# Patient Record
Sex: Female | Born: 1945 | Race: White | Hispanic: No | Smoking: Current every day smoker
Health system: Southern US, Community
[De-identification: ages and names within clinical notes are randomized; demographics above are authoritative.]

## PROBLEM LIST (undated history)

## (undated) DIAGNOSIS — I779 Disorder of arteries and arterioles, unspecified: Secondary | ICD-10-CM

## (undated) DIAGNOSIS — I2510 Atherosclerotic heart disease of native coronary artery without angina pectoris: Secondary | ICD-10-CM

## (undated) DIAGNOSIS — G4733 Obstructive sleep apnea (adult) (pediatric): Secondary | ICD-10-CM

## (undated) DIAGNOSIS — I10 Essential (primary) hypertension: Secondary | ICD-10-CM

## (undated) DIAGNOSIS — J449 Chronic obstructive pulmonary disease, unspecified: Secondary | ICD-10-CM

## (undated) DIAGNOSIS — N182 Chronic kidney disease, stage 2 (mild): Secondary | ICD-10-CM

## (undated) DIAGNOSIS — I5189 Other ill-defined heart diseases: Secondary | ICD-10-CM

## (undated) DIAGNOSIS — M1990 Unspecified osteoarthritis, unspecified site: Secondary | ICD-10-CM

## (undated) DIAGNOSIS — K429 Umbilical hernia without obstruction or gangrene: Secondary | ICD-10-CM

## (undated) DIAGNOSIS — G5602 Carpal tunnel syndrome, left upper limb: Secondary | ICD-10-CM

## (undated) DIAGNOSIS — H44001 Unspecified purulent endophthalmitis, right eye: Secondary | ICD-10-CM

## (undated) DIAGNOSIS — E039 Hypothyroidism, unspecified: Secondary | ICD-10-CM

## (undated) DIAGNOSIS — K219 Gastro-esophageal reflux disease without esophagitis: Secondary | ICD-10-CM

## (undated) DIAGNOSIS — F32A Depression, unspecified: Secondary | ICD-10-CM

## (undated) DIAGNOSIS — E785 Hyperlipidemia, unspecified: Secondary | ICD-10-CM

## (undated) DIAGNOSIS — N189 Chronic kidney disease, unspecified: Secondary | ICD-10-CM

## (undated) DIAGNOSIS — I700 Atherosclerosis of aorta: Secondary | ICD-10-CM

## (undated) DIAGNOSIS — R7303 Prediabetes: Secondary | ICD-10-CM

## (undated) DIAGNOSIS — G2581 Restless legs syndrome: Secondary | ICD-10-CM

## (undated) DIAGNOSIS — F329 Major depressive disorder, single episode, unspecified: Secondary | ICD-10-CM

## (undated) DIAGNOSIS — Z789 Other specified health status: Secondary | ICD-10-CM

## (undated) DIAGNOSIS — M797 Fibromyalgia: Secondary | ICD-10-CM

## (undated) HISTORY — DX: Hyperlipidemia, unspecified: E78.5

## (undated) HISTORY — DX: Depression, unspecified: F32.A

## (undated) HISTORY — DX: Major depressive disorder, single episode, unspecified: F32.9

## (undated) HISTORY — PX: HERNIA REPAIR: SHX51

## (undated) HISTORY — DX: Unspecified osteoarthritis, unspecified site: M19.90

## (undated) HISTORY — PX: CHOLECYSTECTOMY: SHX55

## (undated) HISTORY — PX: COLONOSCOPY: SHX174

## (undated) HISTORY — DX: Essential (primary) hypertension: I10

## (undated) HISTORY — PX: TONSILLECTOMY: SUR1361

## (undated) HISTORY — PX: APPENDECTOMY: SHX54

## (undated) HISTORY — DX: Gastro-esophageal reflux disease without esophagitis: K21.9

## (undated) HISTORY — DX: Chronic kidney disease, unspecified: N18.9

## (undated) HISTORY — PX: ABDOMINAL HYSTERECTOMY: SHX81

---

## 2012-01-12 ENCOUNTER — Ambulatory Visit: Payer: Self-pay | Admitting: Internal Medicine

## 2013-05-27 ENCOUNTER — Ambulatory Visit: Payer: Self-pay | Admitting: Family Medicine

## 2015-05-17 DIAGNOSIS — I10 Essential (primary) hypertension: Secondary | ICD-10-CM | POA: Insufficient documentation

## 2015-05-17 DIAGNOSIS — E039 Hypothyroidism, unspecified: Secondary | ICD-10-CM | POA: Insufficient documentation

## 2015-05-17 DIAGNOSIS — F329 Major depressive disorder, single episode, unspecified: Secondary | ICD-10-CM | POA: Insufficient documentation

## 2015-05-17 DIAGNOSIS — E785 Hyperlipidemia, unspecified: Secondary | ICD-10-CM | POA: Insufficient documentation

## 2015-05-17 DIAGNOSIS — F32A Depression, unspecified: Secondary | ICD-10-CM | POA: Insufficient documentation

## 2015-05-18 ENCOUNTER — Encounter: Payer: Self-pay | Admitting: Family Medicine

## 2015-05-18 ENCOUNTER — Ambulatory Visit (INDEPENDENT_AMBULATORY_CARE_PROVIDER_SITE_OTHER): Payer: Medicare Other | Admitting: Family Medicine

## 2015-05-18 VITALS — BP 117/73 | HR 85 | Temp 98.5°F | Ht 64.0 in | Wt 162.0 lb

## 2015-05-18 DIAGNOSIS — G4733 Obstructive sleep apnea (adult) (pediatric): Secondary | ICD-10-CM | POA: Insufficient documentation

## 2015-05-18 DIAGNOSIS — Z9989 Dependence on other enabling machines and devices: Secondary | ICD-10-CM

## 2015-05-18 DIAGNOSIS — I10 Essential (primary) hypertension: Secondary | ICD-10-CM | POA: Diagnosis not present

## 2015-05-18 DIAGNOSIS — F329 Major depressive disorder, single episode, unspecified: Secondary | ICD-10-CM

## 2015-05-18 DIAGNOSIS — F32A Depression, unspecified: Secondary | ICD-10-CM

## 2015-05-18 NOTE — Progress Notes (Signed)
   BP 117/73 mmHg  Pulse 85  Temp(Src) 98.5 F (36.9 C)  Ht 5\' 4"  (1.626 m)  Wt 162 lb (73.483 kg)  BMI 27.79 kg/m2  SpO2 95%   Subjective:    Patient ID: Sherri Peters, female    DOB: October 01, 1946, 69 y.o.   MRN: [id number]  HPI: Sherri Peters is a 69 y.o. female  Chief Complaint  Patient presents with  . Hypertension  chronic headaches resolved with tx of sleep apnea Not taking any headache meds at all Is having some artheritis c/o Using cpap every night  Relevant past medical, surgical, family and social history reviewed and updated as indicated. Interim medical history since our last visit reviewed. Allergies and medications reviewed and updated.  Review of Systems  Per HPI unless specifically indicated above     Objective:    BP 117/73 mmHg  Pulse 85  Temp(Src) 98.5 F (36.9 C)  Ht 5\' 4"  (1.626 m)  Wt 162 lb (73.483 kg)  BMI 27.79 kg/m2  SpO2 95%  Wt Readings from Last 3 Encounters:  05/18/15 162 lb (73.483 kg)  02/11/15 155 lb (70.308 kg)    Physical Exam  No results found for this or any previous visit.    Assessment & Plan:   Problem List Items Addressed This Visit      Respiratory   OSA on CPAP     Other   Depression    The current medical regimen is effective;  continue present plan and medications.        Other Visit Diagnoses    Essential hypertension, benign    -  Primary    Relevant Orders    Basic metabolic panel        Follow up plan: Return in about 3 months (around 08/18/2015) for Physical Exam.

## 2015-05-18 NOTE — Assessment & Plan Note (Signed)
The current medical regimen is effective;  continue present plan and medications.  

## 2015-05-19 LAB — BASIC METABOLIC PANEL
BUN/Creatinine Ratio: 16 (ref 11–26)
BUN: 20 mg/dL (ref 8–27)
CHLORIDE: 102 mmol/L (ref 97–108)
CO2: 22 mmol/L (ref 18–29)
Calcium: 9.4 mg/dL (ref 8.7–10.3)
Creatinine, Ser: 1.24 mg/dL — ABNORMAL HIGH (ref 0.57–1.00)
GFR calc non Af Amer: 45 mL/min/{1.73_m2} — ABNORMAL LOW (ref >59)
GFR, EST AFRICAN AMERICAN: 52 mL/min/{1.73_m2} — AB (ref >59)
Glucose: 111 mg/dL — ABNORMAL HIGH (ref 65–99)
Potassium: 4.1 mmol/L (ref 3.5–5.2)
Sodium: 140 mmol/L (ref 134–144)

## 2015-05-21 ENCOUNTER — Other Ambulatory Visit: Payer: Self-pay | Admitting: Family Medicine

## 2015-05-24 ENCOUNTER — Telehealth: Payer: Self-pay | Admitting: Family Medicine

## 2015-05-24 MED ORDER — TELMISARTAN-HCTZ 80-12.5 MG PO TABS: 1.0000 | ORAL_TABLET | Freq: Every day | ORAL | Status: DC

## 2015-05-24 NOTE — Telephone Encounter (Signed)
E-fax came through for refill: Rx: telmisartan-hydrochlorothiazide (MICARDIS HCT) 80-12.5 MG per tablet Copy in basket

## 2015-05-25 ENCOUNTER — Telehealth: Payer: Self-pay

## 2015-05-25 MED ORDER — ATORVASTATIN CALCIUM 40 MG PO TABS: 40.0000 mg | ORAL_TABLET | Freq: Every day | ORAL | Status: DC

## 2015-05-25 NOTE — Telephone Encounter (Signed)
Requesting Atorvastatin  Tab QHS #90

## 2015-08-05 ENCOUNTER — Other Ambulatory Visit: Payer: Self-pay | Admitting: Family Medicine

## 2015-08-10 ENCOUNTER — Telehealth: Payer: Self-pay

## 2015-08-10 NOTE — Telephone Encounter (Signed)
[facility] requesting 90 day rx Omeprazole Cap 

## 2015-08-11 MED ORDER — OMEPRAZOLE 20 MG PO CPDR: 20.0000 mg | DELAYED_RELEASE_CAPSULE | Freq: Every day | ORAL | Status: DC

## 2015-08-17 ENCOUNTER — Encounter: Payer: Self-pay | Admitting: Family Medicine

## 2015-08-17 ENCOUNTER — Ambulatory Visit (INDEPENDENT_AMBULATORY_CARE_PROVIDER_SITE_OTHER): Payer: Medicare Other | Admitting: Family Medicine

## 2015-08-17 VITALS — BP 98/62 | HR 80 | Temp 99.0°F | Ht 63.0 in | Wt 167.0 lb

## 2015-08-17 DIAGNOSIS — F32A Depression, unspecified: Secondary | ICD-10-CM

## 2015-08-17 DIAGNOSIS — E785 Hyperlipidemia, unspecified: Secondary | ICD-10-CM

## 2015-08-17 DIAGNOSIS — F329 Major depressive disorder, single episode, unspecified: Secondary | ICD-10-CM | POA: Diagnosis not present

## 2015-08-17 DIAGNOSIS — I10 Essential (primary) hypertension: Secondary | ICD-10-CM

## 2015-08-17 DIAGNOSIS — Z0000 Encounter for general adult medical examination without abnormal findings: Secondary | ICD-10-CM

## 2015-08-17 DIAGNOSIS — E039 Hypothyroidism, unspecified: Secondary | ICD-10-CM

## 2015-08-17 DIAGNOSIS — Z23 Encounter for immunization: Secondary | ICD-10-CM

## 2015-08-17 LAB — URINALYSIS, ROUTINE W REFLEX MICROSCOPIC
BILIRUBIN UA: NEGATIVE
Glucose, UA: NEGATIVE
KETONES UA: NEGATIVE
LEUKOCYTES UA: NEGATIVE
NITRITE UA: NEGATIVE
PH UA: 5 (ref 5.0–7.5)
PROTEIN UA: NEGATIVE
RBC UA: NEGATIVE
SPEC GRAV UA: 1.015 (ref 1.005–1.030)
Urobilinogen, Ur: 0.2 mg/dL (ref 0.2–1.0)

## 2015-08-17 MED ORDER — CITALOPRAM HYDROBROMIDE 40 MG PO TABS: 40.0000 mg | ORAL_TABLET | Freq: Every day | ORAL | Status: DC

## 2015-08-17 MED ORDER — TELMISARTAN-HCTZ 80-12.5 MG PO TABS: ORAL_TABLET | ORAL | Status: DC

## 2015-08-17 MED ORDER — ATORVASTATIN CALCIUM 40 MG PO TABS: 40.0000 mg | ORAL_TABLET | Freq: Every day | ORAL | Status: DC

## 2015-08-17 MED ORDER — LEVOTHYROXINE SODIUM 75 MCG PO TABS: 75.0000 ug | ORAL_TABLET | Freq: Every day | ORAL | Status: DC

## 2015-08-17 NOTE — Assessment & Plan Note (Signed)
The current medical regimen is effective;  continue present plan and medications.  

## 2015-08-17 NOTE — Progress Notes (Signed)
BP 98/62 mmHg  Pulse 80  Temp(Src) 99 F (37.2 C)  Ht  (1.6 m)  Wt 167 lb (75.751 kg)  BMI 29.59 kg/m2  SpO2 96%   Subjective:    Patient ID: Sherri Peters, female    DOB: [birth year], 69 y.o.   MRN: [id number]  HPI: Sherri Peters is a 69 y.o. female  Chief Complaint  Patient presents with  . Annual Exam  AWV metrics meet Asian primarily concerned about some right facial and right ear symptoms of stopped up and some pain has been ongoing for really this morning with some soreness developing yesterday. No trauma or other irritation. Other medical issues and medications doing well taking medications as listed in med sheet without problems and taken faithfully. Nerves depression doing well allergies well controlled thyroid no complaints or symptoms blood pressure cholesterol all doing well.   Relevant past medical, surgical, family and social history reviewed and updated as indicated. Interim medical history since our last visit reviewed. Allergies and medications reviewed and updated.  Review of Systems  Constitutional: Negative.   HENT: Negative.   Eyes: Negative.   Respiratory: Negative.   Cardiovascular: Negative.   Gastrointestinal: Negative.   Endocrine: Negative.   Genitourinary: Negative.   Musculoskeletal: Negative.   Skin: Negative.   Allergic/Immunologic: Negative.   Neurological: Negative.   Hematological: Negative.   Psychiatric/Behavioral: Negative.     Per HPI unless specifically indicated above     Objective:    BP 98/62 mmHg  Pulse 80  Temp(Src) 99 F (37.2 C)  Ht  (1.6 m)  Wt 167 lb (75.751 kg)  BMI 29.59 kg/m2  SpO2 96%  Wt Readings from Last 3 Encounters:  08/17/15 167 lb (75.751 kg)  05/18/15 162 lb (73.483 kg)  02/11/15 155 lb (70.308 kg)    Physical Exam  Constitutional: She is oriented to person, place, and time. She appears well-developed and well-nourished.  HENT:  Head: Normocephalic and atraumatic.  Right Ear: External  ear normal.  Left Ear: External ear normal.  Nose: Nose normal.  Mouth/Throat: Oropharynx is clear and moist.  Eyes: Conjunctivae and EOM are normal. Pupils are equal, round, and reactive to light.  Neck: Normal range of motion. Neck supple. Carotid bruit is not present.  Cardiovascular: Normal rate, regular rhythm and normal heart sounds.   No murmur heard. Pulmonary/Chest: Effort normal and breath sounds normal.  Breast exam refused by pt as she does her own  Abdominal: Soft. Bowel sounds are normal. There is no hepatosplenomegaly.  Musculoskeletal: Normal range of motion.  Neurological: She is alert and oriented to person, place, and time.  Skin: No rash noted.  Psychiatric: She has a normal mood and affect. Her behavior is normal. Judgment and thought content normal.    Results for orders placed or performed in visit on 05/18/15  Basic metabolic panel  Result Value Ref Range   Glucose 111 (H) 65 - 99 mg/dL   BUN 20 8 - 27 mg/dL   Creatinine, Ser 4.09 (H) 0.57 - 1.00 mg/dL   GFR calc non Af Amer 45 (L) >59 mL/min/1.73   GFR calc Af Amer 52 (L) >59 mL/min/1.73   BUN/Creatinine Ratio 16 11 - 26   Sodium 140 134 - 144 mmol/L   Potassium 4.1 3.5 - 5.2 mmol/L   Chloride 102 97 - 108 mmol/L   CO2 22 18 - 29 mmol/L   Calcium 9.4 8.7 - 10.3 mg/dL      Assessment &  Plan:   Problem List Items Addressed This Visit      Cardiovascular and Mediastinum   Hypertension    The current medical regimen is effective;  continue present plan and medications.       Relevant Medications   atorvastatin (LIPITOR) 40 MG tablet   telmisartan-hydrochlorothiazide (MICARDIS HCT) 80-12.5 MG tablet   Other Relevant Orders   Comprehensive metabolic panel   CBC with Differential/Platelet   Urinalysis, Routine w reflex microscopic (not at [facility])     Endocrine   Hypothyroid    The current medical regimen is effective;  continue present plan and medications.       Relevant Medications    levothyroxine (SYNTHROID, LEVOTHROID) 75 MCG tablet   Other Relevant Orders   Comprehensive metabolic panel   CBC with Differential/Platelet   Urinalysis, Routine w reflex microscopic (not at [facility])   TSH     Other   Hyperlipidemia    The current medical regimen is effective;  continue present plan and medications.       Relevant Medications   atorvastatin (LIPITOR) 40 MG tablet   telmisartan-hydrochlorothiazide (MICARDIS HCT) 80-12.5 MG tablet   Other Relevant Orders   Comprehensive metabolic panel   Lipid panel   Depression    The current medical regimen is effective;  continue present plan and medications.       Relevant Medications   citalopram (CELEXA) 40 MG tablet   Other Relevant Orders   Comprehensive metabolic panel   CBC with Differential/Platelet   Urinalysis, Routine w reflex microscopic (not at [facility])    Other Visit Diagnoses    Immunization due    -  Primary    Relevant Orders    Flu Vaccine QUAD 36+ mos PF IM (Fluarix & Fluzone Quad PF) (Completed)    PE (physical exam), annual            Follow up plan: Return in about 6 months (around 02/15/2016), or if symptoms worsen or fail to improve, for Follow-up medication with BMP, lipid panel, ALT, AST.Marland Kitchen

## 2015-08-18 LAB — CBC WITH DIFFERENTIAL/PLATELET
Basophils Absolute: 0 10*3/uL (ref 0.0–0.2)
Basos: 0 %
EOS (ABSOLUTE): 0.1 10*3/uL (ref 0.0–0.4)
Eos: 1 %
Hematocrit: 39.1 % (ref 34.0–46.6)
Hemoglobin: 12.9 g/dL (ref 11.1–15.9)
IMMATURE GRANULOCYTES: 0 %
Immature Grans (Abs): 0 10*3/uL (ref 0.0–0.1)
LYMPHS ABS: 1.9 10*3/uL (ref 0.7–3.1)
Lymphs: 33 %
MCH: 30.6 pg (ref 26.6–33.0)
MCHC: 33 g/dL (ref 31.5–35.7)
MCV: 93 fL (ref 79–97)
MONOS ABS: 0.6 10*3/uL (ref 0.1–0.9)
Monocytes: 11 %
NEUTROS PCT: 55 %
Neutrophils Absolute: 3.2 10*3/uL (ref 1.4–7.0)
PLATELETS: 247 10*3/uL (ref 150–379)
RBC: 4.21 x10E6/uL (ref 3.77–5.28)
RDW: 14.6 % (ref 12.3–15.4)
WBC: 5.8 10*3/uL (ref 3.4–10.8)

## 2015-08-18 LAB — TSH: TSH: 2.2 u[IU]/mL (ref 0.450–4.500)

## 2015-08-18 LAB — COMPREHENSIVE METABOLIC PANEL
A/G RATIO: 2.1 (ref 1.1–2.5)
ALT: 23 IU/L (ref 0–32)
AST: 24 IU/L (ref 0–40)
Albumin: 4.4 g/dL (ref 3.6–4.8)
Alkaline Phosphatase: 61 IU/L (ref 39–117)
BUN / CREAT RATIO: 14 (ref 11–26)
BUN: 21 mg/dL (ref 8–27)
Bilirubin Total: <0.2 mg/dL (ref 0.0–1.2)
CALCIUM: 9.9 mg/dL (ref 8.7–10.3)
CO2: 23 mmol/L (ref 18–29)
Chloride: 101 mmol/L (ref 97–108)
Creatinine, Ser: 1.45 mg/dL — ABNORMAL HIGH (ref 0.57–1.00)
GFR, EST AFRICAN AMERICAN: 43 mL/min/{1.73_m2} — AB (ref >59)
GFR, EST NON AFRICAN AMERICAN: 37 mL/min/{1.73_m2} — AB (ref >59)
GLUCOSE: 102 mg/dL — AB (ref 65–99)
Globulin, Total: 2.1 g/dL (ref 1.5–4.5)
Potassium: 4.5 mmol/L (ref 3.5–5.2)
Sodium: 139 mmol/L (ref 134–144)
TOTAL PROTEIN: 6.5 g/dL (ref 6.0–8.5)

## 2015-08-18 LAB — LIPID PANEL
Chol/HDL Ratio: 2.9 ratio units (ref 0.0–4.4)
Cholesterol, Total: 152 mg/dL (ref 100–199)
HDL: 52 mg/dL (ref >39)
LDL CALC: 61 mg/dL (ref 0–99)
Triglycerides: 195 mg/dL — ABNORMAL HIGH (ref 0–149)
VLDL CHOLESTEROL CAL: 39 mg/dL (ref 5–40)

## 2015-08-19 ENCOUNTER — Telehealth: Payer: Self-pay | Admitting: Family Medicine

## 2015-08-19 ENCOUNTER — Encounter: Payer: Self-pay | Admitting: Family Medicine

## 2015-08-19 DIAGNOSIS — N183 Chronic kidney disease, stage 3 (moderate): Secondary | ICD-10-CM

## 2015-08-19 NOTE — Telephone Encounter (Signed)
-----   Message from Lurlean HornsNancy H Wilson, CMA sent at 08/19/2015  5:06 PM EDT ----- labs

## 2015-08-19 NOTE — Telephone Encounter (Signed)
Phone call Discussed with patient declining renal function. Patient has been using Advil and Aleve aspirin patient will stop using these medications recheck BMP in 1 month or so

## 2015-08-23 ENCOUNTER — Encounter: Payer: Self-pay | Admitting: Family Medicine

## 2015-08-23 ENCOUNTER — Telehealth: Payer: Self-pay | Admitting: Family Medicine

## 2015-08-23 ENCOUNTER — Ambulatory Visit (INDEPENDENT_AMBULATORY_CARE_PROVIDER_SITE_OTHER): Payer: Medicare Other | Admitting: Family Medicine

## 2015-08-23 VITALS — BP 114/71 | HR 84 | Temp 98.8°F | Ht 63.3 in | Wt 167.0 lb

## 2015-08-23 DIAGNOSIS — B029 Zoster without complications: Secondary | ICD-10-CM | POA: Diagnosis not present

## 2015-08-23 MED ORDER — VALACYCLOVIR HCL 1 G PO TABS: 1000.0000 mg | ORAL_TABLET | Freq: Three times a day (TID) | ORAL | Status: DC

## 2015-08-23 NOTE — Telephone Encounter (Signed)
Pt added to MAC's schedule @ 10:30am today for an acute visit. Thanks.

## 2015-08-23 NOTE — Progress Notes (Signed)
BP 114/71 mmHg  Pulse 84  Temp(Src) 98.8 F (37.1 C)  Ht 5' 3.3" (1.608 m)  Wt 167 lb (75.751 kg)  BMI 29.30 kg/m2  SpO2 95%   Subjective:    Patient ID: Sherri Peters, female    DOB: Feb 28, 1946, 69 y.o.   MRN: [id number]  HPI: Sherri Peters is a 69 y.o. female  Chief Complaint  Patient presents with  . Rash    possible shingles   patient developed rash in area of her ear about 2 days after last visit. Started his big welts and inflamed patches around the ear and into scalp. The discomfort has continued. As has the rash is gotten more prominent and red. There are no water blisters or crusting at this point.  Patient has a history of taking prednisone and Levaquin for upper respiratory infection and having hallucinations. Severe reluctant because of potential problems with prednisone to take any further prednisone.   Relevant past medical, surgical, family and social history reviewed and updated as indicated. Interim medical history since our last visit reviewed. Allergies and medications reviewed and updated.  Review of Systems  Constitutional:       All in all just feels bad  HENT:       Shooting pain into her right neck and around ear  Respiratory: Negative.   Cardiovascular: Negative.     Per HPI unless specifically indicated above     Objective:    BP 114/71 mmHg  Pulse 84  Temp(Src) 98.8 F (37.1 C)  Ht 5' 3.3" (1.608 m)  Wt 167 lb (75.751 kg)  BMI 29.30 kg/m2  SpO2 95%  Wt Readings from Last 3 Encounters:  08/23/15 167 lb (75.751 kg)  08/17/15 167 lb (75.751 kg)  05/18/15 162 lb (73.483 kg)    Physical Exam  Constitutional: She is oriented to person, place, and time. She appears well-developed and well-nourished. No distress.  HENT:  Head: Normocephalic and atraumatic.  Right Ear: Hearing normal.  Left Ear: Hearing normal.  Nose: Nose normal.  Eyes: Conjunctivae and lids are normal. Right eye exhibits no discharge. Left eye exhibits no discharge. No  scleral icterus.  Pulmonary/Chest: Effort normal. No respiratory distress.  Musculoskeletal: Normal range of motion.  Neurological: She is alert and oriented to person, place, and time.  Skin: Skin is intact. No rash noted.  Around right ear in front is a red patch posterior ear and scalp area red patch with no blistering  Psychiatric: She has a normal mood and affect. Her speech is normal and behavior is normal. Judgment and thought content normal. Cognition and memory are normal.    Results for orders placed or performed in visit on 08/17/15  Comprehensive metabolic panel  Result Value Ref Range   Glucose 102 (H) 65 - 99 mg/dL   BUN 21 8 - 27 mg/dL   Creatinine, Ser 0.861.45 (H) 0.57 - 1.00 mg/dL   GFR calc non Af Amer 37 (L) >59 mL/min/1.73   GFR calc Af Amer 43 (L) >59 mL/min/1.73   BUN/Creatinine Ratio 14 11 - 26   Sodium 139 134 - 144 mmol/L   Potassium 4.5 3.5 - 5.2 mmol/L   Chloride 101 97 - 108 mmol/L   CO2 23 18 - 29 mmol/L   Calcium 9.9 8.7 - 10.3 mg/dL   Total Protein 6.5 6.0 - 8.5 g/dL   Albumin 4.4 3.6 - 4.8 g/dL   Globulin, Total 2.1 1.5 - 4.5 g/dL   Albumin/Globulin Ratio 2.1 1.1 -  2.5   Bilirubin Total <0.2 0.0 - 1.2 mg/dL   Alkaline Phosphatase 61 39 - 117 IU/L   AST 24 0 - 40 IU/L   ALT 23 0 - 32 IU/L  Lipid panel  Result Value Ref Range   Cholesterol, Total 152 100 - 199 mg/dL   Triglycerides 161 (H) 0 - 149 mg/dL   HDL 52 >09 mg/dL   VLDL Cholesterol Cal 39 5 - 40 mg/dL   LDL Calculated 61 0 - 99 mg/dL   Chol/HDL Ratio 2.9 0.0 - 4.4 ratio units  CBC with Differential/Platelet  Result Value Ref Range   WBC 5.8 3.4 - 10.8 x10E3/uL   RBC 4.21 3.77 - 5.28 x10E6/uL   Hemoglobin 12.9 11.1 - 15.9 g/dL   Hematocrit 60.4 54.0 - 46.6 %   MCV 93 79 - 97 fL   MCH 30.6 26.6 - 33.0 pg   MCHC 33.0 31.5 - 35.7 g/dL   RDW 98.1 19.1 - 47.8 %   Platelets 247 150 - 379 x10E3/uL   Neutrophils 55 %   Lymphs 33 %   Monocytes 11 %   Eos 1 %   Basos 0 %   Neutrophils  Absolute 3.2 1.4 - 7.0 x10E3/uL   Lymphocytes Absolute 1.9 0.7 - 3.1 x10E3/uL   Monocytes Absolute 0.6 0.1 - 0.9 x10E3/uL   EOS (ABSOLUTE) 0.1 0.0 - 0.4 x10E3/uL   Basophils Absolute 0.0 0.0 - 0.2 x10E3/uL   Immature Granulocytes 0 %   Immature Grans (Abs) 0.0 0.0 - 0.1 x10E3/uL  Urinalysis, Routine w reflex microscopic (not at [facility])  Result Value Ref Range   Specific Gravity, UA 1.015 1.005 - 1.030   pH, UA 5.0 5.0 - 7.5   Color, UA Yellow Yellow   Appearance Ur Cloudy (A) Clear   Leukocytes, UA Negative Negative   Protein, UA Negative Negative/Trace   Glucose, UA Negative Negative   Ketones, UA Negative Negative   RBC, UA Negative Negative   Bilirubin, UA Negative Negative   Urobilinogen, Ur 0.2 0.2 - 1.0 mg/dL   Nitrite, UA Negative Negative  TSH  Result Value Ref Range   TSH 2.200 0.450 - 4.500 uIU/mL      Assessment & Plan:   Problem List Items Addressed This Visit      Other   Shingles - Primary    Discussed shingles care and treatment. That she is contagious for chickenpox and avoid pregnant women and children. To be sure and wash her hands after touching her scalp and rash area. Discussed treatment will not use prednisone because of history of possible allergic reaction to prednisone. We will use acyclovir but understands its late in the process and Radilla not be very effective. Patient has continued problems with discomfort [place] need referral to pain clinic.       Relevant Medications   valACYclovir (VALTREX) 1000 MG tablet       Follow up plan: Return if symptoms worsen or fail to improve, for As scheduled and if problems.Marland Kitchen

## 2015-08-23 NOTE — Assessment & Plan Note (Signed)
Discussed shingles care and treatment. That she is contagious for chickenpox and avoid pregnant women and children. To be sure and wash her hands after touching her scalp and rash area. Discussed treatment will not use prednisone because of history of possible allergic reaction to prednisone. We will use acyclovir but understands its late in the process and [place] not be very effective. Patient has continued problems with discomfort [place] need referral to pain clinic.

## 2015-10-11 ENCOUNTER — Telehealth: Payer: Self-pay | Admitting: Family Medicine

## 2015-10-11 NOTE — Telephone Encounter (Signed)
Pt says MAC wanted to recheck kidney function but she wasn't sure if she needed a actual appt with him or just blood work.

## 2015-10-18 ENCOUNTER — Other Ambulatory Visit: Payer: Medicare Other

## 2015-10-18 DIAGNOSIS — N183 Chronic kidney disease, stage 3 (moderate): Secondary | ICD-10-CM

## 2015-10-19 ENCOUNTER — Encounter: Payer: Self-pay | Admitting: Family Medicine

## 2015-10-19 LAB — BASIC METABOLIC PANEL
BUN / CREAT RATIO: 16 (ref 11–26)
BUN: 18 mg/dL (ref 8–27)
CALCIUM: 9 mg/dL (ref 8.7–10.3)
CHLORIDE: 103 mmol/L (ref 96–106)
CO2: 21 mmol/L (ref 18–29)
CREATININE: 1.12 mg/dL — AB (ref 0.57–1.00)
GFR, EST AFRICAN AMERICAN: 59 mL/min/{1.73_m2} — AB (ref >59)
GFR, EST NON AFRICAN AMERICAN: 51 mL/min/{1.73_m2} — AB (ref >59)
Glucose: 101 mg/dL — ABNORMAL HIGH (ref 65–99)
POTASSIUM: 4.1 mmol/L (ref 3.5–5.2)
SODIUM: 141 mmol/L (ref 134–144)

## 2015-12-14 ENCOUNTER — Ambulatory Visit (INDEPENDENT_AMBULATORY_CARE_PROVIDER_SITE_OTHER): Payer: Medicare Other | Admitting: Family Medicine

## 2015-12-14 ENCOUNTER — Encounter: Payer: Self-pay | Admitting: Family Medicine

## 2015-12-14 VITALS — BP 129/72 | HR 66 | Temp 98.3°F | Ht 63.0 in | Wt 174.0 lb

## 2015-12-14 DIAGNOSIS — M1710 Unilateral primary osteoarthritis, unspecified knee: Secondary | ICD-10-CM

## 2015-12-14 DIAGNOSIS — G2581 Restless legs syndrome: Secondary | ICD-10-CM | POA: Insufficient documentation

## 2015-12-14 DIAGNOSIS — M129 Arthropathy, unspecified: Secondary | ICD-10-CM | POA: Diagnosis not present

## 2015-12-14 MED ORDER — MELOXICAM 15 MG PO TABS
15.0000 mg | ORAL_TABLET | Freq: Every day | ORAL | Status: DC
Start: 2015-12-14 — End: 2016-03-22

## 2015-12-14 MED ORDER — GABAPENTIN 300 MG PO CAPS: 300.0000 mg | ORAL_CAPSULE | Freq: Four times a day (QID) | ORAL | Status: DC

## 2015-12-14 NOTE — Assessment & Plan Note (Signed)
Discuss arthritis care and treatment will start meloxicam

## 2015-12-14 NOTE — Progress Notes (Signed)
BP 129/72 mmHg  Pulse 66  Temp(Src) 98.3 F (36.8 C)  Ht  (1.6 m)  Wt 174 lb (78.926 kg)  BMI 30.83 kg/m2  SpO2 99%   Subjective:    Patient ID: Sherri Peters, female    DOB: 04/02/1946, 70 y.o.   MRN: [id number]  HPI: Sherri Peters is a 70 y.o. female  Chief Complaint  Patient presents with  . referral for rheumatology  . lesion on right side of face   pt with 5 years of restless legs maybe getting worse is ready to do something about it  Has not tried any medications at this point legs especially during the day are restless and jump   patient's knees are also sore and stiff as not tried any arthritis medicine.   Area on patint's right cheek with chickenpox now with some slight scarring  Relevant past medical, surgical, family and social history reviewed and updated as indicated. Interim medical history since our last visit reviewed. Allergies and medications reviewed and updated.  Review of Systems  Constitutional: Negative.   Respiratory: Negative.   Cardiovascular: Negative.     Per HPI unless specifically indicated above     Objective:    BP 129/72 mmHg  Pulse 66  Temp(Src) 98.3 F (36.8 C)  Ht  (1.6 m)  Wt 174 lb (78.926 kg)  BMI 30.83 kg/m2  SpO2 99%  Wt Readings from Last 3 Encounters:  12/14/15 174 lb (78.926 kg)  08/23/15 167 lb (75.751 kg)  08/17/15 167 lb (75.751 kg)    Physical Exam  Constitutional: She is oriented to person, place, and time. She appears well-developed and well-nourished. No distress.  HENT:  Head: Normocephalic and atraumatic.  Right Ear: Hearing normal.  Left Ear: Hearing normal.  Nose: Nose normal.  Eyes: Conjunctivae and lids are normal. Right eye exhibits no discharge. Left eye exhibits no discharge. No scleral icterus.  Cardiovascular: Normal rate, regular rhythm and normal heart sounds.   Pulmonary/Chest: Effort normal and breath sounds normal. No respiratory distress.  Musculoskeletal: Normal range of motion.   Patient's legs during exam continue to jump and wiggle  Neurological: She is alert and oriented to person, place, and time.  Skin: Skin is intact. No rash noted.  Psychiatric: She has a normal mood and affect. Her speech is normal and behavior is normal. Judgment and thought content normal. Cognition and memory are normal.    Results for orders placed or performed in visit on 10/18/15  Basic metabolic panel  Result Value Ref Range   Glucose 101 (H) 65 - 99 mg/dL   BUN 18 8 - 27 mg/dL   Creatinine, Ser 4.09 (H) 0.57 - 1.00 mg/dL   GFR calc non Af Amer 51 (L) >59 mL/min/1.73   GFR calc Af Amer 59 (L) >59 mL/min/1.73   BUN/Creatinine Ratio 16 11 - 26   Sodium 141 134 - 144 mmol/L   Potassium 4.1 3.5 - 5.2 mmol/L   Chloride 103 96 - 106 mmol/L   CO2 21 18 - 29 mmol/L   Calcium 9.0 8.7 - 10.3 mg/dL      Assessment & Plan:   Problem List Items Addressed This Visit      Musculoskeletal and Integument   Arthritis of knee    Discuss arthritis care and treatment will start meloxicam      Relevant Medications   meloxicam (MOBIC) 15 MG tablet     Other   Restless leg syndrome - Primary  Discuss restless legs care and treatment will start gabapentin          Follow up plan: Return in about 3 months (around 03/12/2016) for med check.

## 2015-12-14 NOTE — Assessment & Plan Note (Signed)
Discuss restless legs care and treatment will start gabapentin

## 2015-12-27 ENCOUNTER — Telehealth: Payer: Self-pay | Admitting: Family Medicine

## 2015-12-27 MED ORDER — PREGABALIN 75 MG PO CAPS: 75.0000 mg | ORAL_CAPSULE | Freq: Two times a day (BID) | ORAL | Status: DC

## 2015-12-27 NOTE — Telephone Encounter (Signed)
Pt called and stated that her legs and feet are swelling and she feels like it Vara be due to a medication. She would like a call back to discuss other options.

## 2015-12-27 NOTE — Telephone Encounter (Signed)
Phone call Discussed with patient stopped gabapentin even though it was helping her restless legs due to leg swelling. With stopping medication legs are about back to normal. Will start Lyrica

## 2015-12-28 ENCOUNTER — Other Ambulatory Visit: Payer: Self-pay | Admitting: Family Medicine

## 2015-12-28 MED ORDER — PREGABALIN 75 MG PO CAPS: 75.0000 mg | ORAL_CAPSULE | Freq: Two times a day (BID) | ORAL | Status: DC

## 2016-02-15 ENCOUNTER — Ambulatory Visit: Payer: Medicare Other | Admitting: Family Medicine

## 2016-03-13 ENCOUNTER — Other Ambulatory Visit: Payer: Self-pay | Admitting: Family Medicine

## 2016-03-22 ENCOUNTER — Encounter: Payer: Self-pay | Admitting: Family Medicine

## 2016-03-22 ENCOUNTER — Ambulatory Visit (INDEPENDENT_AMBULATORY_CARE_PROVIDER_SITE_OTHER): Payer: Medicare Other | Admitting: Family Medicine

## 2016-03-22 VITALS — BP 119/76 | HR 84 | Temp 98.0°F | Ht 63.0 in | Wt 176.0 lb

## 2016-03-22 DIAGNOSIS — F329 Major depressive disorder, single episode, unspecified: Secondary | ICD-10-CM

## 2016-03-22 DIAGNOSIS — E785 Hyperlipidemia, unspecified: Secondary | ICD-10-CM

## 2016-03-22 DIAGNOSIS — M1710 Unilateral primary osteoarthritis, unspecified knee: Secondary | ICD-10-CM

## 2016-03-22 DIAGNOSIS — E039 Hypothyroidism, unspecified: Secondary | ICD-10-CM

## 2016-03-22 DIAGNOSIS — F32A Depression, unspecified: Secondary | ICD-10-CM

## 2016-03-22 DIAGNOSIS — M129 Arthropathy, unspecified: Secondary | ICD-10-CM | POA: Diagnosis not present

## 2016-03-22 DIAGNOSIS — G2581 Restless legs syndrome: Secondary | ICD-10-CM | POA: Diagnosis not present

## 2016-03-22 DIAGNOSIS — I10 Essential (primary) hypertension: Secondary | ICD-10-CM

## 2016-03-22 DIAGNOSIS — J019 Acute sinusitis, unspecified: Secondary | ICD-10-CM | POA: Diagnosis not present

## 2016-03-22 MED ORDER — MELOXICAM 15 MG PO TABS: 15.0000 mg | ORAL_TABLET | Freq: Every day | ORAL | Status: DC

## 2016-03-22 MED ORDER — PREGABALIN 100 MG PO CAPS: 100.0000 mg | ORAL_CAPSULE | Freq: Three times a day (TID) | ORAL | Status: DC

## 2016-03-22 MED ORDER — TELMISARTAN-HCTZ 80-12.5 MG PO TABS: 1.0000 | ORAL_TABLET | Freq: Every day | ORAL | Status: DC

## 2016-03-22 MED ORDER — AZITHROMYCIN 250 MG PO TABS: ORAL_TABLET | ORAL | Status: DC

## 2016-03-22 NOTE — Assessment & Plan Note (Signed)
Chest restless legs will increase Lyrica 200 mg twice a day patient Edinger increase to 300 mg a day if needed.

## 2016-03-22 NOTE — Addendum Note (Signed)
Addended byVonita Moss: Floye Fesler on: 03/22/2016 04:00 PM   Modules accepted: Kipp BroodSmartSet

## 2016-03-22 NOTE — Assessment & Plan Note (Signed)
The current medical regimen is effective;  continue present plan and medications.  

## 2016-03-22 NOTE — Progress Notes (Addendum)
BP 119/76 mmHg  Pulse 84  Temp(Src) 98 F (36.7 C)  Ht  (1.6 m)  Wt 176 lb (79.833 kg)  BMI 31.18 kg/m2  SpO2 98%   Subjective:    Patient ID: Sherri Peters, female    DOB: [birth year], 70 y.o.   MRN: [id number]  HPI: Sherri Peters is a 70 y.o. female  Chief Complaint  Patient presents with  . URI  . restless legs    med check   Patient with multiple concerns today.  Head cold congestion drainage facial pressure cough is been ongoing for 3 weeks now marked systemic symptoms especially some fatigue no real fever and just achiness no known tick exposure  Restless legs helps somewhat with Lyrica wanted to know if something else can be done maybe increase medications.  Medicine review Lipitor, Celexa, thyroid, all doing well no complaints from medications taken faithfully without side effects  Meloxicam for knee arthritis doing okay at best still having knee pain wants to have handicap sticker for shopping which is okay.  Blood pressure doing well with medications good blood pressure control no complaints from medications.  Relevant past medical, surgical, family and social history reviewed and updated as indicated. Interim medical history since our last visit reviewed. Allergies and medications reviewed and updated.  Review of Systems  Constitutional: Positive for fever, diaphoresis and fatigue.  HENT: Positive for congestion, postnasal drip, rhinorrhea, sinus pressure, sneezing and sore throat.   Respiratory: Positive for cough.   Cardiovascular: Negative.     Per HPI unless specifically indicated above     Objective:    BP 119/76 mmHg  Pulse 84  Temp(Src) 98 F (36.7 C)  Ht  (1.6 m)  Wt 176 lb (79.833 kg)  BMI 31.18 kg/m2  SpO2 98%  Wt Readings from Last 3 Encounters:  03/22/16 176 lb (79.833 kg)  12/14/15 174 lb (78.926 kg)  08/23/15 167 lb (75.751 kg)    Physical Exam  Constitutional: She is oriented to person, place, and time. She appears  well-developed and well-nourished. No distress.  HENT:  Head: Normocephalic and atraumatic.  Right Ear: Hearing and external ear normal.  Left Ear: Hearing and external ear normal.  Nose: Nose normal.  Mouth/Throat: Oropharyngeal exudate present.  Eyes: Conjunctivae and lids are normal. Right eye exhibits no discharge. Left eye exhibits no discharge. No scleral icterus.  Cardiovascular: Normal rate, regular rhythm and normal heart sounds.   Pulmonary/Chest: Effort normal. No respiratory distress.  Slight wheezing with deep breathing  Musculoskeletal: Normal range of motion.  Lymphadenopathy:    She has no cervical adenopathy.  Neurological: She is alert and oriented to person, place, and time.  Skin: Skin is intact. No rash noted.  Psychiatric: She has a normal mood and affect. Her speech is normal and behavior is normal. Judgment and thought content normal. Cognition and memory are normal.    Results for orders placed or performed in visit on 10/18/15  Basic metabolic panel  Result Value Ref Range   Glucose 101 (H) 65 - 99 mg/dL   BUN 18 8 - 27 mg/dL   Creatinine, Ser 0.86 (H) 0.57 - 1.00 mg/dL   GFR calc non Af Amer 51 (L) >59 mL/min/1.73   GFR calc Af Amer 59 (L) >59 mL/min/1.73   BUN/Creatinine Ratio 16 11 - 26   Sodium 141 134 - 144 mmol/L   Potassium 4.1 3.5 - 5.2 mmol/L   Chloride 103 96 - 106 mmol/L   CO2  21 18 - 29 mmol/L   Calcium 9.0 8.7 - 10.3 mg/dL      Assessment & Plan:   Problem List Items Addressed This Visit      Cardiovascular and Mediastinum   Hypertension - Primary    The current medical regimen is effective;  continue present plan and medications.       Relevant Medications   telmisartan-hydrochlorothiazide (MICARDIS HCT) 80-12.5 MG tablet   Other Relevant Orders   Basic metabolic panel     Endocrine   Hypothyroid     Musculoskeletal and Integument   Arthritis of knee    The current medical regimen is effective;  continue present plan and  medications.       Relevant Medications   meloxicam (MOBIC) 15 MG tablet     Other   Hyperlipidemia    The current medical regimen is effective;  continue present plan and medications.       Relevant Medications   telmisartan-hydrochlorothiazide (MICARDIS HCT) 80-12.5 MG tablet   Other Relevant Orders   LP+ALT+AST Piccolo, Waived   Depression    The current medical regimen is effective;  continue present plan and medications.       Restless leg syndrome    Chest restless legs will increase Lyrica 200 mg twice a day patient Karam increase to 300 mg a day if needed.      Relevant Medications   pregabalin (LYRICA) 100 MG capsule    Other Visit Diagnoses    Acute sinusitis, recurrence not specified, unspecified location        Discussed care and treatment of sinusitis use of medications over-the-counter medications nasal rinse fluids Tylenol etc.    Relevant Medications    azithromycin (ZITHROMAX) 250 MG tablet        Follow up plan: Return in about 6 months (around 09/22/2016) for Physical Exam.

## 2016-05-15 ENCOUNTER — Telehealth: Payer: Self-pay | Admitting: Family Medicine

## 2016-05-15 NOTE — Telephone Encounter (Signed)
Called patient for clarity  She says that Meloxicam is causing swelling in her feet, not as bad as when she was taking gabapentin but swelling.  She stopped Meloxicam for about a week and the swelling went down, but pain was worsening so she has started taking it again.  She wants to know if there is an alternative to the Meloxicam to help with her arthritis

## 2016-05-15 NOTE — Telephone Encounter (Signed)
LVM for patient to try Aleve, per MJ  She has appointment in October but if Aleve causes swelling, come in earlier for visit

## 2016-05-15 NOTE — Telephone Encounter (Signed)
Pt called and said she would like to know if there is anything else she can try besides meloxicam. She stated that her feet and legs don't swell as bad taking this as they did when she took the prednisone but when she stopped taking it for a couple days the swelling went down.

## 2016-05-15 NOTE — Telephone Encounter (Signed)
She can try OTC aleve BID. Stop if making her feet swell. Please make sure she has a follow up with MAC when he gets back.

## 2016-05-31 ENCOUNTER — Other Ambulatory Visit: Payer: Self-pay | Admitting: Family Medicine

## 2016-05-31 ENCOUNTER — Telehealth: Payer: Self-pay | Admitting: Family Medicine

## 2016-05-31 MED ORDER — DICLOFENAC SODIUM 1 % TD GEL: 2.0000 g | Freq: Four times a day (QID) | TRANSDERMAL | 12 refills | Status: DC

## 2016-05-31 NOTE — Telephone Encounter (Signed)
Call pt 

## 2016-05-31 NOTE — Telephone Encounter (Signed)
Discussed with patient's arthritis care treatment over-the-counter medications including Tylenol will try diclofenac gel also

## 2016-05-31 NOTE — Telephone Encounter (Signed)
Pt would like to know if there is anything else she can take for her arthritis.Meloxicam, gabapentin and aleve all make her feet and legs swell. Pt uses [facility] as pharmacy.

## 2016-06-07 ENCOUNTER — Encounter: Payer: Self-pay | Admitting: Family Medicine

## 2016-06-07 ENCOUNTER — Ambulatory Visit (INDEPENDENT_AMBULATORY_CARE_PROVIDER_SITE_OTHER): Payer: Medicare Other | Admitting: Family Medicine

## 2016-06-07 VITALS — BP 95/62 | HR 93 | Temp 98.6°F | Ht 63.3 in | Wt 183.0 lb

## 2016-06-07 DIAGNOSIS — G5602 Carpal tunnel syndrome, left upper limb: Secondary | ICD-10-CM | POA: Insufficient documentation

## 2016-06-07 DIAGNOSIS — I10 Essential (primary) hypertension: Secondary | ICD-10-CM

## 2016-06-07 NOTE — Assessment & Plan Note (Signed)
The current medical regimen is effective;  continue present plan and medications.  

## 2016-06-07 NOTE — Progress Notes (Signed)
BP 95/62 (BP Location: Right Arm, Cuff Size: Normal)   Pulse 93   Temp 98.6 F (37 C)   Ht 5' 3.3" (1.608 m)   Wt 183 lb (83 kg)   SpO2 96%   BMI 32.11 kg/m    Subjective:    Patient ID: Sherri Peters, female    DOB: Roberge 08, 1947, 70 y.o.   MRN: [id number]  HPI: Sherri Peters is a 70 y.o. female  Chief Complaint  Patient presents with  . Leg Swelling    better this week  . Possible umbilical hernia  . left arm has been tingling   Patient with bilateral leg swelling left greater than right no known trauma irritation is gotten better this week patient again some weight and is eating better with decreased salt. PND no orthopnea no nocturia.  Patient in addition concerned about bags under her eyes and Yellen be related to CPAP reviewed will continue CPAP discussed trial of allergy medicine first Claritin and Allegra and Nasacort Flonase using one of the pills and one of the nose sprays faithfully for a month or so if problems persist will consider referral to sleeps specialist.  Left arm tingle numbness on further review its first 3 fingers anterior surface comes on during the day  Patient with occasional abdominal discomfort will notice umbilical hernia present as able to massage her back in that is gone with relief of symptoms this is been ongoing for a month or so.  Relevant past medical, surgical, family and social history reviewed and updated as indicated. Interim medical history since our last visit reviewed. Allergies and medications reviewed and updated.  Review of Systems  Constitutional: Negative.   Respiratory: Negative.   Cardiovascular: Negative.     Per HPI unless specifically indicated above     Objective:    BP 95/62 (BP Location: Right Arm, Cuff Size: Normal)   Pulse 93   Temp 98.6 F (37 C)   Ht 5' 3.3" (1.608 m)   Wt 183 lb (83 kg)   SpO2 96%   BMI 32.11 kg/m   Wt Readings from Last 3 Encounters:  06/07/16 183 lb (83 kg)  03/22/16 176 lb (79.8 kg)    12/14/15 174 lb (78.9 kg)    Physical Exam  Constitutional: She is oriented to person, place, and time. She appears well-developed and well-nourished. No distress.  HENT:  Head: Normocephalic and atraumatic.  Right Ear: Hearing normal.  Left Ear: Hearing normal.  Nose: Nose normal.  Eyes: Conjunctivae and lids are normal. Right eye exhibits no discharge. Left eye exhibits no discharge. No scleral icterus.  Cardiovascular: Normal rate, regular rhythm and normal heart sounds.   Pulmonary/Chest: Effort normal. No respiratory distress.  Abdominal:  History of umbilical hernia  Musculoskeletal: Normal range of motion.  Phalen sign positive Tinel's negative  Neurological: She is alert and oriented to person, place, and time.  Skin: Skin is intact. No rash noted.  Psychiatric: She has a normal mood and affect. Her speech is normal and behavior is normal. Judgment and thought content normal. Cognition and memory are normal.    Results for orders placed or performed in visit on 10/18/15  Basic metabolic panel  Result Value Ref Range   Glucose 101 (H) 65 - 99 mg/dL   BUN 18 8 - 27 mg/dL   Creatinine, Ser 9.53 (H) 0.57 - 1.00 mg/dL   GFR calc non Af Amer 51 (L) >59 mL/min/1.73   GFR calc Af Amer 59 (L) >59  mL/min/1.73   BUN/Creatinine Ratio 16 11 - 26   Sodium 141 134 - 144 mmol/L   Potassium 4.1 3.5 - 5.2 mmol/L   Chloride 103 96 - 106 mmol/L   CO2 21 18 - 29 mmol/L   Calcium 9.0 8.7 - 10.3 mg/dL      Assessment & Plan:   Problem List Items Addressed This Visit      Cardiovascular and Mediastinum   Hypertension    The current medical regimen is effective;  continue present plan and medications.         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      Follow up plan: Return in about 3 months (around 09/07/2016) for Physical Exam.

## 2016-06-07 NOTE — Assessment & Plan Note (Signed)
Discussed splints and care

## 2016-07-24 ENCOUNTER — Telehealth: Payer: Self-pay | Admitting: Family Medicine

## 2016-07-24 DIAGNOSIS — K429 Umbilical hernia without obstruction or gangrene: Secondary | ICD-10-CM

## 2016-07-24 NOTE — Telephone Encounter (Signed)
Patient's umbilical hernia is gotten bad enough wants to have repair will refer to surgery

## 2016-07-24 NOTE — Telephone Encounter (Signed)
Pt called would like a referral for a surgeon at [facility] for her umbilical hernia. Pt stated Dr. Dossie Arbourrissman would know what this is about. Please call pt if any further information is needed. Thanks.

## 2016-07-25 ENCOUNTER — Encounter (INDEPENDENT_AMBULATORY_CARE_PROVIDER_SITE_OTHER): Payer: Self-pay

## 2016-08-24 ENCOUNTER — Ambulatory Visit (INDEPENDENT_AMBULATORY_CARE_PROVIDER_SITE_OTHER): Payer: Medicare Other | Admitting: Family Medicine

## 2016-08-24 ENCOUNTER — Encounter: Payer: Self-pay | Admitting: Family Medicine

## 2016-08-24 VITALS — BP 122/79 | HR 71 | Temp 98.2°F | Wt 190.0 lb

## 2016-08-24 DIAGNOSIS — E039 Hypothyroidism, unspecified: Secondary | ICD-10-CM

## 2016-08-24 DIAGNOSIS — G2581 Restless legs syndrome: Secondary | ICD-10-CM

## 2016-08-24 DIAGNOSIS — Z0000 Encounter for general adult medical examination without abnormal findings: Secondary | ICD-10-CM

## 2016-08-24 DIAGNOSIS — I10 Essential (primary) hypertension: Secondary | ICD-10-CM

## 2016-08-24 DIAGNOSIS — E7800 Pure hypercholesterolemia, unspecified: Secondary | ICD-10-CM

## 2016-08-24 DIAGNOSIS — F3342 Major depressive disorder, recurrent, in full remission: Secondary | ICD-10-CM

## 2016-08-24 LAB — URINALYSIS, ROUTINE W REFLEX MICROSCOPIC
Bilirubin, UA: NEGATIVE
Glucose, UA: NEGATIVE
Ketones, UA: NEGATIVE
LEUKOCYTES UA: NEGATIVE
NITRITE UA: NEGATIVE
PH UA: 5.5 (ref 5.0–7.5)
PROTEIN UA: NEGATIVE
RBC, UA: NEGATIVE
SPEC GRAV UA: 1.015 (ref 1.005–1.030)
Urobilinogen, Ur: 0.2 mg/dL (ref 0.2–1.0)

## 2016-08-24 MED ORDER — TELMISARTAN-HCTZ 80-12.5 MG PO TABS: 1.0000 | ORAL_TABLET | Freq: Every day | ORAL | 4 refills | Status: DC

## 2016-08-24 MED ORDER — CITALOPRAM HYDROBROMIDE 40 MG PO TABS: 40.0000 mg | ORAL_TABLET | Freq: Every day | ORAL | 4 refills | Status: DC

## 2016-08-24 MED ORDER — LEVOTHYROXINE SODIUM 75 MCG PO TABS: 75.0000 ug | ORAL_TABLET | Freq: Every day | ORAL | 4 refills | Status: DC

## 2016-08-24 MED ORDER — ATORVASTATIN CALCIUM 40 MG PO TABS: 40.0000 mg | ORAL_TABLET | Freq: Every day | ORAL | 4 refills | Status: DC

## 2016-08-24 MED ORDER — OMEPRAZOLE 20 MG PO CPDR: 20.0000 mg | DELAYED_RELEASE_CAPSULE | Freq: Every day | ORAL | 4 refills | Status: DC

## 2016-08-24 MED ORDER — PREGABALIN 100 MG PO CAPS: 100.0000 mg | ORAL_CAPSULE | Freq: Three times a day (TID) | ORAL | 1 refills | Status: DC

## 2016-08-24 NOTE — Progress Notes (Signed)
BP 122/79   Pulse 71   Temp 98.2 F (36.8 C)   Wt 190 lb (86.2 kg)   SpO2 96%   BMI 33.34 kg/m    Subjective:    Patient ID: Sherri Peters, female    DOB: 1946-01-16, 70 y.o.   MRN: [id number]  HPI: Sherri Peters is a 70 y.o. female  Chief Complaint  Patient presents with  . 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 Has a big cruise coming up next week hoping to have something done. Has taken meloxicam which doesn't help that much and causes leg swelling. Has tried diclofenac gel which hasn't done much. has wondered about a shot. Reviewed other medications cholesterol depression thyroid reflux blood pressure all stable taking faithfully without side effects.  Relevant past medical, surgical, family and social history reviewed and updated as indicated. Interim medical history since our last visit reviewed. Allergies and medications reviewed and updated.  Review of Systems  Constitutional: Negative.   HENT: Negative.   Eyes: Negative.   Respiratory: Negative.   Cardiovascular: Negative.   Gastrointestinal: Negative.   Endocrine: Negative.   Genitourinary: Negative.   Musculoskeletal: Negative.   Skin: Negative.   Allergic/Immunologic: Negative.   Neurological: Negative.   Hematological: Negative.   Psychiatric/Behavioral: Negative.     Per HPI unless specifically indicated above     Objective:    BP 122/79   Pulse 71   Temp 98.2 F (36.8 C)   Wt 190 lb (86.2 kg)   SpO2 96%   BMI 33.34 kg/m   Wt Readings from Last 3 Encounters:  08/24/16 190 lb (86.2 kg)  06/07/16 183 lb (83 kg)  03/22/16 176 lb (79.8 kg)    Physical Exam  Constitutional: She is oriented to person, place, and time. She appears well-developed and well-nourished.  HENT:  Head: Normocephalic and atraumatic.  Right Ear: External ear normal.  Left Ear: External ear normal.  Nose: Nose normal.  Mouth/Throat: Oropharynx  is clear and moist.  Eyes: Conjunctivae and EOM are normal. Pupils are equal, round, and reactive to light.  Neck: Normal range of motion. Neck supple. Carotid bruit is not present.  Cardiovascular: Normal rate, regular rhythm and normal heart sounds.   No murmur heard. Pulmonary/Chest: Effort normal and breath sounds normal. She exhibits no mass. Right breast exhibits no mass, no skin change and no tenderness. Left breast exhibits no mass, no skin change and no tenderness. Breasts are symmetrical.  Abdominal: Soft. Bowel sounds are normal. There is no hepatosplenomegaly.  Musculoskeletal: Normal range of motion.  Patient knees with DJD changes prepped with Betadine and alcohol both knees infiltrated with Marcaine and Kenalog. Patient tolerated procedure well experienced some relief.  Also with bilateral carpal tunnel syndrome left greater than right Phalen sign positive patient education given on wrist splints.  Neurological: She is alert and oriented to person, place, and time.  Skin: No rash noted.  Psychiatric: She has a normal mood and affect. Her behavior is normal. Judgment and thought content normal.    Results for orders placed or performed in visit on 04/29/75  Basic metabolic panel  Result Value Ref Range   Glucose 101 (H) 65 - 99 mg/dL   BUN 18 8 - 27 mg/dL   Creatinine, Ser 1.12 (H) 0.57 - 1.00 mg/dL   GFR calc non Af Amer 51 (L) >59 mL/min/1.73   GFR calc Af Amer 59 (  L) >59 mL/min/1.73   BUN/Creatinine Ratio 16 11 - 26   Sodium 141 134 - 144 mmol/L   Potassium 4.1 3.5 - 5.2 mmol/L   Chloride 103 96 - 106 mmol/L   CO2 21 18 - 29 mmol/L   Calcium 9.0 8.7 - 10.3 mg/dL      Assessment & Plan:   Problem List Items Addressed This Visit      Cardiovascular and Mediastinum   Hypertension   Relevant Medications   telmisartan-hydrochlorothiazide (MICARDIS HCT) 80-12.5 MG tablet   atorvastatin (LIPITOR) 40 MG tablet     Endocrine   Hypothyroid    The current medical  regimen is effective;  continue present plan and medications.       Relevant Medications   levothyroxine (SYNTHROID, LEVOTHROID) 75 MCG tablet     Other   Restless leg syndrome (Chronic)   Relevant Medications   pregabalin (LYRICA) 100 MG capsule   Hyperlipidemia   Relevant Medications   telmisartan-hydrochlorothiazide (MICARDIS HCT) 80-12.5 MG tablet   atorvastatin (LIPITOR) 40 MG tablet   Depression   Relevant Medications   citalopram (CELEXA) 40 MG tablet    Other Visit Diagnoses    Annual physical exam    -  Primary   Relevant Orders   CBC w/Diff   Comp Met (CMET)   Urinalysis, Routine w reflex microscopic   TSH   Lipid panel       Follow up plan: Return in about 6 months (around 02/22/2017).

## 2016-08-24 NOTE — Assessment & Plan Note (Signed)
The current medical regimen is effective;  continue present plan and medications.  

## 2016-08-25 LAB — LIPID PANEL
CHOL/HDL RATIO: 2.4 ratio (ref 0.0–4.4)
Cholesterol, Total: 145 mg/dL (ref 100–199)
HDL: 61 mg/dL (ref >39)
LDL CALC: 65 mg/dL (ref 0–99)
TRIGLYCERIDES: 94 mg/dL (ref 0–149)
VLDL Cholesterol Cal: 19 mg/dL (ref 5–40)

## 2016-08-25 LAB — CBC WITH DIFFERENTIAL/PLATELET
BASOS ABS: 0 10*3/uL (ref 0.0–0.2)
Basos: 0 %
EOS (ABSOLUTE): 0.1 10*3/uL (ref 0.0–0.4)
Eos: 2 %
HEMOGLOBIN: 12 g/dL (ref 11.1–15.9)
Hematocrit: 37.2 % (ref 34.0–46.6)
Immature Grans (Abs): 0 10*3/uL (ref 0.0–0.1)
Immature Granulocytes: 0 %
LYMPHS ABS: 2.1 10*3/uL (ref 0.7–3.1)
Lymphs: 36 %
MCH: 31.3 pg (ref 26.6–33.0)
MCHC: 32.3 g/dL (ref 31.5–35.7)
MCV: 97 fL (ref 79–97)
MONOCYTES: 12 %
Monocytes Absolute: 0.7 10*3/uL (ref 0.1–0.9)
Neutrophils Absolute: 2.8 10*3/uL (ref 1.4–7.0)
Neutrophils: 50 %
Platelets: 293 10*3/uL (ref 150–379)
RBC: 3.84 x10E6/uL (ref 3.77–5.28)
RDW: 15.2 % (ref 12.3–15.4)
WBC: 5.7 10*3/uL (ref 3.4–10.8)

## 2016-08-25 LAB — COMPREHENSIVE METABOLIC PANEL
ALBUMIN: 4.1 g/dL (ref 3.6–4.8)
ALK PHOS: 67 IU/L (ref 39–117)
ALT: 22 IU/L (ref 0–32)
AST: 18 IU/L (ref 0–40)
Albumin/Globulin Ratio: 1.7 (ref 1.2–2.2)
BUN/Creatinine Ratio: 21 (ref 12–28)
BUN: 23 mg/dL (ref 8–27)
Bilirubin Total: 0.2 mg/dL (ref 0.0–1.2)
CO2: 25 mmol/L (ref 18–29)
CREATININE: 1.08 mg/dL — AB (ref 0.57–1.00)
Calcium: 9.3 mg/dL (ref 8.7–10.3)
Chloride: 103 mmol/L (ref 96–106)
GFR calc Af Amer: 61 mL/min/{1.73_m2} (ref >59)
GFR calc non Af Amer: 53 mL/min/{1.73_m2} — ABNORMAL LOW (ref >59)
GLUCOSE: 92 mg/dL (ref 65–99)
Globulin, Total: 2.4 g/dL (ref 1.5–4.5)
Potassium: 4.8 mmol/L (ref 3.5–5.2)
Sodium: 144 mmol/L (ref 134–144)
Total Protein: 6.5 g/dL (ref 6.0–8.5)

## 2016-08-25 LAB — TSH: TSH: 2.93 u[IU]/mL (ref 0.450–4.500)

## 2016-08-28 ENCOUNTER — Encounter: Payer: Self-pay | Admitting: Family Medicine

## 2016-09-25 ENCOUNTER — Telehealth: Payer: Self-pay | Admitting: Family Medicine

## 2016-09-25 DIAGNOSIS — M25561 Pain in right knee: Principal | ICD-10-CM

## 2016-09-25 DIAGNOSIS — M25562 Pain in left knee: Principal | ICD-10-CM

## 2016-09-25 DIAGNOSIS — G8929 Other chronic pain: Secondary | ICD-10-CM

## 2016-09-25 NOTE — Telephone Encounter (Signed)
Pt called would like to know if she can come in and have injections in both knees or if it is still too soon. Please call pt to inform and schedule. Thanks.

## 2016-09-25 NOTE — Telephone Encounter (Signed)
To soon, we can refer to orthopedics if she would like.

## 2016-09-25 NOTE — Telephone Encounter (Signed)
Routing to provider  

## 2016-09-26 NOTE — Telephone Encounter (Signed)
Patient notified. She'd like to go to [facility]. Referral placed.

## 2016-10-09 ENCOUNTER — Telehealth: Payer: Self-pay | Admitting: Family Medicine

## 2016-10-10 NOTE — Telephone Encounter (Signed)
Left patient a message to return my phone call.

## 2016-10-16 ENCOUNTER — Ambulatory Visit: Payer: Medicare Other | Admitting: Podiatry

## 2016-10-16 NOTE — Telephone Encounter (Signed)
Patient wanted to know her [facility] appointment again.  Appointment given.

## 2016-10-17 ENCOUNTER — Other Ambulatory Visit: Payer: Self-pay | Admitting: Family Medicine

## 2016-10-17 DIAGNOSIS — M1710 Unilateral primary osteoarthritis, unspecified knee: Secondary | ICD-10-CM

## 2016-10-18 DIAGNOSIS — M79644 Pain in right finger(s): Secondary | ICD-10-CM | POA: Insufficient documentation

## 2016-10-18 DIAGNOSIS — G8929 Other chronic pain: Secondary | ICD-10-CM | POA: Insufficient documentation

## 2016-10-18 DIAGNOSIS — M810 Age-related osteoporosis without current pathological fracture: Secondary | ICD-10-CM | POA: Insufficient documentation

## 2016-11-10 HISTORY — PX: UMBILICAL HERNIA REPAIR: SHX2598

## 2017-02-22 ENCOUNTER — Ambulatory Visit: Payer: Medicare Other | Admitting: Family Medicine

## 2017-03-07 ENCOUNTER — Encounter: Payer: Self-pay | Admitting: Family Medicine

## 2017-03-07 ENCOUNTER — Ambulatory Visit (INDEPENDENT_AMBULATORY_CARE_PROVIDER_SITE_OTHER): Payer: Medicare Other | Admitting: Family Medicine

## 2017-03-07 DIAGNOSIS — G2581 Restless legs syndrome: Secondary | ICD-10-CM

## 2017-03-07 DIAGNOSIS — M1710 Unilateral primary osteoarthritis, unspecified knee: Secondary | ICD-10-CM | POA: Diagnosis not present

## 2017-03-07 DIAGNOSIS — I10 Essential (primary) hypertension: Secondary | ICD-10-CM | POA: Diagnosis not present

## 2017-03-07 DIAGNOSIS — E7800 Pure hypercholesterolemia, unspecified: Secondary | ICD-10-CM

## 2017-03-07 MED ORDER — NABUMETONE 500 MG PO TABS: 500.0000 mg | ORAL_TABLET | Freq: Two times a day (BID) | ORAL | 1 refills | Status: DC

## 2017-03-07 MED ORDER — PREGABALIN 100 MG PO CAPS: 100.0000 mg | ORAL_CAPSULE | Freq: Three times a day (TID) | ORAL | 1 refills | Status: DC

## 2017-03-07 NOTE — Assessment & Plan Note (Signed)
The current medical regimen is effective;  continue present plan and medications.  

## 2017-03-07 NOTE — Progress Notes (Signed)
BP 125/78   Pulse 77   Wt 208 lb (94.3 kg)   SpO2 96%   BMI 36.50 kg/m    Subjective:    Patient ID: Sherri Peters, female    DOB: Diantonio 21, 1947, 71 y.o.   MRN: [id number]  HPI: Sherri Peters is a 71 y.o. female  Chief Complaint  Patient presents with  . Follow-up  . Weight Check    First visit w/ Dr. Jamal Collin in [facility] this afternoon  . Nail Problem    Both great toes - check.   Patient follow-up concerned about weight gain has been having problems started smoking again because she is so unhappy with craving cigarettes and nicotine all the time. Patient's gained significant weight during this time is getting ready to get a weight loss clinic in [place]. From patient's description that frequently use phentermine discuss weight gain nature of phentermine and cautions. Patient taking Relafen and for arthritis of her knee wants refill Wants Lyrica since 2 optimal Rx will prevent a fresh prescription.  Also concerned has some ingrown toenails she's been working on a currently pain-free but concerned about future  Relevant past medical, surgical, family and social history reviewed and updated as indicated. Interim medical history since our last visit reviewed. Allergies and medications reviewed and updated.  Review of Systems  Constitutional: Negative.   Respiratory: Negative.   Cardiovascular: Negative.     Per HPI unless specifically indicated above     Objective:    BP 125/78   Pulse 77   Wt 208 lb (94.3 kg)   SpO2 96%   BMI 36.50 kg/m   Wt Readings from Last 3 Encounters:  03/07/17 208 lb (94.3 kg)  08/24/16 190 lb (86.2 kg)  06/07/16 183 lb (83 kg)    Physical Exam  Constitutional: She is oriented to person, place, and time. She appears well-developed and well-nourished.  HENT:  Head: Normocephalic and atraumatic.  Eyes: Conjunctivae and EOM are normal.  Neck: Normal range of motion.  Cardiovascular: Normal rate, regular rhythm and normal heart sounds.     Pulmonary/Chest: Effort normal and breath sounds normal.  Musculoskeletal: Normal range of motion.  Neurological: She is alert and oriented to person, place, and time.  Skin: No erythema.  Toenails in good shape for now discussed right medial toenail Odonnel need further intervention in the future but will observe for now.  Psychiatric: She has a normal mood and affect. Her behavior is normal. Judgment and thought content normal.    Results for orders placed or performed in visit on 08/24/16  CBC w/Diff  Result Value Ref Range   WBC 5.7 3.4 - 10.8 x10E3/uL   RBC 3.84 3.77 - 5.28 x10E6/uL   Hemoglobin 12.0 11.1 - 15.9 g/dL   Hematocrit 37.2 34.0 - 46.6 %   MCV 97 79 - 97 fL   MCH 31.3 26.6 - 33.0 pg   MCHC 32.3 31.5 - 35.7 g/dL   RDW 15.2 12.3 - 15.4 %   Platelets 293 150 - 379 x10E3/uL   Neutrophils 50 Not Estab. %   Lymphs 36 Not Estab. %   Monocytes 12 Not Estab. %   Eos 2 Not Estab. %   Basos 0 Not Estab. %   Neutrophils Absolute 2.8 1.4 - 7.0 x10E3/uL   Lymphocytes Absolute 2.1 0.7 - 3.1 x10E3/uL   Monocytes Absolute 0.7 0.1 - 0.9 x10E3/uL   EOS (ABSOLUTE) 0.1 0.0 - 0.4 x10E3/uL   Basophils Absolute 0.0 0.0 - 0.2 x10E3/uL  Immature Granulocytes 0 Not Estab. %   Immature Grans (Abs) 0.0 0.0 - 0.1 x10E3/uL  Comp Met (CMET)  Result Value Ref Range   Glucose 92 65 - 99 mg/dL   BUN 23 8 - 27 mg/dL   Creatinine, Ser 1.08 (H) 0.57 - 1.00 mg/dL   GFR calc non Af Amer 53 (L) >59 mL/min/1.73   GFR calc Af Amer 61 >59 mL/min/1.73   BUN/Creatinine Ratio 21 12 - 28   Sodium 144 134 - 144 mmol/L   Potassium 4.8 3.5 - 5.2 mmol/L   Chloride 103 96 - 106 mmol/L   CO2 25 18 - 29 mmol/L   Calcium 9.3 8.7 - 10.3 mg/dL   Total Protein 6.5 6.0 - 8.5 g/dL   Albumin 4.1 3.6 - 4.8 g/dL   Globulin, Total 2.4 1.5 - 4.5 g/dL   Albumin/Globulin Ratio 1.7 1.2 - 2.2   Bilirubin Total 0.2 0.0 - 1.2 mg/dL   Alkaline Phosphatase 67 39 - 117 IU/L   AST 18 0 - 40 IU/L   ALT 22 0 - 32 IU/L   Urinalysis, Routine w reflex microscopic  Result Value Ref Range   Specific Gravity, UA 1.015 1.005 - 1.030   pH, UA 5.5 5.0 - 7.5   Color, UA Yellow Yellow   Appearance Ur Clear Clear   Leukocytes, UA Negative Negative   Protein, UA Negative Negative/Trace   Glucose, UA Negative Negative   Ketones, UA Negative Negative   RBC, UA Negative Negative   Bilirubin, UA Negative Negative   Urobilinogen, Ur 0.2 0.2 - 1.0 mg/dL   Nitrite, UA Negative Negative  TSH  Result Value Ref Range   TSH 2.930 0.450 - 4.500 uIU/mL  Lipid panel  Result Value Ref Range   Cholesterol, Total 145 100 - 199 mg/dL   Triglycerides 94 0 - 149 mg/dL   HDL 61 >39 mg/dL   VLDL Cholesterol Cal 19 5 - 40 mg/dL   LDL Calculated 65 0 - 99 mg/dL   Chol/HDL Ratio 2.4 0.0 - 4.4 ratio units      Assessment & Plan:   Problem List Items Addressed This Visit      Cardiovascular and Mediastinum   Hypertension    The current medical regimen is effective;  continue present plan and medications.       Relevant Orders   Basic metabolic panel     Musculoskeletal and Integument   Arthritis of knee    The current medical regimen is effective;  continue present plan and medications.       Relevant Medications   nabumetone (RELAFEN) 500 MG tablet     Other   Restless leg syndrome (Chronic)   Relevant Medications   pregabalin (LYRICA) 100 MG capsule   Hyperlipidemia    The current medical regimen is effective;  continue present plan and medications.       Relevant Orders   Lipid Panel w/o Chol/HDL Ratio   ALT   AST       Follow up plan: Return in about 6 months (around 09/07/2017) for Physical Exam.

## 2017-03-08 ENCOUNTER — Telehealth: Payer: Self-pay | Admitting: Family Medicine

## 2017-03-08 DIAGNOSIS — I10 Essential (primary) hypertension: Secondary | ICD-10-CM

## 2017-03-08 LAB — BASIC METABOLIC PANEL
BUN / CREAT RATIO: 18 (ref 12–28)
BUN: 22 mg/dL (ref 8–27)
CHLORIDE: 102 mmol/L (ref 96–106)
CO2: 22 mmol/L (ref 18–29)
Calcium: 9.5 mg/dL (ref 8.7–10.3)
Creatinine, Ser: 1.23 mg/dL — ABNORMAL HIGH (ref 0.57–1.00)
GFR calc non Af Amer: 45 mL/min/{1.73_m2} — ABNORMAL LOW (ref >59)
GFR, EST AFRICAN AMERICAN: 52 mL/min/{1.73_m2} — AB (ref >59)
Glucose: 93 mg/dL (ref 65–99)
Potassium: 4.5 mmol/L (ref 3.5–5.2)
SODIUM: 140 mmol/L (ref 134–144)

## 2017-03-08 LAB — LIPID PANEL W/O CHOL/HDL RATIO
Cholesterol, Total: 157 mg/dL (ref 100–199)
HDL: 45 mg/dL (ref >39)
LDL CALC: 66 mg/dL (ref 0–99)
Triglycerides: 232 mg/dL — ABNORMAL HIGH (ref 0–149)
VLDL Cholesterol Cal: 46 mg/dL — ABNORMAL HIGH (ref 5–40)

## 2017-03-08 LAB — AST: AST: 17 IU/L (ref 0–40)

## 2017-03-08 LAB — ALT: ALT: 17 IU/L (ref 0–32)

## 2017-03-08 NOTE — Telephone Encounter (Signed)
Phone call Discussed with patient slight decline in renal function. Patient taking aspirin every day discuss Will stop aspirin and recheck BMP 1-2 months.

## 2017-05-01 ENCOUNTER — Other Ambulatory Visit: Payer: Medicare Other

## 2017-05-01 DIAGNOSIS — I10 Essential (primary) hypertension: Secondary | ICD-10-CM

## 2017-05-02 ENCOUNTER — Telehealth: Payer: Self-pay | Admitting: Family Medicine

## 2017-05-02 LAB — BASIC METABOLIC PANEL
BUN / CREAT RATIO: 18 (ref 12–28)
BUN: 23 mg/dL (ref 8–27)
CO2: 21 mmol/L (ref 20–29)
CREATININE: 1.26 mg/dL — AB (ref 0.57–1.00)
Calcium: 9.7 mg/dL (ref 8.7–10.3)
Chloride: 96 mmol/L (ref 96–106)
GFR calc Af Amer: 50 mL/min/{1.73_m2} — ABNORMAL LOW (ref >59)
GFR, EST NON AFRICAN AMERICAN: 44 mL/min/{1.73_m2} — AB (ref >59)
Glucose: 84 mg/dL (ref 65–99)
Potassium: 4.9 mmol/L (ref 3.5–5.2)
SODIUM: 135 mmol/L (ref 134–144)

## 2017-05-02 NOTE — Telephone Encounter (Signed)
Phone call Discussed with patient renal function not improving has stopped aspirin patient thinks she is also stopped meloxicam reviewed nonsteroidal anti-inflammatory medications with patient to make sure she is not taking. If renal function still low after next visit this fall will do nephrology appointment.

## 2017-07-03 ENCOUNTER — Other Ambulatory Visit: Payer: Self-pay

## 2017-07-03 DIAGNOSIS — G2581 Restless legs syndrome: Secondary | ICD-10-CM

## 2017-07-03 MED ORDER — PREGABALIN 100 MG PO CAPS: 100.0000 mg | ORAL_CAPSULE | Freq: Three times a day (TID) | ORAL | 1 refills | Status: DC

## 2017-07-03 NOTE — Telephone Encounter (Signed)
  Last routine OV: 03/07/17 Next OV: None on file.

## 2017-08-20 ENCOUNTER — Other Ambulatory Visit: Payer: Self-pay | Admitting: Family Medicine

## 2017-08-20 DIAGNOSIS — E039 Hypothyroidism, unspecified: Secondary | ICD-10-CM

## 2017-08-20 DIAGNOSIS — I10 Essential (primary) hypertension: Secondary | ICD-10-CM

## 2017-08-20 DIAGNOSIS — E7800 Pure hypercholesterolemia, unspecified: Secondary | ICD-10-CM

## 2017-08-20 DIAGNOSIS — F3342 Major depressive disorder, recurrent, in full remission: Secondary | ICD-10-CM

## 2017-08-22 ENCOUNTER — Ambulatory Visit: Payer: Medicare Other

## 2017-08-29 ENCOUNTER — Telehealth: Payer: Self-pay

## 2017-08-29 NOTE — Telephone Encounter (Signed)
Called and left message for patient to reschedule AWV with [facility] and physical with Dr.Crissman. Direct number given for patient [phone]

## 2017-09-04 ENCOUNTER — Encounter: Payer: Medicare Other | Admitting: Family Medicine

## 2017-10-17 ENCOUNTER — Telehealth: Payer: Self-pay

## 2017-10-17 NOTE — Telephone Encounter (Signed)
3rd attempt - called patient to reschedule missed awv/physical. Will send letter

## 2017-11-22 DIAGNOSIS — N1831 Chronic kidney disease, stage 3a: Secondary | ICD-10-CM | POA: Insufficient documentation

## 2017-11-22 DIAGNOSIS — J438 Other emphysema: Secondary | ICD-10-CM | POA: Insufficient documentation

## 2017-11-22 DIAGNOSIS — N183 Chronic kidney disease, stage 3 unspecified: Secondary | ICD-10-CM | POA: Insufficient documentation

## 2017-11-22 DIAGNOSIS — K219 Gastro-esophageal reflux disease without esophagitis: Secondary | ICD-10-CM | POA: Insufficient documentation

## 2017-11-22 DIAGNOSIS — E669 Obesity, unspecified: Secondary | ICD-10-CM | POA: Insufficient documentation

## 2017-12-07 DIAGNOSIS — M1711 Unilateral primary osteoarthritis, right knee: Secondary | ICD-10-CM | POA: Insufficient documentation

## 2017-12-15 ENCOUNTER — Other Ambulatory Visit: Payer: Self-pay | Admitting: Family Medicine

## 2017-12-15 DIAGNOSIS — F3342 Major depressive disorder, recurrent, in full remission: Secondary | ICD-10-CM

## 2017-12-15 DIAGNOSIS — E039 Hypothyroidism, unspecified: Secondary | ICD-10-CM

## 2017-12-15 DIAGNOSIS — I10 Essential (primary) hypertension: Secondary | ICD-10-CM

## 2017-12-15 DIAGNOSIS — E7800 Pure hypercholesterolemia, unspecified: Secondary | ICD-10-CM

## 2017-12-17 NOTE — Telephone Encounter (Signed)
Call pt\\apt 

## 2017-12-28 ENCOUNTER — Encounter: Payer: Self-pay | Admitting: Family Medicine

## 2017-12-28 NOTE — Telephone Encounter (Signed)
LMOM to call back to schedule an appt.

## 2018-01-02 DIAGNOSIS — F321 Major depressive disorder, single episode, moderate: Secondary | ICD-10-CM | POA: Insufficient documentation

## 2018-06-06 DIAGNOSIS — Z9841 Cataract extraction status, right eye: Secondary | ICD-10-CM

## 2018-06-06 HISTORY — DX: Cataract extraction status, right eye: Z98.41

## 2018-06-06 HISTORY — PX: CATARACT EXTRACTION: SUR2

## 2018-07-09 DIAGNOSIS — Z961 Presence of intraocular lens: Secondary | ICD-10-CM | POA: Insufficient documentation

## 2018-07-09 DIAGNOSIS — H44001 Unspecified purulent endophthalmitis, right eye: Secondary | ICD-10-CM | POA: Insufficient documentation

## 2018-09-25 ENCOUNTER — Other Ambulatory Visit: Payer: Self-pay | Admitting: Physical Medicine and Rehabilitation

## 2018-09-25 DIAGNOSIS — M5416 Radiculopathy, lumbar region: Secondary | ICD-10-CM

## 2018-10-10 ENCOUNTER — Other Ambulatory Visit
Admission: RE | Admit: 2018-10-10 | Discharge: 2018-10-10 | Disposition: A | Discharge status: Home / Self Care | Payer: Medicare Other | Source: Home / Self Care | Attending: Pediatrics | Admitting: Pediatrics

## 2018-10-10 ENCOUNTER — Ambulatory Visit
Admission: RE | Admit: 2018-10-10 | Discharge: 2018-10-10 | Disposition: A | Discharge status: Home / Self Care | Payer: Medicare Other | Source: Ambulatory Visit | Attending: Pediatrics | Admitting: Pediatrics

## 2018-10-10 ENCOUNTER — Other Ambulatory Visit: Payer: Self-pay | Admitting: Pediatrics

## 2018-10-10 DIAGNOSIS — R079 Chest pain, unspecified: Secondary | ICD-10-CM

## 2018-10-10 DIAGNOSIS — D71 Functional disorders of polymorphonuclear neutrophils: Secondary | ICD-10-CM | POA: Insufficient documentation

## 2018-10-10 DIAGNOSIS — I700 Atherosclerosis of aorta: Secondary | ICD-10-CM | POA: Insufficient documentation

## 2018-10-10 DIAGNOSIS — J479 Bronchiectasis, uncomplicated: Secondary | ICD-10-CM | POA: Diagnosis not present

## 2018-10-10 DIAGNOSIS — I2510 Atherosclerotic heart disease of native coronary artery without angina pectoris: Secondary | ICD-10-CM | POA: Insufficient documentation

## 2018-10-10 LAB — CREATININE, SERUM
Creatinine, Ser: 1.43 mg/dL — ABNORMAL HIGH (ref 0.44–1.00)
GFR calc Af Amer: 43 mL/min — ABNORMAL LOW (ref >60)
GFR calc non Af Amer: 37 mL/min — ABNORMAL LOW (ref >60)

## 2018-10-10 MED ORDER — IOPAMIDOL (ISOVUE-370) INJECTION 76%
100.0000 mL | Freq: Once | INTRAVENOUS | Status: AC | PRN
  Administered 2018-10-10: 60 mL via INTRAVENOUS

## 2018-10-18 ENCOUNTER — Ambulatory Visit
Admission: RE | Admit: 2018-10-18 | Discharge: 2018-10-18 | Disposition: A | Discharge status: Home / Self Care | Payer: Medicare Other | Source: Ambulatory Visit | Attending: Physical Medicine and Rehabilitation | Admitting: Physical Medicine and Rehabilitation

## 2018-10-18 DIAGNOSIS — M5416 Radiculopathy, lumbar region: Secondary | ICD-10-CM | POA: Diagnosis present

## 2018-10-18 DIAGNOSIS — M419 Scoliosis, unspecified: Secondary | ICD-10-CM | POA: Diagnosis not present

## 2018-10-18 DIAGNOSIS — M48061 Spinal stenosis, lumbar region without neurogenic claudication: Secondary | ICD-10-CM | POA: Diagnosis not present

## 2018-10-18 DIAGNOSIS — M5116 Intervertebral disc disorders with radiculopathy, lumbar region: Secondary | ICD-10-CM | POA: Insufficient documentation

## 2018-11-05 ENCOUNTER — Encounter

## 2018-11-06 HISTORY — PX: EYE SURGERY: SHX253

## 2018-11-22 DIAGNOSIS — R0602 Shortness of breath: Secondary | ICD-10-CM | POA: Insufficient documentation

## 2018-11-22 DIAGNOSIS — I6523 Occlusion and stenosis of bilateral carotid arteries: Secondary | ICD-10-CM | POA: Insufficient documentation

## 2019-05-31 ENCOUNTER — Emergency Department: Payer: Medicare Other

## 2019-05-31 ENCOUNTER — Other Ambulatory Visit: Payer: Self-pay

## 2019-05-31 DIAGNOSIS — E039 Hypothyroidism, unspecified: Secondary | ICD-10-CM | POA: Diagnosis not present

## 2019-05-31 DIAGNOSIS — N189 Chronic kidney disease, unspecified: Secondary | ICD-10-CM | POA: Diagnosis not present

## 2019-05-31 DIAGNOSIS — Z20828 Contact with and (suspected) exposure to other viral communicable diseases: Secondary | ICD-10-CM | POA: Insufficient documentation

## 2019-05-31 DIAGNOSIS — K5790 Diverticulosis of intestine, part unspecified, without perforation or abscess without bleeding: Secondary | ICD-10-CM | POA: Diagnosis not present

## 2019-05-31 DIAGNOSIS — I129 Hypertensive chronic kidney disease with stage 1 through stage 4 chronic kidney disease, or unspecified chronic kidney disease: Secondary | ICD-10-CM | POA: Insufficient documentation

## 2019-05-31 DIAGNOSIS — Z87891 Personal history of nicotine dependence: Secondary | ICD-10-CM | POA: Insufficient documentation

## 2019-05-31 DIAGNOSIS — Z79899 Other long term (current) drug therapy: Secondary | ICD-10-CM | POA: Insufficient documentation

## 2019-05-31 DIAGNOSIS — R0602 Shortness of breath: Secondary | ICD-10-CM | POA: Diagnosis present

## 2019-05-31 LAB — CBC
HCT: 33.4 % — ABNORMAL LOW (ref 36.0–46.0)
Hemoglobin: 10.7 g/dL — ABNORMAL LOW (ref 12.0–15.0)
MCH: 30 pg (ref 26.0–34.0)
MCHC: 32 g/dL (ref 30.0–36.0)
MCV: 93.6 fL (ref 80.0–100.0)
Platelets: 235 10*3/uL (ref 150–400)
RBC: 3.57 MIL/uL — ABNORMAL LOW (ref 3.87–5.11)
RDW: 14.2 % (ref 11.5–15.5)
WBC: 8.8 10*3/uL (ref 4.0–10.5)
nRBC: 0 % (ref 0.0–0.2)

## 2019-05-31 LAB — COMPREHENSIVE METABOLIC PANEL
ALT: 20 U/L (ref 0–44)
AST: 17 U/L (ref 15–41)
Albumin: 3.8 g/dL (ref 3.5–5.0)
Alkaline Phosphatase: 59 U/L (ref 38–126)
Anion gap: 9 (ref 5–15)
BUN: 25 mg/dL — ABNORMAL HIGH (ref 8–23)
CO2: 22 mmol/L (ref 22–32)
Calcium: 9.3 mg/dL (ref 8.9–10.3)
Chloride: 109 mmol/L (ref 98–111)
Creatinine, Ser: 1.29 mg/dL — ABNORMAL HIGH (ref 0.44–1.00)
GFR calc Af Amer: 48 mL/min — ABNORMAL LOW (ref >60)
GFR calc non Af Amer: 42 mL/min — ABNORMAL LOW (ref >60)
Glucose, Bld: 121 mg/dL — ABNORMAL HIGH (ref 70–99)
Potassium: 4.4 mmol/L (ref 3.5–5.1)
Sodium: 140 mmol/L (ref 135–145)
Total Bilirubin: 0.6 mg/dL (ref 0.3–1.2)
Total Protein: 6.7 g/dL (ref 6.5–8.1)

## 2019-05-31 LAB — TROPONIN I (HIGH SENSITIVITY): Troponin I (High Sensitivity): 5 ng/L (ref <18)

## 2019-05-31 NOTE — ED Triage Notes (Signed)
Patient c/o SOB, orthopnea, ULQ abdominal pain, nausea, and diarrhea.

## 2019-06-01 ENCOUNTER — Encounter: Payer: Self-pay | Admitting: Radiology

## 2019-06-01 ENCOUNTER — Emergency Department: Payer: Medicare Other

## 2019-06-01 ENCOUNTER — Emergency Department
Admission: EM | Admit: 2019-06-01 | Discharge: 2019-06-01 | Disposition: A | Discharge status: Home / Self Care | Payer: Medicare Other | Attending: Emergency Medicine | Admitting: Emergency Medicine

## 2019-06-01 DIAGNOSIS — K5730 Diverticulosis of large intestine without perforation or abscess without bleeding: Secondary | ICD-10-CM

## 2019-06-01 LAB — URINALYSIS, COMPLETE (UACMP) WITH MICROSCOPIC
Bilirubin Urine: NEGATIVE
Glucose, UA: NEGATIVE mg/dL
Hgb urine dipstick: NEGATIVE
Ketones, ur: NEGATIVE mg/dL
Leukocytes,Ua: NEGATIVE
Nitrite: NEGATIVE
Protein, ur: NEGATIVE mg/dL
Specific Gravity, Urine: 1.024 (ref 1.005–1.030)
pH: 5 (ref 5.0–8.0)

## 2019-06-01 LAB — LIPASE, BLOOD: Lipase: 36 U/L (ref 11–51)

## 2019-06-01 LAB — SARS CORONAVIRUS 2 BY RT PCR (HOSPITAL ORDER, PERFORMED IN ~~LOC~~ HOSPITAL LAB): SARS Coronavirus 2: NEGATIVE

## 2019-06-01 MED ORDER — AMOXICILLIN-POT CLAVULANATE 875-125 MG PO TABS
1.0000 | ORAL_TABLET | Freq: Once | ORAL | Status: AC
  Administered 2019-06-01: 1 via ORAL
  Filled 2019-06-01: qty 1

## 2019-06-01 MED ORDER — ONDANSETRON 4 MG PO TBDP: 4.0000 mg | ORAL_TABLET | Freq: Three times a day (TID) | ORAL | 0 refills | Status: DC | PRN

## 2019-06-01 MED ORDER — ONDANSETRON HCL 4 MG/2ML IJ SOLN
4.0000 mg | Freq: Once | INTRAMUSCULAR | Status: AC
  Administered 2019-06-01: 05:00:00 4 mg via INTRAVENOUS
  Filled 2019-06-01: qty 2

## 2019-06-01 MED ORDER — ONDANSETRON HCL 4 MG/2ML IJ SOLN
4.0000 mg | Freq: Once | INTRAMUSCULAR | Status: AC
  Administered 2019-06-01: 07:00:00 4 mg via INTRAVENOUS
  Filled 2019-06-01: qty 2

## 2019-06-01 MED ORDER — AMOXICILLIN-POT CLAVULANATE 875-125 MG PO TABS: 1.0000 | ORAL_TABLET | Freq: Two times a day (BID) | ORAL | 0 refills | Status: DC

## 2019-06-01 MED ORDER — SODIUM CHLORIDE 0.9 % IV BOLUS
1000.0000 mL | Freq: Once | INTRAVENOUS | Status: AC
  Administered 2019-06-01: 05:00:00 1000 mL via INTRAVENOUS

## 2019-06-01 MED ORDER — IOHEXOL 300 MG/ML  SOLN
75.0000 mL | Freq: Once | INTRAMUSCULAR | Status: AC | PRN
  Administered 2019-06-01: 05:00:00 100 mL via INTRAVENOUS

## 2019-06-01 NOTE — ED Notes (Signed)
Patient given a warm blanket at this time.  

## 2019-06-01 NOTE — ED Provider Notes (Signed)
[facility] Emergency Department Provider Note    First MD Initiated Contact with Patient 06/01/19 [phone]     (approximate)  I have reviewed the triage vital signs and the nursing notes.   HISTORY  Chief Complaint Shortness of Breath, Diarrhea, and Abdominal Pain    HPI Sherri Peters is a 73 y.o. female with below list of previous medical conditions presents to the emergency department secondary to 2-day history of left upper/lower quadrant abdominal pain nausea and diarrhea.  Patient also admits to dyspnea and occasional cough.  Patient denies any known sick contact.  Patient admits to subjective fevers however afebrile on presentation temperature 98.2.     Past Medical History:  Diagnosis Date  . Arthritis   . Chronic kidney disease   . Depression   . GERD (gastroesophageal reflux disease)   . Hyperlipidemia   . Hypertension     Patient Active Problem List   Diagnosis Date Noted  . Carpal tunnel syndrome of left wrist 06/07/2016  . Restless leg syndrome 12/14/2015  . Arthritis of knee 12/14/2015  . Shingles 08/23/2015  . OSA on CPAP 05/18/2015  . Hyperlipidemia   . Hypothyroid   . Hypertension   . Depression     Past Surgical History:  Procedure Laterality Date  . ABDOMINAL HYSTERECTOMY      Prior to Admission medications   Medication Sig Start Date End Date Taking? Authorizing Provider  amoxicillin-clavulanate (AUGMENTIN) 875-125 MG tablet Take 1 tablet by mouth 2 (two) times daily for 10 days. 06/01/19 06/11/19  Gregor Hams, MD  atorvastatin (LIPITOR) 40 MG tablet TAKE 1 TABLET BY MOUTH  DAILY 12/17/17   Guadalupe Maple, MD  citalopram (CELEXA) 40 MG tablet TAKE 1 TABLET BY MOUTH  DAILY 12/17/17   Guadalupe Maple, MD  fluticasone Asencion Islam) 50 MCG/ACT nasal spray  04/12/15   [provider]  levothyroxine (SYNTHROID, LEVOTHROID) 75 MCG tablet TAKE 1 TABLET BY MOUTH  DAILY 12/17/17   Guadalupe Maple, MD  meloxicam (MOBIC) 15 MG  tablet TAKE 1 TABLET BY MOUTH  DAILY 10/18/16   Guadalupe Maple, MD  nabumetone (RELAFEN) 500 MG tablet Take 1 tablet (500 mg total) by mouth 2 (two) times daily. 03/07/17   Guadalupe Maple, MD  omeprazole (PRILOSEC) 20 MG capsule TAKE 1 CAPSULE BY MOUTH  DAILY 12/17/17   Guadalupe Maple, MD  ondansetron (ZOFRAN ODT) 4 MG disintegrating tablet Take 1 tablet (4 mg total) by mouth every 8 (eight) hours as needed. 06/01/19   Gregor Hams, MD  pregabalin (LYRICA) 100 MG capsule Take 1 capsule (100 mg total) by mouth 3 (three) times daily. 07/03/17   Kathrine Haddock, NP  telmisartan-hydrochlorothiazide (MICARDIS HCT) 80-12.5 MG tablet TAKE 1 TABLET BY MOUTH  DAILY 12/17/17   Guadalupe Maple, MD    Allergies Gabapentin, Levaquin [levofloxacin], Meloxicam, and Prednisone  Family History  Problem Relation Age of Onset  . Heart disease Mother   . Hypertension Mother   . AAA (abdominal aortic aneurysm) Mother   . Stroke Father   . Heart disease Father   . Hypertension Father   . Aneurysm Sister     Social History Social History   Tobacco Use  . Smoking status: Former Smoker    Packs/day: 1.00    Types: Cigarettes    Quit date: 07/20/2016    Years since quitting: 2.8  . Smokeless tobacco: Never Used  Substance Use Topics  . Alcohol use: No  .  Drug use: No    Review of Systems Constitutional: No fever/chills Eyes: No visual changes. ENT: No sore throat. Cardiovascular: Denies chest pain. Respiratory: Denies shortness of breath. Gastrointestinal: Positive for abdominal pain nausea and diarrhea Genitourinary: Negative for dysuria. Musculoskeletal: Negative for neck pain.  Negative for back pain. Integumentary: Negative for rash. Neurological: Negative for headaches, focal weakness or numbness.  ____________________________________________   PHYSICAL EXAM:  VITAL SIGNS: ED Triage Vitals  Enc Vitals Group     BP 05/31/19 2254 (!) 161/67     Pulse Rate 05/31/19 2254 75      Resp 05/31/19 2254 (!) 22     Temp 05/31/19 2254 98.2 F (36.8 C)     Temp src --      SpO2 05/31/19 2254 96 %     Weight 05/31/19 2252 84.8 kg (187 lb)     Height 05/31/19 2252 1.6 m (5\' 3" )     Head Circumference --      Peak Flow --      Pain Score 05/31/19 2252 2     Pain Loc --      Pain Edu? --      Excl. in GC? --     Constitutional: Alert and oriented. Well appearing and in no acute distress. Eyes: Conjunctivae are normal.  Mouth/Throat: Mucous membranes are moist.  Oropharynx non-erythematous. Neck: No stridor.   Cardiovascular: Normal rate, regular rhythm. Good peripheral circulation. Grossly normal heart sounds. Respiratory: Normal respiratory effort.  No retractions. No audible wheezing. Gastrointestinal: Left lower quadrant tenderness to palpation.  No distention.  Musculoskeletal: No lower extremity tenderness nor edema. No gross deformities of extremities. Neurologic:  Normal speech and language. No gross focal neurologic deficits are appreciated.  Skin:  Skin is warm, dry and intact. No rash noted. Psychiatric: Mood and affect are normal. Speech and behavior are normal.  ____________________________________________   LABS (all labs ordered are listed, but only abnormal results are displayed)  Labs Reviewed  CBC - Abnormal; Notable for the following components:      Result Value   RBC 3.57 (*)    Hemoglobin 10.7 (*)    HCT 33.4 (*)    All other components within normal limits  COMPREHENSIVE METABOLIC PANEL - Abnormal; Notable for the following components:   Glucose, Bld 121 (*)    BUN 25 (*)    Creatinine, Ser 1.29 (*)    GFR calc non Af Amer 42 (*)    GFR calc Af Amer 48 (*)    All other components within normal limits  SARS CORONAVIRUS 2 (HOSPITAL ORDER, PERFORMED IN [facility])  LIPASE, BLOOD  TROPONIN I (HIGH SENSITIVITY)   ____________________________________________  EKG  ED ECG REPORT I, Lula N , the attending  physician, personally viewed and interpreted this ECG.   Date: 05/31/2019  EKG Time: 9:00 PM  Rate: 71  Rhythm: Normal sinus rhythm  Axis: Normal   Intervals: Normal  ST&T Change: None  RADIOLOGY I, [place] N , personally viewed and evaluated these images (plain radiographs) as part of my medical decision making, as well as reviewing the written report by the radiologist.  ED MD interpretation: Chest x-ray revealed no acute findings.  CT abdomen pelvis revealed severe colonic diverticulosis without findings to suggest acute diverticulitis at this time CT also showed calcification of the mitral annulus  Official radiology report(s): Dg Chest 2 View  Result Date: 05/31/2019 CLINICAL DATA:  Shortness of breath. EXAM: CHEST - 2 VIEW COMPARISON:  CT dated October 10, 2018. FINDINGS: There are chronic lung markings bilaterally, grossly similar to prior CT given differences in technique.There is no pneumothorax. No large pleural effusion. There is no acute osseous abnormality. The heart size is mildly enlarged. Aortic calcifications are noted. IMPRESSION: No active cardiopulmonary disease. Electronically Signed   By: Katherine Mantlehristopher  Green M.D.   On: 05/31/2019 23:32   Ct Abdomen Pelvis W Contrast  Result Date: 06/01/2019 CLINICAL DATA:  73 year old female with history of shortness of breath. Orthopnea. Left upper quadrant abdominal pain. Nausea and diarrhea. EXAM: CT ABDOMEN AND PELVIS WITH CONTRAST TECHNIQUE: Multidetector CT imaging of the abdomen and pelvis was performed using the standard protocol following bolus administration of intravenous contrast. CONTRAST:  100mL OMNIPAQUE IOHEXOL 300 MG/ML  SOLN COMPARISON:  No priors. FINDINGS: Lower chest: Severe calcifications of the mitral annulus. Atherosclerotic calcifications in the descending thoracic aorta. Hepatobiliary: No suspicious cystic or solid hepatic lesions are confidently identified on today's noncontrast CT examination. Gallbladder  is normal in appearance. Pancreas: No pancreatic mass. No pancreatic ductal dilatation. No pancreatic or peripancreatic fluid collections or inflammatory changes are noted. Spleen: Unremarkable. Adrenals/Urinary Tract: 12 mm exophytic low-attenuation lesion in the interpolar region of the left kidney, compatible with a simple cyst. Right kidney and bilateral adrenal glands are normal in appearance. No hydroureteronephrosis. Urinary bladder is normal in appearance. Stomach/Bowel: Normal appearance of the stomach. No pathologic dilatation of small bowel or colon. Numerous colonic diverticulae are noted, without surrounding inflammatory changes to suggest an acute diverticulitis at this time. The appendix is not confidently identified and Petta be surgically absent. Regardless, there are no inflammatory changes noted adjacent to the cecum to suggest the presence of an acute appendicitis at this time. Vascular/Lymphatic: Aortic atherosclerosis, without evidence of aneurysm or dissection in the abdominal or pelvic vasculature. No lymphadenopathy noted in the abdomen or pelvis. Reproductive: Status post hysterectomy. Ovaries are not confidently identified Kunesh be surgically absent or atrophic. Other: Postoperative changes of mesh repair for ventral hernia. No significant volume of ascites. No pneumoperitoneum. Musculoskeletal: There are no aggressive appearing lytic or blastic lesions noted in the visualized portions of the skeleton. IMPRESSION: 1. No acute findings are noted in the abdomen or pelvis to account for the patient's symptoms. 2. Severe colonic diverticulosis without findings to suggest an acute diverticulitis at this time. 3. Aortic atherosclerosis. 4. There are calcifications of the mitral annulus. Echocardiographic correlation for evaluation of potential valvular dysfunction Mathieson be warranted if clinically indicated. 5. Additional incidental findings, as above. Electronically Signed   By: Trudie Reedaniel  Entrikin M.D.    On: 06/01/2019 05:34    ____________________________________________   PROCEDURES   Procedure(s) performed (including Critical Care):  Procedures   ____________________________________________   INITIAL IMPRESSION / MDM / ASSESSMENT AND PLAN / ED COURSE  As part of my medical decision making, I reviewed the following data within the electronic MEDICAL RECORD NUMBER   73 year old female presenting with above-stated history and physical exam secondary to abdominal discomfort nausea diarrhea.  Patient with no diarrhea in the emergency department and a such stool sample was not able to be obtained.  CT revealed severe diverticulosis however no evidence of diverticulitis.  Patient given IV Zofran in the emergency department will be prescribed Zofran for home.  Will also prescribe Augmentin given possibility of diverticulitis not visualized on CT.  Regarding the patient's calcifications on the mitral annulus patient had an echocardiogram performed January 2020 and is in the care of Dr. Gwen PoundsKowalski cardiology  ____________________________________________  FINAL CLINICAL IMPRESSION(S) / ED DIAGNOSES  Final diagnoses:  Diverticulosis of colon     MEDICATIONS GIVEN DURING THIS VISIT:  Medications  amoxicillin-clavulanate (AUGMENTIN) 875-125 MG per tablet 1 tablet (has no administration in time range)  ondansetron (ZOFRAN) injection 4 mg (has no administration in time range)  ondansetron (ZOFRAN) injection 4 mg (4 mg Intravenous Given 06/01/19 0439)  sodium chloride 0.9 % bolus 1,000 mL (1,000 mLs Intravenous New Bag/Given 06/01/19 0439)  iohexol (OMNIPAQUE) 300 MG/ML solution 75 mL (100 mLs Intravenous Contrast Given 06/01/19 0506)     ED Discharge Orders         Ordered    amoxicillin-clavulanate (AUGMENTIN) 875-125 MG tablet  2 times daily     06/01/19 0613    ondansetron (ZOFRAN ODT) 4 MG disintegrating tablet  Every 8 hours PRN     06/01/19 0615          *Please note:  Sherri Peters was evaluated in Emergency Department on 06/01/2019 for the symptoms described in the history of present illness. She was evaluated in the context of the global COVID-19 pandemic, which necessitated consideration that the patient might be at risk for infection with the SARS-CoV-2 virus that causes COVID-19. Institutional protocols and algorithms that pertain to the evaluation of patients at risk for COVID-19 are in a state of rapid change based on information released by regulatory bodies including the CDC and federal and state organizations. These policies and algorithms were followed during the patient's care in the ED.  Some ED evaluations and interventions Study be delayed as a result of limited staffing during the pandemic.*  Note:  This document was prepared using Dragon voice recognition software and [place] include unintentional dictation errors.   Darci CurrentBrown, [place] N, MD 06/01/19 [phone]

## 2019-06-01 NOTE — ED Notes (Signed)
Patient up to toilet.  

## 2019-06-01 NOTE — ED Notes (Signed)
ED Provider at bedside. 

## 2019-06-02 ENCOUNTER — Other Ambulatory Visit: Payer: Self-pay

## 2019-06-02 ENCOUNTER — Observation Stay
Admission: EM | Admit: 2019-06-02 | Discharge: 2019-06-04 | Disposition: A | Discharge status: Home / Self Care | Payer: Medicare Other | Attending: Internal Medicine | Admitting: Internal Medicine

## 2019-06-02 DIAGNOSIS — K529 Noninfective gastroenteritis and colitis, unspecified: Secondary | ICD-10-CM | POA: Diagnosis not present

## 2019-06-02 DIAGNOSIS — K219 Gastro-esophageal reflux disease without esophagitis: Secondary | ICD-10-CM | POA: Insufficient documentation

## 2019-06-02 DIAGNOSIS — G4733 Obstructive sleep apnea (adult) (pediatric): Secondary | ICD-10-CM | POA: Diagnosis not present

## 2019-06-02 DIAGNOSIS — Z888 Allergy status to other drugs, medicaments and biological substances status: Secondary | ICD-10-CM | POA: Diagnosis not present

## 2019-06-02 DIAGNOSIS — E785 Hyperlipidemia, unspecified: Secondary | ICD-10-CM | POA: Diagnosis not present

## 2019-06-02 DIAGNOSIS — I129 Hypertensive chronic kidney disease with stage 1 through stage 4 chronic kidney disease, or unspecified chronic kidney disease: Secondary | ICD-10-CM | POA: Diagnosis not present

## 2019-06-02 DIAGNOSIS — Z1159 Encounter for screening for other viral diseases: Secondary | ICD-10-CM | POA: Diagnosis not present

## 2019-06-02 DIAGNOSIS — Z8249 Family history of ischemic heart disease and other diseases of the circulatory system: Secondary | ICD-10-CM | POA: Insufficient documentation

## 2019-06-02 DIAGNOSIS — R112 Nausea with vomiting, unspecified: Secondary | ICD-10-CM | POA: Diagnosis present

## 2019-06-02 DIAGNOSIS — E860 Dehydration: Secondary | ICD-10-CM | POA: Diagnosis not present

## 2019-06-02 DIAGNOSIS — Z90710 Acquired absence of both cervix and uterus: Secondary | ICD-10-CM | POA: Diagnosis not present

## 2019-06-02 DIAGNOSIS — K5730 Diverticulosis of large intestine without perforation or abscess without bleeding: Secondary | ICD-10-CM | POA: Insufficient documentation

## 2019-06-02 DIAGNOSIS — Z79890 Hormone replacement therapy (postmenopausal): Secondary | ICD-10-CM | POA: Insufficient documentation

## 2019-06-02 DIAGNOSIS — R1013 Epigastric pain: Secondary | ICD-10-CM | POA: Diagnosis present

## 2019-06-02 DIAGNOSIS — Z881 Allergy status to other antibiotic agents status: Secondary | ICD-10-CM | POA: Insufficient documentation

## 2019-06-02 DIAGNOSIS — Z791 Long term (current) use of non-steroidal anti-inflammatories (NSAID): Secondary | ICD-10-CM | POA: Insufficient documentation

## 2019-06-02 DIAGNOSIS — Z79899 Other long term (current) drug therapy: Secondary | ICD-10-CM | POA: Diagnosis not present

## 2019-06-02 DIAGNOSIS — G2581 Restless legs syndrome: Secondary | ICD-10-CM

## 2019-06-02 DIAGNOSIS — Z7901 Long term (current) use of anticoagulants: Secondary | ICD-10-CM | POA: Insufficient documentation

## 2019-06-02 DIAGNOSIS — F329 Major depressive disorder, single episode, unspecified: Secondary | ICD-10-CM | POA: Diagnosis not present

## 2019-06-02 DIAGNOSIS — E039 Hypothyroidism, unspecified: Secondary | ICD-10-CM | POA: Diagnosis not present

## 2019-06-02 DIAGNOSIS — N182 Chronic kidney disease, stage 2 (mild): Secondary | ICD-10-CM | POA: Diagnosis not present

## 2019-06-02 DIAGNOSIS — Z87891 Personal history of nicotine dependence: Secondary | ICD-10-CM | POA: Diagnosis not present

## 2019-06-02 DIAGNOSIS — M1710 Unilateral primary osteoarthritis, unspecified knee: Secondary | ICD-10-CM | POA: Diagnosis not present

## 2019-06-02 DIAGNOSIS — E876 Hypokalemia: Secondary | ICD-10-CM | POA: Insufficient documentation

## 2019-06-02 LAB — COMPREHENSIVE METABOLIC PANEL
ALT: 18 U/L (ref 0–44)
AST: 17 U/L (ref 15–41)
Albumin: 3.9 g/dL (ref 3.5–5.0)
Alkaline Phosphatase: 50 U/L (ref 38–126)
Anion gap: 10 (ref 5–15)
BUN: 12 mg/dL (ref 8–23)
CO2: 22 mmol/L (ref 22–32)
Calcium: 9.5 mg/dL (ref 8.9–10.3)
Chloride: 103 mmol/L (ref 98–111)
Creatinine, Ser: 1.04 mg/dL — ABNORMAL HIGH (ref 0.44–1.00)
GFR calc Af Amer: >60 mL/min (ref >60)
GFR calc non Af Amer: 54 mL/min — ABNORMAL LOW (ref >60)
Glucose, Bld: 122 mg/dL — ABNORMAL HIGH (ref 70–99)
Potassium: 3.7 mmol/L (ref 3.5–5.1)
Sodium: 135 mmol/L (ref 135–145)
Total Bilirubin: 0.5 mg/dL (ref 0.3–1.2)
Total Protein: 6.7 g/dL (ref 6.5–8.1)

## 2019-06-02 LAB — CBC
HCT: 34.4 % — ABNORMAL LOW (ref 36.0–46.0)
Hemoglobin: 11.1 g/dL — ABNORMAL LOW (ref 12.0–15.0)
MCH: 29.5 pg (ref 26.0–34.0)
MCHC: 32.3 g/dL (ref 30.0–36.0)
MCV: 91.5 fL (ref 80.0–100.0)
Platelets: 260 10*3/uL (ref 150–400)
RBC: 3.76 MIL/uL — ABNORMAL LOW (ref 3.87–5.11)
RDW: 14.1 % (ref 11.5–15.5)
WBC: 8 10*3/uL (ref 4.0–10.5)
nRBC: 0 % (ref 0.0–0.2)

## 2019-06-02 LAB — LIPASE, BLOOD: Lipase: 21 U/L (ref 11–51)

## 2019-06-02 MED ORDER — ACETAMINOPHEN 325 MG PO TABS
650.0000 mg | ORAL_TABLET | Freq: Once | ORAL | Status: AC
  Administered 2019-06-02: 21:00:00 650 mg via ORAL
  Filled 2019-06-02: qty 2

## 2019-06-02 MED ORDER — SODIUM CHLORIDE 0.9 % IV SOLN
1000.0000 mL | Freq: Once | INTRAVENOUS | Status: AC
  Administered 2019-06-02: 15:00:00 1000 mL via INTRAVENOUS

## 2019-06-02 MED ORDER — NABUMETONE 500 MG PO TABS
500.0000 mg | ORAL_TABLET | Freq: Two times a day (BID) | ORAL | Status: DC
  Filled 2019-06-02: qty 1

## 2019-06-02 MED ORDER — MELOXICAM 7.5 MG PO TABS
15.0000 mg | ORAL_TABLET | Freq: Every day | ORAL | Status: DC
  Filled 2019-06-02: qty 2

## 2019-06-02 MED ORDER — ONDANSETRON HCL 4 MG/2ML IJ SOLN
4.0000 mg | Freq: Once | INTRAMUSCULAR | Status: AC
  Administered 2019-06-02: 15:00:00 4 mg via INTRAVENOUS
  Filled 2019-06-02: qty 2

## 2019-06-02 MED ORDER — CITALOPRAM HYDROBROMIDE 20 MG PO TABS
40.0000 mg | ORAL_TABLET | Freq: Every day | ORAL | Status: DC
  Administered 2019-06-02 – 2019-06-03 (×2): 40 mg via ORAL
  Filled 2019-06-02 (×2): qty 2

## 2019-06-02 MED ORDER — SODIUM CHLORIDE 0.9% FLUSH
3.0000 mL | Freq: Once | INTRAVENOUS | Status: AC
  Administered 2019-06-02: 15:00:00 3 mL via INTRAVENOUS

## 2019-06-02 MED ORDER — SENNOSIDES-DOCUSATE SODIUM 8.6-50 MG PO TABS: 1.0000 | ORAL_TABLET | Freq: Every evening | ORAL | Status: DC | PRN

## 2019-06-02 MED ORDER — FLUTICASONE PROPIONATE 50 MCG/ACT NA SUSP
1.0000 | Freq: Every day | NASAL | Status: DC
  Filled 2019-06-02: qty 16

## 2019-06-02 MED ORDER — LEVOTHYROXINE SODIUM 50 MCG PO TABS
75.0000 ug | ORAL_TABLET | Freq: Every day | ORAL | Status: DC
  Administered 2019-06-03 – 2019-06-04 (×2): 75 ug via ORAL
  Filled 2019-06-02 (×2): qty 2

## 2019-06-02 MED ORDER — ATORVASTATIN CALCIUM 20 MG PO TABS
40.0000 mg | ORAL_TABLET | Freq: Every day | ORAL | Status: DC
  Administered 2019-06-02 – 2019-06-03 (×2): 40 mg via ORAL
  Filled 2019-06-02 (×2): qty 2

## 2019-06-02 MED ORDER — PROMETHAZINE HCL 25 MG/ML IJ SOLN
12.5000 mg | Freq: Four times a day (QID) | INTRAMUSCULAR | Status: DC | PRN
  Administered 2019-06-02: 17:00:00 12.5 mg via INTRAVENOUS
  Filled 2019-06-02: qty 1

## 2019-06-02 MED ORDER — ATORVASTATIN CALCIUM 20 MG PO TABS: 40.0000 mg | ORAL_TABLET | Freq: Every day | ORAL | Status: DC

## 2019-06-02 MED ORDER — MORPHINE SULFATE (PF) 2 MG/ML IV SOLN
2.0000 mg | INTRAVENOUS | Status: DC | PRN
  Administered 2019-06-02: 2 mg via INTRAVENOUS
  Filled 2019-06-02: qty 1

## 2019-06-02 MED ORDER — ONDANSETRON HCL 4 MG/2ML IJ SOLN
4.0000 mg | Freq: Once | INTRAMUSCULAR | Status: AC | PRN
  Administered 2019-06-02: 14:00:00 4 mg via INTRAVENOUS
  Filled 2019-06-02: qty 2

## 2019-06-02 MED ORDER — CITALOPRAM HYDROBROMIDE 20 MG PO TABS: 40.0000 mg | ORAL_TABLET | Freq: Every day | ORAL | Status: DC

## 2019-06-02 MED ORDER — ACETAMINOPHEN 650 MG RE SUPP: 650.0000 mg | Freq: Four times a day (QID) | RECTAL | Status: DC | PRN

## 2019-06-02 MED ORDER — PANTOPRAZOLE SODIUM 40 MG IV SOLR: 40.0000 mg | INTRAVENOUS | Status: DC

## 2019-06-02 MED ORDER — PROCHLORPERAZINE EDISYLATE 10 MG/2ML IJ SOLN
10.0000 mg | INTRAMUSCULAR | Status: DC | PRN
  Administered 2019-06-02: 19:00:00 10 mg via INTRAVENOUS
  Filled 2019-06-02 (×2): qty 2

## 2019-06-02 MED ORDER — PANTOPRAZOLE SODIUM 40 MG PO TBEC: 40.0000 mg | DELAYED_RELEASE_TABLET | Freq: Every day | ORAL | Status: DC

## 2019-06-02 MED ORDER — PREGABALIN 50 MG PO CAPS
100.0000 mg | ORAL_CAPSULE | Freq: Three times a day (TID) | ORAL | Status: DC
  Administered 2019-06-02 – 2019-06-04 (×5): 100 mg via ORAL
  Filled 2019-06-02 (×5): qty 2

## 2019-06-02 MED ORDER — ACETAMINOPHEN 325 MG PO TABS
650.0000 mg | ORAL_TABLET | Freq: Four times a day (QID) | ORAL | Status: DC | PRN
  Administered 2019-06-02 – 2019-06-04 (×4): 650 mg via ORAL
  Filled 2019-06-02 (×4): qty 2

## 2019-06-02 MED ORDER — FLUTICASONE PROPIONATE 50 MCG/ACT NA SUSP
1.0000 | Freq: Every day | NASAL | Status: DC | PRN
  Administered 2019-06-02 – 2019-06-03 (×2): 1 via NASAL
  Filled 2019-06-02: qty 16

## 2019-06-02 MED ORDER — ENOXAPARIN SODIUM 40 MG/0.4ML ~~LOC~~ SOLN
40.0000 mg | SUBCUTANEOUS | Status: DC
  Administered 2019-06-02 – 2019-06-03 (×2): 40 mg via SUBCUTANEOUS
  Filled 2019-06-02 (×2): qty 0.4

## 2019-06-02 MED ORDER — SODIUM CHLORIDE 0.9 % IV SOLN
INTRAVENOUS | Status: DC
  Administered 2019-06-02 – 2019-06-04 (×4): via INTRAVENOUS

## 2019-06-02 MED ORDER — PANTOPRAZOLE SODIUM 40 MG IV SOLR
40.0000 mg | Freq: Once | INTRAVENOUS | Status: AC
  Administered 2019-06-02: 40 mg via INTRAVENOUS
  Filled 2019-06-02: qty 40

## 2019-06-02 MED ORDER — NABUMETONE 500 MG PO TABS
500.0000 mg | ORAL_TABLET | Freq: Every day | ORAL | Status: DC
  Administered 2019-06-02 – 2019-06-03 (×2): 500 mg via ORAL
  Filled 2019-06-02 (×3): qty 1

## 2019-06-02 NOTE — Progress Notes (Signed)
Advanced care plan. Purpose of the Encounter: CODE STATUS Parties in Attendance:Patient Patient's Decision Capacity:Good Subjective/Patient's story: Sherri Peters  is a 73 y.o. female with a known history of chronic kidney disease stage II, GERD, hyperlipidemia, hypertension, arthritis presented to the emergency room for abdominal discomfort nausea and vomiting for the last couple of days.  COVID-19 test is negative.  Patient unable to eat any food or drink any fluids because of the nausea and vomiting.  Patient was worked up with CT abdomen which showed no acute pathology. Objective/Medical story Patient was worked up with CT abdomen which showed no acute pathology.  Needs IV fluid hydration for dehydration and antiemetics intravenously for nausea and vomiting. Goals of care determination:  Advance care directives goals of care and treatment plan discussed Patient for now wants everything done which includes CPR, intubation ventilator if the  need arise CODE STATUS: Full code Time spent discussing advanced care planning: 16 minutes

## 2019-06-02 NOTE — ED Notes (Addendum)
First nurse note: Pt here via EMS with c/o abd pain, treated for diverticulosis 2 days ago and is not feeling any better. NAD. Vomiting per EMS. BP has been elevated 154/98. Covid negative.

## 2019-06-02 NOTE — Care Management Obs Status (Signed)
[facility] NOTIFICATION   Patient Details  Name: Sherri Peters MRN: [id number] Date of Birth: September 03, 1946   Medicare Observation Status Notification Given:  Yes  Patient request [name] to sign for her as she is not feeling well.   [name], RN 06/02/2019, 2:54 PM

## 2019-06-02 NOTE — ED Triage Notes (Signed)
Pt comes into the ED via EMS from home with c/o abd pain with N/V/D since Saturday and was seen here Saturday night, states she was diagnosed with diverticulitis and Rx meds. States it is getting worse and she is not able to keep anything down. States she took zofran this morning.

## 2019-06-02 NOTE — ED Notes (Signed)
ED TO INPATIENT HANDOFF REPORT  ED Nurse Name and Phone #: Tobi Bastosnna [phone]  S Name/Age/Gender Sherri Peters 73 y.o. female Room/Bed: ED34A/ED34A  Code Status   Code Status: Not on file  Home/SNF/Other Home Patient oriented to: self, place, time and situation Is this baseline? Yes   Triage Complete: Triage complete  Chief Complaint nausea;vomiting ems  Triage Note Pt comes into the ED via EMS from home with c/o abd pain with N/V/D since Saturday and was seen here Saturday night, states she was diagnosed with diverticulitis and Rx meds. States it is getting worse and she is not able to keep anything down. States she took zofran this morning.    Allergies Allergies  Allergen Reactions  . Gabapentin Swelling  . Levaquin [Levofloxacin] Other (See Comments)    hallucinations  . Meloxicam Swelling    Level of Care/Admitting Diagnosis ED Disposition    ED Disposition Condition Comment   Admit  [facility] [[id number]]  Level of Care: Med-Surg [16]  Covid Evaluation: Confirmed COVID Negative  Diagnosis: Nausea and vomiting in adult [454098][[id number]]  Admitting Physician: Ihor AustinPYREDDY, PAVAN [[id number]]  Attending Physician: Ihor AustinPYREDDY, PAVAN [[id number]]  PT Class (Do Not Modify): Observation [104]  PT Acc Code (Do Not Modify): Observation [10022]       B Medical/Surgery History Past Medical History:  Diagnosis Date  . Arthritis   . Chronic kidney disease   . Depression   . GERD (gastroesophageal reflux disease)   . Hyperlipidemia   . Hypertension    Past Surgical History:  Procedure Laterality Date  . ABDOMINAL HYSTERECTOMY       A IV Location/Drains/Wounds Patient Lines/Drains/Airways Status   Active Line/Drains/Airways    Name:   Placement date:   Placement time:   Site:   Days:   Peripheral IV 06/02/19 Right Forearm   06/02/19    1335    Forearm   less than 1          Intake/Output Last 24 hours No intake or output data in the 24 hours  ending 06/02/19 1500  Labs/Imaging Results for orders placed or performed during the hospital encounter of 06/02/19 (from the past 48 hour(s))  Lipase, blood     Status: None   Collection Time: 06/02/19  1:33 PM  Result Value Ref Range   Lipase 21 11 - 51 U/L    Comment: Performed at [facility], [address] [place], [place] [address]  Comprehensive metabolic panel     Status: Abnormal   Collection Time: 06/02/19  1:33 PM  Result Value Ref Range   Sodium 135 135 - 145 mmol/L   Potassium 3.7 3.5 - 5.1 mmol/L   Chloride 103 98 - 111 mmol/L   CO2 22 22 - 32 mmol/L   Glucose, Bld 122 (H) 70 - 99 mg/dL   BUN 12 8 - 23 mg/dL   Creatinine, Ser 5.781.04 (H) 0.44 - 1.00 mg/dL   Calcium 9.5 8.9 - 46.910.3 mg/dL   Total Protein 6.7 6.5 - 8.1 g/dL   Albumin 3.9 3.5 - 5.0 g/dL   AST 17 15 - 41 U/L   ALT 18 0 - 44 U/L   Alkaline Phosphatase 50 38 - 126 U/L   Total Bilirubin 0.5 0.3 - 1.2 mg/dL   GFR calc non Af Amer 54 (L) >60 mL/min   GFR calc Af Amer >60 >60 mL/min   Anion gap 10 5 - 15    Comment: Performed at [facility], [address] [place], [place] [address]  CBC     Status: Abnormal   Collection Time: 06/02/19  1:33 PM  Result Value Ref Range   WBC 8.0 4.0 - 10.5 K/uL   RBC 3.76 (L) 3.87 - 5.11 MIL/uL   Hemoglobin 11.1 (L) 12.0 - 15.0 g/dL   HCT 34.4 (L) 36.0 - 46.0 %   MCV 91.5 80.0 - 100.0 fL   MCH 29.5 26.0 - 34.0 pg   MCHC 32.3 30.0 - 36.0 g/dL   RDW 14.1 11.5 - 15.5 %   Platelets 260 150 - 400 K/uL   nRBC 0.0 0.0 - 0.2 %    Comment: Performed at [facility], [address], [place], [place] [address]   Dg Chest 2 View  Result Date: 05/31/2019 CLINICAL DATA:  Shortness of breath. EXAM: CHEST - 2 VIEW COMPARISON:  CT dated October 10, 2018. FINDINGS: There are chronic lung markings bilaterally, grossly similar to prior CT given differences in technique.There is no pneumothorax. No large pleural effusion. There is no acute osseous  abnormality. The heart size is mildly enlarged. Aortic calcifications are noted. IMPRESSION: No active cardiopulmonary disease. Electronically Signed   By: Constance Holster M.D.   On: 05/31/2019 23:32   Ct Abdomen Pelvis W Contrast  Result Date: 06/01/2019 CLINICAL DATA:  73 year old female with history of shortness of breath. Orthopnea. Left upper quadrant abdominal pain. Nausea and diarrhea. EXAM: CT ABDOMEN AND PELVIS WITH CONTRAST TECHNIQUE: Multidetector CT imaging of the abdomen and pelvis was performed using the standard protocol following bolus administration of intravenous contrast. CONTRAST:  117mL OMNIPAQUE IOHEXOL 300 MG/ML  SOLN COMPARISON:  No priors. FINDINGS: Lower chest: Severe calcifications of the mitral annulus. Atherosclerotic calcifications in the descending thoracic aorta. Hepatobiliary: No suspicious cystic or solid hepatic lesions are confidently identified on today's noncontrast CT examination. Gallbladder is normal in appearance. Pancreas: No pancreatic mass. No pancreatic ductal dilatation. No pancreatic or peripancreatic fluid collections or inflammatory changes are noted. Spleen: Unremarkable. Adrenals/Urinary Tract: 12 mm exophytic low-attenuation lesion in the interpolar region of the left kidney, compatible with a simple cyst. Right kidney and bilateral adrenal glands are normal in appearance. No hydroureteronephrosis. Urinary bladder is normal in appearance. Stomach/Bowel: Normal appearance of the stomach. No pathologic dilatation of small bowel or colon. Numerous colonic diverticulae are noted, without surrounding inflammatory changes to suggest an acute diverticulitis at this time. The appendix is not confidently identified and Moro be surgically absent. Regardless, there are no inflammatory changes noted adjacent to the cecum to suggest the presence of an acute appendicitis at this time. Vascular/Lymphatic: Aortic atherosclerosis, without evidence of aneurysm or dissection  in the abdominal or pelvic vasculature. No lymphadenopathy noted in the abdomen or pelvis. Reproductive: Status post hysterectomy. Ovaries are not confidently identified Wiler be surgically absent or atrophic. Other: Postoperative changes of mesh repair for ventral hernia. No significant volume of ascites. No pneumoperitoneum. Musculoskeletal: There are no aggressive appearing lytic or blastic lesions noted in the visualized portions of the skeleton. IMPRESSION: 1. No acute findings are noted in the abdomen or pelvis to account for the patient's symptoms. 2. Severe colonic diverticulosis without findings to suggest an acute diverticulitis at this time. 3. Aortic atherosclerosis. 4. There are calcifications of the mitral annulus. Echocardiographic correlation for evaluation of potential valvular dysfunction Tiggs be warranted if clinically indicated. 5. Additional incidental findings, as above. Electronically Signed   By: Vinnie Langton M.D.   On: 06/01/2019 05:34  Pending Labs Unresulted Labs (From admission, onward)    Start     Ordered   06/02/19 1327  Urinalysis, Complete w Microscopic  ONCE - STAT,   STAT     06/02/19 1327   Signed and Held  CBC  (enoxaparin (LOVENOX)    CrCl >/= 30 ml/min)  Once,   R    Comments: Baseline for enoxaparin therapy IF NOT ALREADY DRAWN.  Notify MD if PLT < 100 K.    Signed and Held   Signed and Held  Creatinine, serum  (enoxaparin (LOVENOX)    CrCl >/= 30 ml/min)  Once,   R    Comments: Baseline for enoxaparin therapy IF NOT ALREADY DRAWN.    Signed and Held   Signed and Held  Creatinine, serum  (enoxaparin (LOVENOX)    CrCl >/= 30 ml/min)  Weekly,   R    Comments: while on enoxaparin therapy    Signed and Held   Signed and Held  Basic metabolic panel  Tomorrow morning,   R     Signed and Held   Signed and Held  CBC  Tomorrow morning,   R     Signed and Held          Vitals/Pain Today's Vitals   06/02/19 1323 06/02/19 1324 06/02/19 1325  BP:   (!)  156/73  Pulse:   70  Resp: 20    Temp:   97.7 F (36.5 C)  TempSrc: Oral    SpO2:   97%  Weight:  84.8 kg   Height:  5\' 3"  (1.6 m)   PainSc:  0-No pain     Isolation Precautions No active isolations  Medications Medications  sodium chloride flush (NS) 0.9 % injection 3 mL (has no administration in time range)  pantoprazole (PROTONIX) injection 40 mg (has no administration in time range)  ondansetron (ZOFRAN) injection 4 mg (4 mg Intravenous Given 06/02/19 1333)  ondansetron (ZOFRAN) injection 4 mg (4 mg Intravenous Given 06/02/19 1435)  0.9 %  sodium chloride infusion (1,000 mLs Intravenous New Bag/Given 06/02/19 1434)    Mobility walks Low fall risk   Focused Assessments GI   R Recommendations: See Admitting Provider Note  Report given to:   Additional Notes:

## 2019-06-02 NOTE — H&P (Signed)
[facility] NAME: Sherri Peters    MR#:  [id number]  DATE OF BIRTH:  01-Aug-1946  DATE OF ADMISSION:  06/02/2019  PRIMARY CARE PHYSICIAN: Barbaraann Boys, MD   REQUESTING/REFERRING PHYSICIAN:   CHIEF COMPLAINT:   Chief Complaint  Patient presents with  . Abdominal Pain    HISTORY OF PRESENT ILLNESS: Sherri Peters  is a 73 y.o. female with a known history of chronic kidney disease stage II, GERD, hyperlipidemia, hypertension, arthritis presented to the emergency room for abdominal discomfort nausea and vomiting for the last couple of days.  COVID-19 test is negative.  Patient unable to eat any food or drink any fluids because of the nausea and vomiting.  Patient was worked up with CT abdomen which showed no acute pathology.  Hospitalist service was consulted for further care.  No history of any recent travel.  No sick contacts at home.  PAST MEDICAL HISTORY:   Past Medical History:  Diagnosis Date  . Arthritis   . Chronic kidney disease   . Depression   . GERD (gastroesophageal reflux disease)   . Hyperlipidemia   . Hypertension     PAST SURGICAL HISTORY:  Past Surgical History:  Procedure Laterality Date  . ABDOMINAL HYSTERECTOMY      SOCIAL HISTORY:  Social History   Tobacco Use  . Smoking status: Former Smoker    Packs/day: 1.00    Types: Cigarettes    Quit date: 07/20/2016    Years since quitting: 2.8  . Smokeless tobacco: Never Used  Substance Use Topics  . Alcohol use: No    FAMILY HISTORY:  Family History  Problem Relation Age of Onset  . Heart disease Mother   . Hypertension Mother   . AAA (abdominal aortic aneurysm) Mother   . Stroke Father   . Heart disease Father   . Hypertension Father   . Aneurysm Sister     DRUG ALLERGIES:  Allergies  Allergen Reactions  . Gabapentin Swelling  . Levaquin [Levofloxacin] Other (See Comments)    hallucinations  . Meloxicam Swelling    REVIEW OF SYSTEMS:    CONSTITUTIONAL: No fever,has fatigue and weakness.  EYES: No blurred or double vision.  EARS, NOSE, AND THROAT: No tinnitus or ear pain.  RESPIRATORY: No cough, shortness of breath, wheezing or hemoptysis.  CARDIOVASCULAR: No chest pain, orthopnea, edema.  GASTROINTESTINAL: Has nausea, vomiting, mild abdominal pain.  No diarrhea. GENITOURINARY: No dysuria, hematuria.  ENDOCRINE: No polyuria, nocturia,  HEMATOLOGY: No anemia, easy bruising or bleeding SKIN: No rash or lesion. MUSCULOSKELETAL: No joint pain or arthritis.   NEUROLOGIC: No tingling, numbness, weakness.  PSYCHIATRY: No anxiety or depression.   MEDICATIONS AT HOME:  Prior to Admission medications   Medication Sig Start Date End Date Taking? Authorizing Provider  amoxicillin-clavulanate (AUGMENTIN) 875-125 MG tablet Take 1 tablet by mouth 2 (two) times daily for 10 days. 06/01/19 06/11/19  Gregor Hams, MD  atorvastatin (LIPITOR) 40 MG tablet TAKE 1 TABLET BY MOUTH  DAILY 12/17/17   Guadalupe Maple, MD  citalopram (CELEXA) 40 MG tablet TAKE 1 TABLET BY MOUTH  DAILY 12/17/17   Guadalupe Maple, MD  fluticasone Asencion Islam) 50 MCG/ACT nasal spray  04/12/15   [provider]  levothyroxine (SYNTHROID, LEVOTHROID) 75 MCG tablet TAKE 1 TABLET BY MOUTH  DAILY 12/17/17   Guadalupe Maple, MD  meloxicam (MOBIC) 15 MG tablet TAKE 1 TABLET BY MOUTH  DAILY 10/18/16  Steele Sizerrissman, Mark A, MD  nabumetone (RELAFEN) 500 MG tablet Take 1 tablet (500 mg total) by mouth 2 (two) times daily. 03/07/17   Steele Sizerrissman, Mark A, MD  omeprazole (PRILOSEC) 20 MG capsule TAKE 1 CAPSULE BY MOUTH  DAILY 12/17/17   Steele Sizerrissman, Mark A, MD  ondansetron (ZOFRAN ODT) 4 MG disintegrating tablet Take 1 tablet (4 mg total) by mouth every 8 (eight) hours as needed. 06/01/19   Darci CurrentBrown, [place] N, MD  pregabalin (LYRICA) 100 MG capsule Take 1 capsule (100 mg total) by mouth 3 (three) times daily. 07/03/17   Gabriel CirriWicker, Cheryl, NP  telmisartan-hydrochlorothiazide (MICARDIS HCT)  80-12.5 MG tablet TAKE 1 TABLET BY MOUTH  DAILY 12/17/17   Steele Sizerrissman, Mark A, MD      PHYSICAL EXAMINATION:   VITAL SIGNS: Blood pressure (!) 156/73, pulse 70, temperature 97.7 F (36.5 C), resp. rate 20, height 5\' 3"  (1.6 m), weight 84.8 kg, SpO2 97 %.  GENERAL:  73 y.o.-year-old patient lying in the bed with no acute distress.  EYES: Pupils equal, round, reactive to light and accommodation. No scleral icterus. Extraocular muscles intact.  HEENT: Head atraumatic, normocephalic. Oropharynx dry and nasopharynx clear.  NECK:  Supple, no jugular venous distention. No thyroid enlargement, no tenderness.  LUNGS: Normal breath sounds bilaterally, no wheezing, rales,rhonchi or crepitation. No use of accessory muscles of respiration.  CARDIOVASCULAR: S1, S2 normal. No murmurs, rubs, or gallops.  ABDOMEN: Soft, mild tenderness around the umbilicus, nondistended. Bowel sounds present. No organomegaly or mass.  EXTREMITIES: No pedal edema, cyanosis, or clubbing.  NEUROLOGIC: Cranial nerves II through XII are intact. Muscle strength 5/5 in all extremities. Sensation intact. Gait not checked.  PSYCHIATRIC: The patient is alert and oriented x 3.  SKIN: No obvious rash, lesion, or ulcer.   LABORATORY PANEL:   CBC Recent Labs  Lab 05/31/19 2257 06/02/19 1333  WBC 8.8 8.0  HGB 10.7* 11.1*  HCT 33.4* 34.4*  PLT 235 260  MCV 93.6 91.5  MCH 30.0 29.5  MCHC 32.0 32.3  RDW 14.2 14.1   ------------------------------------------------------------------------------------------------------------------  Chemistries  Recent Labs  Lab 05/31/19 2257 06/02/19 1333  NA 140 135  K 4.4 3.7  CL 109 103  CO2 22 22  GLUCOSE 121* 122*  BUN 25* 12  CREATININE 1.29* 1.04*  CALCIUM 9.3 9.5  AST 17 17  ALT 20 18  ALKPHOS 59 50  BILITOT 0.6 0.5   ------------------------------------------------------------------------------------------------------------------ estimated creatinine clearance is 51.2  mL/min (A) (by C-G formula based on SCr of 1.04 mg/dL (H)). ------------------------------------------------------------------------------------------------------------------ No results for input(s): TSH, T4TOTAL, T3FREE, THYROIDAB in the last 72 hours.  Invalid input(s): FREET3   Coagulation profile No results for input(s): INR, PROTIME in the last 168 hours. ------------------------------------------------------------------------------------------------------------------- No results for input(s): DDIMER in the last 72 hours. -------------------------------------------------------------------------------------------------------------------  Cardiac Enzymes No results for input(s): CKMB, TROPONINI, MYOGLOBIN in the last 168 hours.  Invalid input(s): CK ------------------------------------------------------------------------------------------------------------------ Invalid input(s): POCBNP  ---------------------------------------------------------------------------------------------------------------  Urinalysis    Component Value Date/Time   COLORURINE STRAW (A) 06/01/2019 0704   APPEARANCEUR HAZY (A) 06/01/2019 0704   APPEARANCEUR Clear 08/24/2016 1409   LABSPEC 1.024 06/01/2019 0704   PHURINE 5.0 06/01/2019 0704   GLUCOSEU NEGATIVE 06/01/2019 0704   HGBUR NEGATIVE 06/01/2019 0704   BILIRUBINUR NEGATIVE 06/01/2019 0704   BILIRUBINUR Negative 08/24/2016 1409   KETONESUR NEGATIVE 06/01/2019 0704   PROTEINUR NEGATIVE 06/01/2019 0704   NITRITE NEGATIVE 06/01/2019 0704   LEUKOCYTESUR NEGATIVE 06/01/2019 0704     RADIOLOGY: Dg Chest 2 View  Result Date: 05/31/2019 CLINICAL DATA:  Shortness of breath. EXAM: CHEST - 2 VIEW COMPARISON:  CT dated October 10, 2018. FINDINGS: There are chronic lung markings bilaterally, grossly similar to prior CT given differences in technique.There is no pneumothorax. No large pleural effusion. There is no acute osseous abnormality. The heart size  is mildly enlarged. Aortic calcifications are noted. IMPRESSION: No active cardiopulmonary disease. Electronically Signed   By: Katherine Mantlehristopher  Green M.D.   On: 05/31/2019 23:32   Ct Abdomen Pelvis W Contrast  Result Date: 06/01/2019 CLINICAL DATA:  73 year old female with history of shortness of breath. Orthopnea. Left upper quadrant abdominal pain. Nausea and diarrhea. EXAM: CT ABDOMEN AND PELVIS WITH CONTRAST TECHNIQUE: Multidetector CT imaging of the abdomen and pelvis was performed using the standard protocol following bolus administration of intravenous contrast. CONTRAST:  100mL OMNIPAQUE IOHEXOL 300 MG/ML  SOLN COMPARISON:  No priors. FINDINGS: Lower chest: Severe calcifications of the mitral annulus. Atherosclerotic calcifications in the descending thoracic aorta. Hepatobiliary: No suspicious cystic or solid hepatic lesions are confidently identified on today's noncontrast CT examination. Gallbladder is normal in appearance. Pancreas: No pancreatic mass. No pancreatic ductal dilatation. No pancreatic or peripancreatic fluid collections or inflammatory changes are noted. Spleen: Unremarkable. Adrenals/Urinary Tract: 12 mm exophytic low-attenuation lesion in the interpolar region of the left kidney, compatible with a simple cyst. Right kidney and bilateral adrenal glands are normal in appearance. No hydroureteronephrosis. Urinary bladder is normal in appearance. Stomach/Bowel: Normal appearance of the stomach. No pathologic dilatation of small bowel or colon. Numerous colonic diverticulae are noted, without surrounding inflammatory changes to suggest an acute diverticulitis at this time. The appendix is not confidently identified and Fullen be surgically absent. Regardless, there are no inflammatory changes noted adjacent to the cecum to suggest the presence of an acute appendicitis at this time. Vascular/Lymphatic: Aortic atherosclerosis, without evidence of aneurysm or dissection in the abdominal or pelvic  vasculature. No lymphadenopathy noted in the abdomen or pelvis. Reproductive: Status post hysterectomy. Ovaries are not confidently identified Edelen be surgically absent or atrophic. Other: Postoperative changes of mesh repair for ventral hernia. No significant volume of ascites. No pneumoperitoneum. Musculoskeletal: There are no aggressive appearing lytic or blastic lesions noted in the visualized portions of the skeleton. IMPRESSION: 1. No acute findings are noted in the abdomen or pelvis to account for the patient's symptoms. 2. Severe colonic diverticulosis without findings to suggest an acute diverticulitis at this time. 3. Aortic atherosclerosis. 4. There are calcifications of the mitral annulus. Echocardiographic correlation for evaluation of potential valvular dysfunction Menzel be warranted if clinically indicated. 5. Additional incidental findings, as above. Electronically Signed   By: Trudie Reedaniel  Entrikin M.D.   On: 06/01/2019 05:34    EKG: Orders placed or performed during the hospital encounter of 06/01/19  . ED EKG  . ED EKG  . EKG    IMPRESSION AND PLAN: 73 yr old patient with a known history of chronic kidney disease stage II, GERD, hyperlipidemia, hypertension, arthritis presented to the emergency room for abdominal discomfort nausea and vomiting for the last couple of days.  COVID-19 test is negative.  -Intractable nausea vomiting Admit patient to medical floor observation bed IV fluid hydration Antiemetics Monitor electrolytes  -Dehydration IV fluids  -Colonic diverticulosis High-fiber diet once tolerating  - DVT prophylaxis with subcu Lovenox daily  -GERD Proton pump inhibitor  All the records are reviewed and case discussed with ED provider. Management plans discussed with the patient, family and they are in agreement.  CODE  STATUS : Full code   TOTAL TIME TAKING CARE OF THIS PATIENT: 52 minutes.    Ihor AustinPavan Evette Diclemente M.D on 06/02/2019 at 2:48 PM  Between 7am to 6pm -  Pager - [phone]  After 6pm go to [email or web address] - password [email or web address] [email or web address]  [facility]  Office  [phone]  CC: Primary care physician; Orene DesanctisBehling, Karen, MD

## 2019-06-02 NOTE — ED Provider Notes (Signed)
[facility] Emergency Department Provider Note   ____________________________________________    I have reviewed the triage vital signs and the nursing notes.   HISTORY  Chief Complaint Abdominal Pain     HPI Sherri Peters is a 73 y.o. female who presents with complaints of abdominal pain, nausea and vomiting.  Patient was seen in the emergency department proximately 24 hours ago, had CT scan done at that point for nausea vomiting upper abdominal pain.  She reports he has been feeling somewhat better but now her symptoms have returned.  Complains of epigastric discomfort nausea vomiting.  No fevers or chills.  Had negative COVID swab yesterday.  Has been taking medications as prescribed without relief  Past Medical History:  Diagnosis Date  . Arthritis   . Chronic kidney disease   . Depression   . GERD (gastroesophageal reflux disease)   . Hyperlipidemia   . Hypertension     Patient Active Problem List   Diagnosis Date Noted  . Nausea and vomiting in adult 06/02/2019  . Carpal tunnel syndrome of left wrist 06/07/2016  . Restless leg syndrome 12/14/2015  . Arthritis of knee 12/14/2015  . Shingles 08/23/2015  . OSA on CPAP 05/18/2015  . Hyperlipidemia   . Hypothyroid   . Hypertension   . Depression     Past Surgical History:  Procedure Laterality Date  . ABDOMINAL HYSTERECTOMY      Prior to Admission medications   Medication Sig Start Date End Date Taking? Authorizing Provider  amoxicillin-clavulanate (AUGMENTIN) 875-125 MG tablet Take 1 tablet by mouth 2 (two) times daily for 10 days. 06/01/19 06/11/19  Darci CurrentBrown, [place] N, MD  atorvastatin (LIPITOR) 40 MG tablet TAKE 1 TABLET BY MOUTH  DAILY 12/17/17   Steele Sizerrissman, Mark A, MD  citalopram (CELEXA) 40 MG tablet TAKE 1 TABLET BY MOUTH  DAILY 12/17/17   Steele Sizerrissman, Mark A, MD  fluticasone Aleda Grana(FLONASE) 50 MCG/ACT nasal spray  04/12/15   [provider]  levothyroxine (SYNTHROID, LEVOTHROID) 75 MCG  tablet TAKE 1 TABLET BY MOUTH  DAILY 12/17/17   Steele Sizerrissman, Mark A, MD  meloxicam (MOBIC) 15 MG tablet TAKE 1 TABLET BY MOUTH  DAILY 10/18/16   Steele Sizerrissman, Mark A, MD  nabumetone (RELAFEN) 500 MG tablet Take 1 tablet (500 mg total) by mouth 2 (two) times daily. 03/07/17   Steele Sizerrissman, Mark A, MD  omeprazole (PRILOSEC) 20 MG capsule TAKE 1 CAPSULE BY MOUTH  DAILY 12/17/17   Steele Sizerrissman, Mark A, MD  ondansetron (ZOFRAN ODT) 4 MG disintegrating tablet Take 1 tablet (4 mg total) by mouth every 8 (eight) hours as needed. 06/01/19   Darci CurrentBrown, [place] N, MD  pregabalin (LYRICA) 100 MG capsule Take 1 capsule (100 mg total) by mouth 3 (three) times daily. 07/03/17   Gabriel CirriWicker, Cheryl, NP  telmisartan-hydrochlorothiazide (MICARDIS HCT) 80-12.5 MG tablet TAKE 1 TABLET BY MOUTH  DAILY 12/17/17   Steele Sizerrissman, Mark A, MD     Allergies Gabapentin, Levaquin [levofloxacin], and Meloxicam  Family History  Problem Relation Age of Onset  . Heart disease Mother   . Hypertension Mother   . AAA (abdominal aortic aneurysm) Mother   . Stroke Father   . Heart disease Father   . Hypertension Father   . Aneurysm Sister     Social History Social History   Tobacco Use  . Smoking status: Former Smoker    Packs/day: 1.00    Types: Cigarettes    Quit date: 07/20/2016    Years since quitting: 2.8  .  Smokeless tobacco: Never Used  Substance Use Topics  . Alcohol use: No  . Drug use: No    Review of Systems  Constitutional: No fever/chills Eyes: No visual changes.  ENT: No sore throat. Cardiovascular: Denies chest pain. Respiratory: Denies shortness of breath. Gastrointestinal: As above Genitourinary: Negative for dysuria. Musculoskeletal: Negative for back pain. Skin: Negative for rash. Neurological: Negative for headaches or weakness   ____________________________________________   PHYSICAL EXAM:  VITAL SIGNS: ED Triage Vitals  Enc Vitals Group     BP 06/02/19 1325 (!) 156/73     Pulse Rate 06/02/19 1325 70      Resp 06/02/19 1323 20     Temp 06/02/19 1325 97.7 F (36.5 C)     Temp Source 06/02/19 1323 Oral     SpO2 06/02/19 1325 97 %     Weight 06/02/19 1324 84.8 kg (187 lb)     Height 06/02/19 1324 1.6 m (5\' 3" )     Head Circumference --      Peak Flow --      Pain Score 06/02/19 1324 0     Pain Loc --      Pain Edu? --      Excl. in GC? --     Constitutional: Alert and oriented  Nose: No congestion/rhinnorhea. Mouth/Throat: Mucous membranes are dry Neck:  Painless ROM Cardiovascular: Normal rate, regular rhythm. Grossly normal heart sounds.  Good peripheral circulation. Respiratory: Normal respiratory effort.  No retractions.  Gastrointestinal: Soft and nontender. No distention.  No CVA tenderness.  Reassuring exam  Musculoskeletal:  Warm and well perfused Neurologic:  Normal speech and language. No gross focal neurologic deficits are appreciated.  Skin:  Skin is warm, dry and intact. No rash noted. Psychiatric: Mood and affect are normal. Speech and behavior are normal.  ____________________________________________   LABS (all labs ordered are listed, but only abnormal results are displayed)  Labs Reviewed  COMPREHENSIVE METABOLIC PANEL - Abnormal; Notable for the following components:      Result Value   Glucose, Bld 122 (*)    Creatinine, Ser 1.04 (*)    GFR calc non Af Amer 54 (*)    All other components within normal limits  CBC - Abnormal; Notable for the following components:   RBC 3.76 (*)    Hemoglobin 11.1 (*)    HCT 34.4 (*)    All other components within normal limits  LIPASE, BLOOD  URINALYSIS, COMPLETE (UACMP) WITH MICROSCOPIC   ____________________________________________  EKG  None ____________________________________________  RADIOLOGY  None ____________________________________________   PROCEDURES  Procedure(s) performed: No  Procedures   Critical Care performed: No ____________________________________________   INITIAL IMPRESSION  / ASSESSMENT AND PLAN / ED COURSE  Pertinent labs & imaging results that were available during my care of the patient were reviewed by me and considered in my medical decision making (see chart for details).  Patient presents with continued nausea vomiting and abdominal discomfort.  Reviewed CT scan from yesterday which was unremarkable.  Lab work today is not significantly changed.  COVID swab was negative yesterday.  We will treat with IV fluids and IV Zofran and reevaluate.  Patient with continued vomiting, additional dose of IV Zofran given.  She remains nauseated, will discuss with hospitalist for admission    ____________________________________________   FINAL CLINICAL IMPRESSION(S) / ED DIAGNOSES  Final diagnoses:  Intractable vomiting with nausea, unspecified vomiting type  Epigastric pain        Note:  This document was prepared using  Dragon Armed forces training and education officer and Guynes include unintentional dictation errors.   Lavonia Drafts, MD 06/02/19 1455

## 2019-06-03 LAB — BASIC METABOLIC PANEL
Anion gap: 5 (ref 5–15)
BUN: 11 mg/dL (ref 8–23)
CO2: 27 mmol/L (ref 22–32)
Calcium: 8.7 mg/dL — ABNORMAL LOW (ref 8.9–10.3)
Chloride: 107 mmol/L (ref 98–111)
Creatinine, Ser: 1.16 mg/dL — ABNORMAL HIGH (ref 0.44–1.00)
GFR calc Af Amer: 55 mL/min — ABNORMAL LOW (ref >60)
GFR calc non Af Amer: 47 mL/min — ABNORMAL LOW (ref >60)
Glucose, Bld: 94 mg/dL (ref 70–99)
Potassium: 3.3 mmol/L — ABNORMAL LOW (ref 3.5–5.1)
Sodium: 139 mmol/L (ref 135–145)

## 2019-06-03 LAB — CBC
HCT: 30.5 % — ABNORMAL LOW (ref 36.0–46.0)
Hemoglobin: 9.5 g/dL — ABNORMAL LOW (ref 12.0–15.0)
MCH: 29.5 pg (ref 26.0–34.0)
MCHC: 31.1 g/dL (ref 30.0–36.0)
MCV: 94.7 fL (ref 80.0–100.0)
Platelets: 198 10*3/uL (ref 150–400)
RBC: 3.22 MIL/uL — ABNORMAL LOW (ref 3.87–5.11)
RDW: 14.2 % (ref 11.5–15.5)
WBC: 6.4 10*3/uL (ref 4.0–10.5)
nRBC: 0 % (ref 0.0–0.2)

## 2019-06-03 MED ORDER — PANTOPRAZOLE SODIUM 40 MG PO TBEC
40.0000 mg | DELAYED_RELEASE_TABLET | Freq: Every day | ORAL | Status: DC
  Administered 2019-06-03 – 2019-06-04 (×2): 40 mg via ORAL
  Filled 2019-06-03 (×2): qty 1

## 2019-06-03 MED ORDER — POTASSIUM CHLORIDE 10 MEQ/100ML IV SOLN
10.0000 meq | INTRAVENOUS | Status: AC
  Administered 2019-06-03 (×4): 10 meq via INTRAVENOUS
  Filled 2019-06-03 (×2): qty 100

## 2019-06-03 MED ORDER — ONDANSETRON HCL 4 MG/2ML IJ SOLN
4.0000 mg | Freq: Four times a day (QID) | INTRAMUSCULAR | Status: DC
  Administered 2019-06-03: 15:00:00 4 mg via INTRAVENOUS
  Filled 2019-06-03: qty 2

## 2019-06-03 MED ORDER — BUPROPION HCL ER (XL) 150 MG PO TB24
150.0000 mg | ORAL_TABLET | Freq: Every day | ORAL | Status: DC
  Administered 2019-06-03 – 2019-06-04 (×2): 150 mg via ORAL
  Filled 2019-06-03 (×2): qty 1

## 2019-06-03 MED ORDER — ONDANSETRON HCL 4 MG/2ML IJ SOLN
4.0000 mg | Freq: Four times a day (QID) | INTRAMUSCULAR | Status: DC
  Administered 2019-06-03 – 2019-06-04 (×3): 4 mg via INTRAVENOUS
  Filled 2019-06-03 (×3): qty 2

## 2019-06-03 MED ORDER — SODIUM CHLORIDE 0.9 % IV SOLN
INTRAVENOUS | Status: DC | PRN
  Administered 2019-06-03: 09:00:00 1 mL via INTRAVENOUS

## 2019-06-03 NOTE — Progress Notes (Addendum)
[facility] - [place] [facility]   PATIENT NAME: Sherri HymenBonnie Peters    MR#:  [id number]  DATE OF BIRTH:  1945-12-05  SUBJECTIVE:  CHIEF COMPLAINT:   Chief Complaint  Patient presents with  . Abdominal Pain   -Nausea is some better.  No further vomiting or diarrhea. -[place] quit smoking about 2 weeks ago.  REVIEW OF SYSTEMS:  Review of Systems  Constitutional: Positive for malaise/fatigue. Negative for chills and fever.  HENT: Negative for congestion, ear discharge, hearing loss and nosebleeds.   Eyes: Negative for blurred vision and double vision.  Respiratory: Negative for cough, shortness of breath and wheezing.   Cardiovascular: Negative for chest pain and palpitations.  Gastrointestinal: Positive for abdominal pain and nausea. Negative for constipation, diarrhea and vomiting.  Genitourinary: Negative for dysuria.  Musculoskeletal: Negative for myalgias.  Neurological: Negative for dizziness, focal weakness, seizures, weakness and headaches.  Psychiatric/Behavioral: Negative for depression.    DRUG ALLERGIES:   Allergies  Allergen Reactions  . Levaquin [Levofloxacin] Other (See Comments)    hallucinations  . Gabapentin Swelling  . Meloxicam Swelling    VITALS:  Blood pressure (!) 144/75, pulse 61, temperature 98.5 F (36.9 C), temperature source Oral, resp. rate 20, height 5\' 3"  (1.6 m), weight 84.8 kg, SpO2 94 %.  PHYSICAL EXAMINATION:  Physical Exam   GENERAL:  73 y.o.-year-old patient lying in the bed with no acute distress.  EYES: Pupils equal, round, reactive to light and accommodation. No scleral icterus. Extraocular muscles intact.  HEENT: Head atraumatic, normocephalic. Oropharynx and nasopharynx clear.  NECK:  Supple, no jugular venous distention. No thyroid enlargement, no tenderness.  LUNGS: Normal breath sounds bilaterally, no wheezing, rales,rhonchi or crepitation. No use of accessory muscles of respiration.  Decreased bibasilar  breath sounds CARDIOVASCULAR: S1, S2 normal. No murmurs, rubs, or gallops.  ABDOMEN: Soft, generalized discomfort all over on palpation, however tender in the epigastric region with no guarding or rigidity, nondistended. Bowel sounds present. No organomegaly or mass.  EXTREMITIES: No pedal edema, cyanosis, or clubbing.  NEUROLOGIC: Cranial nerves II through XII are intact. Muscle strength 5/5 in all extremities. Sensation intact. Gait not checked.  PSYCHIATRIC: The patient is alert and oriented x 3.  SKIN: No obvious rash, lesion, or ulcer.    LABORATORY PANEL:   CBC Recent Labs  Lab 06/03/19 0555  WBC 6.4  HGB 9.5*  HCT 30.5*  PLT 198   ------------------------------------------------------------------------------------------------------------------  Chemistries  Recent Labs  Lab 06/02/19 1333 06/03/19 0555  NA 135 139  K 3.7 3.3*  CL 103 107  CO2 22 27  GLUCOSE 122* 94  BUN 12 11  CREATININE 1.04* 1.16*  CALCIUM 9.5 8.7*  AST 17  --   ALT 18  --   ALKPHOS 50  --   BILITOT 0.5  --    ------------------------------------------------------------------------------------------------------------------  Cardiac Enzymes No results for input(s): TROPONINI in the last 168 hours. ------------------------------------------------------------------------------------------------------------------  RADIOLOGY:  No results found.  EKG:   Orders placed or performed during the hospital encounter of 06/01/19  . ED EKG  . ED EKG  . EKG    ASSESSMENT AND PLAN:   73 year old female with past medical history significant for smoking, hypertension, GERD, depression presents to hospital secondary to nausea and vomiting with some epigastric pain  1.  Acute gastroenteritis- could be viral -CT abd showing diverticulosis without evidence of diverticulitis.  No abdominal tenderness noted. -We will continue supportive management with IV fluids. -Nausea medications empirically for  now  and advance diet as tolerated.  2.  Hypokalemia-being replaced  3.  Hypothyroidism-Synthroid  4.  GERD-Protonix  5.  DVT prophylaxis-on Lovenox  6.  Depression-on Wellbutrin and Celexa  Patient is independent at baseline   All the records are reviewed and case discussed with Care Management/Social Workerr. Management plans discussed with the patient, family and they are in agreement.  CODE STATUS: Full code  TOTAL TIME TAKING CARE OF THIS PATIENT: 37 minutes.   POSSIBLE D/C IN 1-2 DAYS, DEPENDING ON CLINICAL CONDITION.   Gladstone Lighter M.D on 06/03/2019 at 1:29 PM  Between 7am to 6pm - Pager - [phone]  After 6pm go to [email or web address] - password [email or web address] [facility]  Office  [phone]  CC: Primary care physician; Barbaraann Boys, MD

## 2019-06-04 LAB — BASIC METABOLIC PANEL
Anion gap: 5 (ref 5–15)
BUN: 13 mg/dL (ref 8–23)
CO2: 25 mmol/L (ref 22–32)
Calcium: 8.6 mg/dL — ABNORMAL LOW (ref 8.9–10.3)
Chloride: 110 mmol/L (ref 98–111)
Creatinine, Ser: 1.22 mg/dL — ABNORMAL HIGH (ref 0.44–1.00)
GFR calc Af Amer: 52 mL/min — ABNORMAL LOW (ref >60)
GFR calc non Af Amer: 45 mL/min — ABNORMAL LOW (ref >60)
Glucose, Bld: 93 mg/dL (ref 70–99)
Potassium: 4.4 mmol/L (ref 3.5–5.1)
Sodium: 140 mmol/L (ref 135–145)

## 2019-06-04 MED ORDER — PREGABALIN 100 MG PO CAPS: 100.0000 mg | ORAL_CAPSULE | Freq: Three times a day (TID) | ORAL | 1 refills | Status: DC

## 2019-06-04 MED ORDER — DOCUSATE SODIUM 100 MG PO CAPS
100.0000 mg | ORAL_CAPSULE | Freq: Two times a day (BID) | ORAL | Status: DC
  Administered 2019-06-04: 10:00:00 100 mg via ORAL
  Filled 2019-06-04: qty 1

## 2019-06-04 NOTE — Discharge Summary (Signed)
[facility] [place] [facility]   PATIENT NAME: Sherri Peters    MR#:  [id number]  DATE OF BIRTH:  [birth year]  DATE OF ADMISSION:  06/02/2019   ADMITTING PHYSICIAN: Ihor AustinPavan Pyreddy, MD  DATE OF DISCHARGE:  06/04/19  PRIMARY CARE PHYSICIAN: Orene DesanctisBehling, Karen, MD   ADMISSION DIAGNOSIS:   Epigastric pain [R10.13] Intractable vomiting with nausea, unspecified vomiting type [R11.2]  DISCHARGE DIAGNOSIS:   Active Problems:   Nausea and vomiting in adult   SECONDARY DIAGNOSIS:   Past Medical History:  Diagnosis Date  . Arthritis   . Chronic kidney disease   . Depression   . GERD (gastroesophageal reflux disease)   . Hyperlipidemia   . Hypertension     HOSPITAL COURSE:   73 year old female with past medical history significant for smoking, hypertension, GERD, depression presents to hospital secondary to nausea and vomiting with some epigastric pain  1.  Acute gastroenteritis- could be viral likely -CT abd showing diverticulosis without evidence of diverticulitis.  No abdominal tenderness noted. -received supportive management with IV fluids. No antibiotics - no diarrhea -Nausea medications given here, diet advanced and she is tolerating well  2.  Hypokalemia-replaced  3.  Hypothyroidism-Synthroid  4.  GERD-PPI  5.  Depression-on Wellbutrin and Celexa  6.  Tobacco use disorder-patient just quit smoking cold [place] 3 weeks ago.  7.  Hypertension-known Micardis  Patient is independent at baseline.  Medically stable for discharge today  DISCHARGE CONDITIONS:   Guarded  CONSULTS OBTAINED:   None  DRUG ALLERGIES:   Allergies  Allergen Reactions  . Levaquin [Levofloxacin] Other (See Comments)    hallucinations  . Gabapentin Swelling  . Meloxicam Swelling   DISCHARGE MEDICATIONS:   Allergies as of 06/04/2019      Reactions   Levaquin [levofloxacin] Other (See Comments)   hallucinations   Gabapentin Swelling   Meloxicam Swelling       Medication List    STOP taking these medications   amoxicillin-clavulanate 875-125 MG tablet Commonly known as: AUGMENTIN     TAKE these medications   aspirin EC 81 MG tablet Take 81 mg by mouth daily.   atorvastatin 40 MG tablet Commonly known as: LIPITOR TAKE 1 TABLET BY MOUTH  DAILY   azelastine 0.1 % nasal spray Commonly known as: ASTELIN Place 1 spray into both nostrils 2 (two) times a day.   B-complex with vitamin C tablet Take 1 tablet by mouth daily.   buPROPion 150 MG 24 hr tablet Commonly known as: WELLBUTRIN XL Take 150 mg by mouth daily.   citalopram 40 MG tablet Commonly known as: CELEXA TAKE 1 TABLET BY MOUTH  DAILY   levothyroxine 75 MCG tablet Commonly known as: SYNTHROID TAKE 1 TABLET BY MOUTH  DAILY What changed: when to take this   omeprazole 20 MG capsule Commonly known as: PRILOSEC TAKE 1 CAPSULE BY MOUTH  DAILY   ondansetron 4 MG disintegrating tablet Commonly known as: Zofran ODT Take 1 tablet (4 mg total) by mouth every 8 (eight) hours as needed.   pregabalin 100 MG capsule Commonly known as: LYRICA Take 1 capsule (100 mg total) by mouth 3 (three) times daily.   telmisartan-hydrochlorothiazide 80-12.5 MG tablet Commonly known as: MICARDIS HCT TAKE 1 TABLET BY MOUTH  DAILY   Vitamin D3 50 MCG (2000 UT) capsule Take 2,000 Units by mouth daily.        DISCHARGE INSTRUCTIONS:   1. PCP f/u in 1-2 weeks  DIET:   Cardiac  diet  ACTIVITY:   Activity as tolerated  OXYGEN:   Home Oxygen: No.  Oxygen Delivery: room air  DISCHARGE LOCATION:   home   If you experience worsening of your admission symptoms, develop shortness of breath, life threatening emergency, suicidal or homicidal thoughts you must seek medical attention immediately by calling 911 or calling your MD immediately  if symptoms less severe.  You Must read complete instructions/literature along with all the possible adverse reactions/side effects for all  the Medicines you take and that have been prescribed to you. Take any new Medicines after you have completely understood and accpet all the possible adverse reactions/side effects.   Please note  You were cared for by a hospitalist during your hospital stay. If you have any questions about your discharge medications or the care you received while you were in the hospital after you are discharged, you can call the unit and asked to speak with the hospitalist on call if the hospitalist that took care of you is not available. Once you are discharged, your primary care physician will handle any further medical issues. Please note that NO REFILLS for any discharge medications will be authorized once you are discharged, as it is imperative that you return to your primary care physician (or establish a relationship with a primary care physician if you do not have one) for your aftercare needs so that they can reassess your need for medications and monitor your lab values.    On the day of Discharge:  VITAL SIGNS:   Blood pressure 132/64, pulse 62, temperature 98.5 F (36.9 C), temperature source Oral, resp. rate 16, height 5\' 3"  (1.6 m), weight 84.8 kg, SpO2 93 %.  PHYSICAL EXAMINATION:    GENERAL:  73 y.o.-year-old obese patient lying in the bed with no acute distress.  EYES: Pupils equal, round, reactive to light and accommodation. No scleral icterus. Extraocular muscles intact.  HEENT: Head atraumatic, normocephalic. Oropharynx and nasopharynx clear.  NECK:  Supple, no jugular venous distention. No thyroid enlargement, no tenderness.  LUNGS: Normal breath sounds bilaterally, no wheezing, rales,rhonchi or crepitation. No use of accessory muscles of respiration.  Decreased bibasilar breath sounds CARDIOVASCULAR: S1, S2 normal. No murmurs, rubs, or gallops.  ABDOMEN: Soft,nontender, no guarding or rigidity, nondistended. Bowel sounds present. No organomegaly or mass.  EXTREMITIES: No pedal edema,  cyanosis, or clubbing.  NEUROLOGIC: Cranial nerves II through XII are intact. Muscle strength 5/5 in all extremities. Sensation intact. Gait not checked.  PSYCHIATRIC: The patient is alert and oriented x 3.  SKIN: No obvious rash, lesion, or ulcer  DATA REVIEW:   CBC Recent Labs  Lab 06/03/19 0555  WBC 6.4  HGB 9.5*  HCT 30.5*  PLT 198    Chemistries  Recent Labs  Lab 06/02/19 1333  06/04/19 0644  NA 135   < > 140  K 3.7   < > 4.4  CL 103   < > 110  CO2 22   < > 25  GLUCOSE 122*   < > 93  BUN 12   < > 13  CREATININE 1.04*   < > 1.22*  CALCIUM 9.5   < > 8.6*  AST 17  --   --   ALT 18  --   --   ALKPHOS 50  --   --   BILITOT 0.5  --   --    < > = values in this interval not displayed.     Microbiology Results  Results  for orders placed or performed during the hospital encounter of 06/01/19  SARS Coronavirus 2 (CEPHEID- Performed in [facility] hospital lab), Hosp Order     Status: None   Collection Time: 06/01/19  4:30 AM   Specimen: Nasopharyngeal Swab  Result Value Ref Range Status   SARS Coronavirus 2 NEGATIVE NEGATIVE Final    Comment: (NOTE) If result is NEGATIVE SARS-CoV-2 target nucleic acids are NOT DETECTED. The SARS-CoV-2 RNA is generally detectable in upper and lower  respiratory specimens during the acute phase of infection. The lowest  concentration of SARS-CoV-2 viral copies this assay can detect is 250  copies / mL. A negative result does not preclude SARS-CoV-2 infection  and should not be used as the sole basis for treatment or other  patient management decisions.  A negative result Geiler occur with  improper specimen collection / handling, submission of specimen other  than nasopharyngeal swab, presence of viral mutation(s) within the  areas targeted by this assay, and inadequate number of viral copies  (<250 copies / mL). A negative result must be combined with clinical  observations, patient history, and epidemiological information. If result  is POSITIVE SARS-CoV-2 target nucleic acids are DETECTED. The SARS-CoV-2 RNA is generally detectable in upper and lower  respiratory specimens dur ing the acute phase of infection.  Positive  results are indicative of active infection with SARS-CoV-2.  Clinical  correlation with patient history and other diagnostic information is  necessary to determine patient infection status.  Positive results do  not rule out bacterial infection or co-infection with other viruses. If result is PRESUMPTIVE POSTIVE SARS-CoV-2 nucleic acids Yaun BE PRESENT.   A presumptive positive result was obtained on the submitted specimen  and confirmed on repeat testing.  While 2019 novel coronavirus  (SARS-CoV-2) nucleic acids Canoy be present in the submitted sample  additional confirmatory testing Farooq be necessary for epidemiological  and / or clinical management purposes  to differentiate between  SARS-CoV-2 and other Sarbecovirus currently known to infect humans.  If clinically indicated additional testing with an alternate test  methodology [phone] is advised. The SARS-CoV-2 RNA is generally  detectable in upper and lower respiratory sp ecimens during the acute  phase of infection. The expected result is Negative. Fact Sheet for Patients:  BoilerBrush.com.cyhttps://www.fda.gov/media/136312/download Fact Sheet for Healthcare Providers: https://pope.com/https://www.fda.gov/media/136313/download This test is not yet approved or cleared by the [place] FDA and has been authorized for detection and/or diagnosis of SARS-CoV-2 by FDA under an Emergency Use Authorization (EUA).  This EUA will remain in effect (meaning this test can be used) for the duration of the COVID-19 declaration under Section 564(b)(1) of the Act, 21 U.S.C. section 360bbb-3(b)(1), unless the authorization is terminated or revoked sooner. Performed at [facility], [address] [place], [place] [address]     RADIOLOGY:  No results found.    Management plans discussed with the patient, family and they are in agreement.  CODE STATUS:     Code Status Orders  (From admission, onward)         Start     Ordered   06/02/19 1552  Full code  Continuous     06/02/19 1551        Code Status History    This patient has a current code status but no historical code status.   Advance Care Planning Activity      TOTAL TIME TAKING CARE OF THIS PATIENT: 38 minutes.    Enid Baasadhika Octave Montrose M.D on 06/04/2019  at 3:28 PM  Between 7am to 6pm - Pager - [phone]  After 6pm go to [email or web address] - Social research officer, governmentpassword [email or web address] [email or web address]  [facility] [place] [facility]  Office  [phone]  CC: Primary care physician; Orene DesanctisBehling, Karen, MD   Note: This dictation was prepared with Dragon dictation along with smaller phrase technology. Any transcriptional errors that result from this process are unintentional.

## 2019-09-18 DIAGNOSIS — M4716 Other spondylosis with myelopathy, lumbar region: Secondary | ICD-10-CM | POA: Insufficient documentation

## 2019-11-07 HISTORY — PX: HERNIA REPAIR: SHX51

## 2019-11-07 HISTORY — PX: JOINT REPLACEMENT: SHX530

## 2019-11-09 DIAGNOSIS — M180 Bilateral primary osteoarthritis of first carpometacarpal joints: Secondary | ICD-10-CM | POA: Insufficient documentation

## 2019-12-08 HISTORY — PX: CHOLECYSTECTOMY: SHX55

## 2020-06-03 HISTORY — PX: TOTAL KNEE ARTHROPLASTY: SHX125

## 2021-02-15 DIAGNOSIS — Z720 Tobacco use: Secondary | ICD-10-CM | POA: Insufficient documentation

## 2021-02-15 DIAGNOSIS — N2581 Secondary hyperparathyroidism of renal origin: Secondary | ICD-10-CM | POA: Insufficient documentation

## 2021-03-10 NOTE — Progress Notes (Signed)
[facility] [address] [place], [place] [address]  Pulmonary Sleep Medicine   Office Visit Note  Patient Name: Sherri Peters DOB: 04/22/1946 MRN [id number]    Chief Complaint: Obstructive Sleep Apnea visit  Brief History:  [name] is seen today for OSA.  The patient has a 6 year history of sleep apnea. Patient is using PAP nightly.  The patient feels better after sleeping with PAP.  The patient reports dry mouth  from PAP use. Reported sleepiness is  Ongoing  and the Epworth Sleepiness Score is 13 out of 24. The patient does not  take naps.  The compliance download shows excellent  compliance with an average use time of 8:21 hours. The AHI is 3.6  The patient does complain  of limb movements disrupting sleep.  ROS  General: (-) fever, (-) chills, (-) night sweat Nose and Sinuses: (-) nasal stuffiness or itchiness, (-) postnasal drip, (-) nosebleeds, (-) sinus trouble. Mouth and Throat: (-) sore throat, (-) hoarseness. Neck: (-) swollen glands, (-) enlarged thyroid, (-) neck pain. Respiratory: - cough, - shortness of breath, - wheezing. Neurologic: - numbness, - tingling. Psychiatric: - anxiety, - depression   Current Medication: Outpatient Encounter Medications as of 03/14/2021  Medication Sig  . aspirin EC 81 MG tablet Take 81 mg by mouth daily.  Marland Kitchen atorvastatin (LIPITOR) 40 MG tablet TAKE 1 TABLET BY MOUTH  DAILY (Patient taking differently: Take 40 mg by mouth daily. )  . azelastine (ASTELIN) 0.1 % nasal spray Place 1 spray into both nostrils 2 (two) times a day.  . B Complex-C (B-COMPLEX WITH VITAMIN C) tablet Take 1 tablet by mouth daily.  Marland Kitchen buPROPion (WELLBUTRIN XL) 150 MG 24 hr tablet Take 150 mg by mouth daily.  . Cholecalciferol (VITAMIN D3) 50 MCG (2000 UT) capsule Take 2,000 Units by mouth daily.  . citalopram (CELEXA) 40 MG tablet TAKE 1 TABLET BY MOUTH  DAILY (Patient taking differently: Take 40 mg by mouth daily. )  . levothyroxine (SYNTHROID,  LEVOTHROID) 75 MCG tablet TAKE 1 TABLET BY MOUTH  DAILY (Patient taking differently: Take 75 mcg by mouth daily before breakfast. )  . omeprazole (PRILOSEC) 20 MG capsule TAKE 1 CAPSULE BY MOUTH  DAILY (Patient taking differently: Take 20 mg by mouth daily. )  . ondansetron (ZOFRAN ODT) 4 MG disintegrating tablet Take 1 tablet (4 mg total) by mouth every 8 (eight) hours as needed.  . pregabalin (LYRICA) 100 MG capsule Take 1 capsule (100 mg total) by mouth 3 (three) times daily.  Marland Kitchen telmisartan-hydrochlorothiazide (MICARDIS HCT) 80-12.5 MG tablet TAKE 1 TABLET BY MOUTH  DAILY   No facility-administered encounter medications on file as of 03/14/2021.    Surgical History: Past Surgical History:  Procedure Laterality Date  . ABDOMINAL HYSTERECTOMY    . CHOLECYSTECTOMY    . HERNIA REPAIR      Medical History: Past Medical History:  Diagnosis Date  . Arthritis   . Chronic kidney disease   . Depression   . GERD (gastroesophageal reflux disease)   . Hyperlipidemia   . Hypertension     Family History: Non contributory to the present illness  Social History: Social History   Socioeconomic History  . Marital status: Married    Spouse name: Not on file  . Number of children: Not on file  . Years of education: Not on file  . Highest education level: Not on file  Occupational History  . Not on file  Tobacco Use  . Smoking  status: Current Every Day Smoker    Packs/day: 1.00    Types: Cigarettes    Last attempt to quit: 07/20/2016    Years since quitting: 4.6  . Smokeless tobacco: Never Used  Substance and Sexual Activity  . Alcohol use: No  . Drug use: No  . Sexual activity: Not on file  Other Topics Concern  . Not on file  Social History Narrative  . Not on file   Social Determinants of Health   Financial Resource Strain: Not on file  Food Insecurity: Not on file  Transportation Needs: Not on file  Physical Activity: Not on file  Stress: Not on file  Social  Connections: Not on file  Intimate Partner Violence: Not on file    Vital Signs: Blood pressure 117/84, pulse 90, temperature 98 F (36.7 C), temperature source Temporal, resp. rate 16, height 5\' 3"  (1.6 m), weight 187 lb (84.8 kg), SpO2 92 %.  Examination: General Appearance: The patient is well-developed, well-nourished, and in no distress. Neck Circumference: 39 Skin: Gross inspection of skin unremarkable. Head: normocephalic, no gross deformities. Eyes: no gross deformities noted. ENT: ears appear grossly normal Neurologic: Alert and oriented. No involuntary movements.    EPWORTH SLEEPINESS SCALE:  Scale:  (0)= no chance of dozing; (1)= slight chance of dozing; (2)= moderate chance of dozing; (3)= high chance of dozing  Chance  Situtation    Sitting and reading: 3    Watching TV: 3    Sitting Inactive in public: 0    As a passenger in car: 3      Lying down to rest: 2    Sitting and talking: 0    Sitting quielty after lunch: 2    In a car, stopped in traffic: 0   TOTAL SCORE:   13 out of 24    SLEEP STUDIES:  1. PSG 02/25/15 - AHI of 15; REM AHI 30; RERA 21; Low SpO2 low 84%   CPAP COMPLIANCE DATA:  Date Range: 02/16/20 - 02/15/20  Average Daily Use: 8:21 hours  Median Use: 8:50 hours  Compliance for > 4 Hours: 96%  AHI: 3.6 respiratory events per hour  Days Used: 358/365 days  Mask Leak: 6.2  95th Percentile Pressure: 9 cmH2O         LABS: No results found for this or any previous visit (from the past 2160 hour(s)).  Radiology: No results found.  No results found.  No results found.    Assessment and Plan: Patient Active Problem List   Diagnosis Date Noted  . CPAP use counseling 03/14/2021  . Nausea and vomiting in adult 06/02/2019  . Carpal tunnel syndrome of left wrist 06/07/2016  . Restless leg syndrome 12/14/2015  . Arthritis of knee 12/14/2015  . Shingles 08/23/2015  . OSA on CPAP 05/18/2015  . Hyperlipidemia    . Hypothyroid   . Hypertension   . Depression    1. OSA on CPAP The patient does  tolerate PAP and reports clear  benefit from PAP use. The patient was reminded how to clean equipment  and advised to start using the humidity in her cpap to alleviate her dry mouth. She was shown how to add water to her water chamber. . The patient was also counselled on weight loss. The compliance is excellent. The AHI is 3.6. OSA- continue excellent compliance with CPAP at 9 cm H20  2. CPAP use counseling CPAP Counseling: had a lengthy discussion with the patient regarding the importance of PAP  therapy in management of the sleep apnea. Patient appears to understand the risk factor reduction and also understands the risks associated with untreated sleep apnea. Patient will try to make a good faith effort to remain compliant with therapy. Also instructed the patient on proper cleaning of the device including the water must be changed daily if possible and use of distilled water is preferred. Patient understands that the machine should be regularly cleaned with appropriate recommended cleaning solutions that do not damage the PAP machine for example given white vinegar and water rinses. Other methods such as ozone treatment Arndt not be as good as these simple methods to achieve cleaning.  3. Restless leg syndrome Stable, continue to monitor.   4. Primary hypertension Hypertension Counseling:   The following hypertensive lifestyle modification were recommended and discussed:  1. Limiting alcohol intake to less than 1 oz/day of ethanol:(24 oz of beer or 8 oz of wine or 2 oz of 100-proof whiskey). 2. Take baby ASA 81 mg daily. 3. Importance of regular aerobic exercise and losing weight. 4. Reduce dietary saturated fat and cholesterol intake for overall cardiovascular health. 5. Maintaining adequate dietary potassium, calcium, and magnesium intake. 6. Regular monitoring of the blood pressure. 7. Reduce sodium intake  to less than 100 mmol/day (less than 2.3 gm of sodium or less than 6 gm of sodium choride)      General Counseling: I have discussed the findings of the evaluation and examination with Kendal Hymen.  I have also discussed any further diagnostic evaluation thatmay be needed or ordered today. Jobina verbalizes understanding of the findings of todays visit. We also reviewed her medications today and discussed drug interactions and side effects including but not limited excessive drowsiness and altered mental states. We also discussed that there is always a risk not just to her but also people around her. she has been encouraged to call the office with any questions or concerns that should arise related to todays visit.  No orders of the defined types were placed in this encounter.       I have personally obtained a history, examined the patient, evaluated laboratory and imaging results, formulated the assessment and plan and placed orders.  This patient was seen today by Emmaline Kluver, PA-C in collaboration with Dr. Freda Munro.   Valentino Hue Sol Blazing, PhD, FAASM  Diplomate, American Board of Sleep Medicine    Yevonne Pax, MD [facility] Diplomate ABMS Pulmonary and Critical Care Medicine Sleep medicine

## 2021-03-14 ENCOUNTER — Ambulatory Visit (INDEPENDENT_AMBULATORY_CARE_PROVIDER_SITE_OTHER): Payer: Medicare PPO | Admitting: Internal Medicine

## 2021-03-14 VITALS — BP 117/84 | HR 90 | Temp 98.0°F | Resp 16 | Ht 63.0 in | Wt 187.0 lb

## 2021-03-14 DIAGNOSIS — Z9989 Dependence on other enabling machines and devices: Secondary | ICD-10-CM

## 2021-03-14 DIAGNOSIS — Z7189 Other specified counseling: Secondary | ICD-10-CM | POA: Diagnosis not present

## 2021-03-14 DIAGNOSIS — G2581 Restless legs syndrome: Secondary | ICD-10-CM | POA: Diagnosis not present

## 2021-03-14 DIAGNOSIS — I10 Essential (primary) hypertension: Secondary | ICD-10-CM | POA: Diagnosis not present

## 2021-03-14 DIAGNOSIS — G4733 Obstructive sleep apnea (adult) (pediatric): Secondary | ICD-10-CM

## 2021-03-14 NOTE — Patient Instructions (Signed)
CPAP and BPAP Information CPAP and BPAP are methods that use air pressure to keep your airways open and to help you breathe well. CPAP and BPAP use different amounts of pressure. Your health care provider will tell you whether CPAP or BPAP would be more helpful for you.  CPAP stands for "continuous positive airway pressure." With CPAP, the amount of pressure stays the same while you breathe in and out.  BPAP stands for "bi-level positive airway pressure." With BPAP, the amount of pressure will be higher when you breathe in (inhale) and lower when you breathe out(exhale). This allows you to take larger breaths. CPAP or BPAP Lietzke be used in the hospital, or your health care provider Hailu want you to use it at home. You Houde need to have a sleep study before your health care provider can order a machine for you to use at home. Why are CPAP and BPAP treatments used? CPAP or BPAP can be helpful if you have:  Sleep apnea.  Chronic obstructive pulmonary disease (COPD).  Heart failure.  Medical conditions that cause muscle weakness, including muscular dystrophy or amyotrophic lateral sclerosis (ALS).  Other problems that cause breathing to be shallow, weak, abnormal, or difficult. CPAP and BPAP are most commonly used for obstructive sleep apnea (OSA) to keep the airways from collapsing when the muscles relax during sleep. How is CPAP or BPAP administered? Both CPAP and BPAP are provided by a small machine with a flexible plastic tube that attaches to a plastic mask that you wear. Air is blown through the mask into your nose or mouth. The amount of pressure that is used to blow the air can be adjusted on the machine. Your health care provider will set the pressure setting and help you find the best mask for you. When should CPAP or BPAP be used? In most cases, the mask only needs to be worn during sleep. Generally, the mask needs to be worn throughout the night and during any daytime naps. People with  certain medical conditions Hilbun also need to wear the mask at other times when they are awake. Follow instructions from your health care provider about when to use the machine. What are some tips for using the mask?  Because the mask needs to be snug, some people feel trapped or closed-in (claustrophobic) when first using the mask. If you feel this way, you Gau need to get used to the mask. One way to do this is to hold the mask loosely over your nose or mouth and then gradually apply the mask more snugly. You can also gradually increase the amount of time that you use the mask.  Masks are available in various types and sizes. If your mask does not fit well, talk with your health care provider about getting a different one. Some common types of masks include: ? Full face masks, which fit over the mouth and nose. ? Nasal masks, which fit over the nose. ? Nasal pillow or prong masks, which fit into the nostrils.  If you are using a mask that fits over your nose and you tend to breathe through your mouth, a chin strap Travelstead be applied to help keep your mouth closed.  Some CPAP and BPAP machines have alarms that Tiffany sound if the mask comes off or develops a leak.  If you have trouble with the mask, it is very important that you talk with your health care provider about finding a way to make the mask easier to   tolerate. Do not stop using the mask. There could be a negative impact to your health if you stop using the mask.   What are some tips for using the machine?  Place your CPAP or BPAP machine on a secure table or stand near an electrical outlet.  Know where the on/off switch is on the machine.  Follow instructions from your health care provider about how to set the pressure on your machine and when you should use it.  Do not eat or drink while the CPAP or BPAP machine is on. Food or fluids could get pushed into your lungs by the pressure of the CPAP or BPAP.  For home use, CPAP and BPAP  machines can be rented or purchased through home health care companies. Many different brands of machines are available. Renting a machine before purchasing Campanile help you find out which particular machine works well for you. Your insurance Antonacci also decide which machine you Levick get.  Keep the CPAP or BPAP machine and attachments clean. Ask your health care provider for specific instructions. Follow these instructions at home:  Do not use any products that contain nicotine or tobacco, such as cigarettes, e-cigarettes, and chewing tobacco. If you need help quitting, ask your health care provider.  Keep all follow-up visits as told by your health care provider. This is important. Contact a health care provider if:  You have redness or pressure sores on your head, face, mouth, or nose from the mask or head gear.  You have trouble using the CPAP or BPAP machine.  You cannot tolerate wearing the CPAP or BPAP mask.  Someone tells you that you snore even when wearing your CPAP or BPAP. Get help right away if:  You have trouble breathing.  You feel confused. Summary  CPAP and BPAP are methods that use air pressure to keep your airways open and to help you breathe well.  You Fiero need to have a sleep study before your health care provider can order a machine for home use.  If you have trouble with the mask, it is very important that you talk with your health care provider about finding a way to make the mask easier to tolerate. Do not stop using the mask. There could be a negative impact to your health if you stop using the mask.  Follow instructions from your health care provider about when to use the machine. This information is not intended to replace advice given to you by your health care provider. Make sure you discuss any questions you have with your health care provider. Document Revised: 11/14/2019 Document Reviewed: 11/17/2019 Elsevier Patient Education  2021 Elsevier Inc.  

## 2021-05-05 DIAGNOSIS — M797 Fibromyalgia: Secondary | ICD-10-CM | POA: Insufficient documentation

## 2021-07-31 ENCOUNTER — Other Ambulatory Visit: Payer: Self-pay

## 2021-07-31 ENCOUNTER — Encounter: Payer: Self-pay | Admitting: Emergency Medicine

## 2021-07-31 ENCOUNTER — Emergency Department
Admission: EM | Admit: 2021-07-31 | Discharge: 2021-07-31 | Disposition: A | Discharge status: Home / Self Care | Payer: Medicare PPO | Attending: Emergency Medicine | Admitting: Emergency Medicine

## 2021-07-31 ENCOUNTER — Emergency Department: Payer: Medicare PPO

## 2021-07-31 DIAGNOSIS — E039 Hypothyroidism, unspecified: Secondary | ICD-10-CM | POA: Insufficient documentation

## 2021-07-31 DIAGNOSIS — R0602 Shortness of breath: Secondary | ICD-10-CM | POA: Diagnosis present

## 2021-07-31 DIAGNOSIS — J189 Pneumonia, unspecified organism: Secondary | ICD-10-CM

## 2021-07-31 DIAGNOSIS — F17210 Nicotine dependence, cigarettes, uncomplicated: Secondary | ICD-10-CM | POA: Diagnosis not present

## 2021-07-31 DIAGNOSIS — Z79899 Other long term (current) drug therapy: Secondary | ICD-10-CM | POA: Diagnosis not present

## 2021-07-31 DIAGNOSIS — I129 Hypertensive chronic kidney disease with stage 1 through stage 4 chronic kidney disease, or unspecified chronic kidney disease: Secondary | ICD-10-CM | POA: Diagnosis not present

## 2021-07-31 DIAGNOSIS — Z7982 Long term (current) use of aspirin: Secondary | ICD-10-CM | POA: Insufficient documentation

## 2021-07-31 DIAGNOSIS — N189 Chronic kidney disease, unspecified: Secondary | ICD-10-CM | POA: Diagnosis not present

## 2021-07-31 DIAGNOSIS — J168 Pneumonia due to other specified infectious organisms: Secondary | ICD-10-CM | POA: Insufficient documentation

## 2021-07-31 LAB — PROCALCITONIN: Procalcitonin: 2.74 ng/mL

## 2021-07-31 LAB — BASIC METABOLIC PANEL
Anion gap: 9 (ref 5–15)
BUN: 22 mg/dL (ref 8–23)
CO2: 22 mmol/L (ref 22–32)
Calcium: 9 mg/dL (ref 8.9–10.3)
Chloride: 101 mmol/L (ref 98–111)
Creatinine, Ser: 1.11 mg/dL — ABNORMAL HIGH (ref 0.44–1.00)
GFR, Estimated: 52 mL/min — ABNORMAL LOW (ref >60)
Glucose, Bld: 94 mg/dL (ref 70–99)
Potassium: 4.3 mmol/L (ref 3.5–5.1)
Sodium: 132 mmol/L — ABNORMAL LOW (ref 135–145)

## 2021-07-31 LAB — CBC WITH DIFFERENTIAL/PLATELET
Abs Immature Granulocytes: 0.17 10*3/uL — ABNORMAL HIGH (ref 0.00–0.07)
Basophils Absolute: 0 10*3/uL (ref 0.0–0.1)
Basophils Relative: 0 %
Eosinophils Absolute: 0 10*3/uL (ref 0.0–0.5)
Eosinophils Relative: 0 %
HCT: 31.8 % — ABNORMAL LOW (ref 36.0–46.0)
Hemoglobin: 10.5 g/dL — ABNORMAL LOW (ref 12.0–15.0)
Immature Granulocytes: 2 %
Lymphocytes Relative: 12 %
Lymphs Abs: 1.3 10*3/uL (ref 0.7–4.0)
MCH: 28.8 pg (ref 26.0–34.0)
MCHC: 33 g/dL (ref 30.0–36.0)
MCV: 87.4 fL (ref 80.0–100.0)
Monocytes Absolute: 1.4 10*3/uL — ABNORMAL HIGH (ref 0.1–1.0)
Monocytes Relative: 13 %
Neutro Abs: 7.7 10*3/uL (ref 1.7–7.7)
Neutrophils Relative %: 73 %
Platelets: 320 10*3/uL (ref 150–400)
RBC: 3.64 MIL/uL — ABNORMAL LOW (ref 3.87–5.11)
RDW: 17.2 % — ABNORMAL HIGH (ref 11.5–15.5)
WBC: 10.5 10*3/uL (ref 4.0–10.5)
nRBC: 0 % (ref 0.0–0.2)

## 2021-07-31 MED ORDER — ALBUTEROL SULFATE HFA 108 (90 BASE) MCG/ACT IN AERS: 2.0000 | INHALATION_SPRAY | Freq: Four times a day (QID) | RESPIRATORY_TRACT | 2 refills | Status: DC | PRN

## 2021-07-31 MED ORDER — ONDANSETRON HCL 4 MG PO TABS: 4.0000 mg | ORAL_TABLET | Freq: Three times a day (TID) | ORAL | 0 refills | Status: DC | PRN

## 2021-07-31 MED ORDER — AZITHROMYCIN 250 MG PO TABS: ORAL_TABLET | ORAL | 0 refills | Status: AC

## 2021-07-31 MED ORDER — PREDNISONE 10 MG (21) PO TBPK: ORAL_TABLET | ORAL | 0 refills | Status: DC

## 2021-07-31 MED ORDER — AZITHROMYCIN 500 MG PO TABS
500.0000 mg | ORAL_TABLET | Freq: Once | ORAL | Status: AC
  Administered 2021-07-31: 500 mg via ORAL
  Filled 2021-07-31: qty 1

## 2021-07-31 MED ORDER — AMOXICILLIN-POT CLAVULANATE 875-125 MG PO TABS: 1.0000 | ORAL_TABLET | Freq: Two times a day (BID) | ORAL | 0 refills | Status: AC

## 2021-07-31 MED ORDER — AMOXICILLIN-POT CLAVULANATE 875-125 MG PO TABS
1.0000 | ORAL_TABLET | Freq: Once | ORAL | Status: AC
  Administered 2021-07-31: 1 via ORAL
  Filled 2021-07-31: qty 1

## 2021-07-31 NOTE — ED Triage Notes (Signed)
Pt states that she was brought over from [facility] with lingering COVID symptoms. She reports that she was started on Paxlovid and thinks that she Stream have pneumonia because she is coughing up thick phlem (yellow in color) She has had this all along just feels that she isnt getting any better

## 2021-07-31 NOTE — ED Provider Notes (Signed)
[facility] Emergency Department Provider Note  ____________________________________________   I have reviewed the triage vital signs and the nursing notes.   HISTORY  Chief Complaint Shortness of breath   History limited by: Not Limited   HPI Sherri Peters is a 75 y.o. female who presents to the emergency department today because of concerns for continued and possible worsening symptoms since diagnosis of COVID 12 days ago. The patient states that she has been having shortness of breath and some cough since she first tested positive for COVID. She says over the past few days these symptoms Delapaz have gotten worse and her cough is productive of yellow phlegm. She does state that she feels like it might be dripping down from her nose. She did call her doctor after she tested positive and was given prescription for paxlovid but only took a few doses because it was causing her to feel bad. The patient says she has had low grade fevers.    Records reviewed. Per medical record review patient has a history of GERD, HLD, HTN.   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 Authorizing Provider  aspirin EC 81 MG tablet Take 81 mg by mouth daily.    [provider]  atorvastatin  (LIPITOR) 40 MG tablet TAKE 1 TABLET BY MOUTH  DAILY Patient taking differently: Take 40 mg by mouth daily.  12/17/17   Steele Sizer, MD  azelastine (ASTELIN) 0.1 % nasal spray Place 1 spray into both nostrils 2 (two) times a day. 05/28/19   [provider]  B Complex-C (B-COMPLEX WITH VITAMIN C) tablet Take 1 tablet by mouth daily.    [provider]  buPROPion (WELLBUTRIN XL) 150 MG 24 hr tablet Take 150 mg by mouth daily. 05/31/19   [provider]  Cholecalciferol (VITAMIN D3) 50 MCG (2000 UT) capsule Take 2,000 Units by mouth daily.    [provider]  citalopram (CELEXA) 40 MG tablet TAKE 1 TABLET BY MOUTH  DAILY Patient taking differently: Take 40 mg by mouth daily.  12/17/17   Steele Sizer, MD  levothyroxine (SYNTHROID, LEVOTHROID) 75 MCG tablet TAKE 1 TABLET BY MOUTH  DAILY Patient taking differently: Take 75 mcg by mouth daily before breakfast.  12/17/17   Steele Sizer, MD  omeprazole (PRILOSEC) 20 MG capsule TAKE 1 CAPSULE BY MOUTH  DAILY Patient taking differently: Take 20 mg by mouth daily.  12/17/17   Steele Sizer, MD  ondansetron (ZOFRAN ODT) 4 MG disintegrating tablet Take 1 tablet (4 mg total) by mouth every 8 (eight) hours as needed. 06/01/19   Darci Current, MD  pregabalin (LYRICA) 100  MG capsule Take 1 capsule (100 mg total) by mouth 3 (three) times daily. 06/04/19   Enid Baas, MD  telmisartan-hydrochlorothiazide (MICARDIS HCT) 80-12.5 MG tablet TAKE 1 TABLET BY MOUTH  DAILY 12/17/17   Steele Sizer, MD    Allergies Levaquin [levofloxacin], Gabapentin, and Meloxicam  Family History  Problem Relation Age of Onset   Heart disease Mother    Hypertension Mother    AAA (abdominal aortic aneurysm) Mother    Stroke Father    Heart disease Father    Hypertension Father    Aneurysm Sister     Social History Social History   Tobacco Use   Smoking status: Every Day    Packs/day: 1.00    Types: Cigarettes    Last  attempt to quit: 07/20/2016    Years since quitting: 5.0   Smokeless tobacco: Never  Substance Use Topics   Alcohol use: No   Drug use: No    Review of Systems Constitutional: Positive for low grade fevers.  Eyes: No visual changes. ENT: No sore throat. Cardiovascular: Denies chest pain. Respiratory: Positive for cough and shortness of breath. Gastrointestinal: No abdominal pain.  Positive for nausea.  Genitourinary: Negative for dysuria. Musculoskeletal: Negative for back pain. Skin: Negative for rash. Neurological: Negative for headaches, focal weakness or numbness.  ____________________________________________   PHYSICAL EXAM:  VITAL SIGNS: ED Triage Vitals  Enc Vitals Group     BP 07/31/21 1628 120/60     Pulse Rate 07/31/21 1628 81     Resp 07/31/21 1628 18     Temp 07/31/21 1628 98.2 F (36.8 C)     Temp Source 07/31/21 1628 Oral     SpO2 07/31/21 1628 96 %     Weight 07/31/21 1627 184 lb (83.5 kg)     Height 07/31/21 1627 5\' 3"  (1.6 m)     Head Circumference --      Peak Flow --      Pain Score 07/31/21 1635 0    Constitutional: Alert and oriented.  Eyes: Conjunctivae are normal.  ENT      Head: Normocephalic and atraumatic.      Nose: No congestion/rhinnorhea.      Mouth/Throat: Mucous membranes are moist.      Neck: No stridor. Hematological/Lymphatic/Immunilogical: No cervical lymphadenopathy. Cardiovascular: Normal rate, regular rhythm.  No murmurs, rubs, or gallops.  Respiratory: Normal respiratory effort without tachypnea nor retractions.Occasional dry cough. Some wheezing in right lower lung.  Gastrointestinal: Soft and non tender. No rebound. No guarding.  Genitourinary: Deferred Musculoskeletal: Normal range of motion in all extremities. No lower extremity edema. Neurologic:  Normal speech and language. No gross focal neurologic deficits are appreciated.  Skin:  Skin is warm, dry and intact. No rash noted. Psychiatric: Mood and affect are  normal. Speech and behavior are normal. Patient exhibits appropriate insight and judgment.  ____________________________________________    LABS (pertinent positives/negatives)  BMP wnl except na 132, cr 1.11 CBC wbc 10.5, hgb 10.5, plt 320 Procalcitonin 2.74 ____________________________________________   EKG  None  ____________________________________________    RADIOLOGY  CXR Right middle lobe pneumonia vs atelectasis  ____________________________________________   PROCEDURES  Procedures  ____________________________________________   INITIAL IMPRESSION / ASSESSMENT AND PLAN / ED COURSE  Pertinent labs & imaging results that were available during my care of the patient were reviewed by me and considered in my medical decision making (see chart for details).   Patient presents to the emergency department today because of concern for shortness of  breath, cough in the setting of roughly 12 days out from positive covid test. CXR showed possible pneumonia vs atelectasis. No leukocytosis. Procalcitonin was elevated. At this time will plan on treating for pneumonia. Discussed with patient. Did discuss possibility of inpatient vs outpatient treatment. Offered both and discussed pros and cons of both. At this time patient would prefer outpatient treatment. I think this is reasonable at this time. No oxygen requirement. No hypotension or tachycardia. Will give first dose of antibiotic here. Discussed return precautions.   ____________________________________________   FINAL CLINICAL IMPRESSION(S) / ED DIAGNOSES  Final diagnoses:  Pneumonia due to infectious organism, unspecified laterality, unspecified part of lung     Note: This dictation was prepared with Dragon dictation. Any transcriptional errors that result from this process are unintentional     Phineas Semen, MD 07/31/21 2026

## 2021-07-31 NOTE — ED Triage Notes (Signed)
FIRST NURSE NOTE:  Pt from [facility] via w/c, reports continued COVID sxs dx on 9/13 with COVID started on Paxlovid

## 2021-07-31 NOTE — Discharge Instructions (Addendum)
Please seek medical attention for any high fevers, chest pain, shortness of breath, change in behavior, persistent vomiting, bloody stool or any other new or concerning symptoms.  

## 2021-07-31 NOTE — ED Provider Notes (Signed)
Emergency Medicine Provider Triage Evaluation Note  Sherri Peters, a 75 y.o. female  was evaluated in triage.  Pt complains of continued SOB, cough, and fevers. Diagnosed with Covid via home test on 9/13, started Paxlovid 9/15, dc'd Paxlovid 9/15 due to side effects. Recurrent symptoms onset 2 days ago.   Review of Systems  Positive: Fevers, cough, SOB Negative: CP  Physical Exam  BP 120/60 (BP Location: Right Arm)   Pulse 81   Temp 98.2 F (36.8 C) (Oral)   Resp 18   Ht 5\' 3"  (1.6 m)   Wt 83.5 kg   SpO2 96%   BMI 32.59 kg/m  Gen:   Awake, no distress  NAD Resp:  Normal effort CTA MSK:   Moves extremities without difficulty  Other:  CVS: RRR  Medical Decision Making  Medically screening exam initiated at 4:46 PM.  Appropriate orders placed.  Judine Malczewski was informed that the remainder of the evaluation will be completed by another provider, this initial triage assessment does not replace that evaluation, and the importance of remaining in the ED until their evaluation is complete.  Patient with ED evaluation of cough and chest tightness with recent Covid diagnosis.    Kendal Hymen, PA-C 07/31/21 1652    08/02/21, MD 08/01/21 [phone]

## 2021-08-18 DIAGNOSIS — I2510 Atherosclerotic heart disease of native coronary artery without angina pectoris: Secondary | ICD-10-CM | POA: Insufficient documentation

## 2022-03-09 ENCOUNTER — Encounter: Payer: Self-pay | Admitting: Emergency Medicine

## 2022-03-09 ENCOUNTER — Other Ambulatory Visit: Payer: Self-pay

## 2022-03-09 ENCOUNTER — Ambulatory Visit (INDEPENDENT_AMBULATORY_CARE_PROVIDER_SITE_OTHER): Payer: Medicare PPO

## 2022-03-09 ENCOUNTER — Ambulatory Visit
Admission: EM | Admit: 2022-03-09 | Discharge: 2022-03-09 | Disposition: A | Discharge status: Home / Self Care | Payer: Medicare PPO | Attending: Physician Assistant | Admitting: Physician Assistant

## 2022-03-09 DIAGNOSIS — R0602 Shortness of breath: Secondary | ICD-10-CM

## 2022-03-09 DIAGNOSIS — Z20822 Contact with and (suspected) exposure to covid-19: Secondary | ICD-10-CM | POA: Insufficient documentation

## 2022-03-09 DIAGNOSIS — A084 Viral intestinal infection, unspecified: Secondary | ICD-10-CM | POA: Insufficient documentation

## 2022-03-09 LAB — RESP PANEL BY RT-PCR (FLU A&B, COVID) ARPGX2
Influenza A by PCR: NEGATIVE
Influenza B by PCR: NEGATIVE
SARS Coronavirus 2 by RT PCR: NEGATIVE

## 2022-03-09 MED ORDER — PROMETHAZINE HCL 25 MG/ML IJ SOLN
12.5000 mg | Freq: Once | INTRAMUSCULAR | Status: AC
  Administered 2022-03-09: 12.5 mg via INTRAMUSCULAR

## 2022-03-09 MED ORDER — PROMETHAZINE HCL 12.5 MG PO TABS: 12.5000 mg | ORAL_TABLET | Freq: Four times a day (QID) | ORAL | 0 refills | Status: DC | PRN

## 2022-03-09 MED ORDER — ONDANSETRON HCL 4 MG/2ML IJ SOLN
4.0000 mg | Freq: Once | INTRAMUSCULAR | Status: AC
  Administered 2022-03-09: 4 mg via INTRAMUSCULAR

## 2022-03-09 NOTE — ED Triage Notes (Signed)
Pt c/o nausea, diarrhea, chills, weakness, shortness of breath. Started yesterday morning. Denies fever or vomiting.  ?

## 2022-03-09 NOTE — ED Provider Notes (Addendum)
?[facility] ? ? ? ?CSN: [id number] ?Arrival date & time: 03/09/22  1407 ? ? ?  ? ?History   ?Chief Complaint ?Chief Complaint  ?Patient presents with  ? Diarrhea  ? Nausea  ? ? ?HPI ?Sherri Peters is a 76 y.o. female.  ? ?Patient presents with lower abdominal pain described as tenderness, diarrhea, nausea without vomiting, mild coughing and shortness of breath only when lying down for 1 day.  Associated generalized weakness and fatigue.  Episode of diarrhea occurring today approximately around 12:30 PM.  Minimal food and fluid intake, endorses only eating a few saltine crackers over the last 24 hours.  No known sick contacts.  Has attempted use of Zofran, Pepto and Tylenol which has been ineffective, attempted use of Imodium which has slowed diarrhea.  Home COVID test negative however test was expired.  Completed a virtual visit with her PCP today who recommended in person evaluation at the urgent care.  History of arthritis, CKD, GERD, hyperlipidemia, hypertension, COPD, sleep apnea, hypothyroidism. ? ? ?Past Medical History:  ?Diagnosis Date  ? Arthritis   ? Chronic kidney disease   ? Depression   ? GERD (gastroesophageal reflux disease)   ? Hyperlipidemia   ? Hypertension   ? ? ?Patient Active Problem List  ? Diagnosis Date Noted  ? CPAP use counseling 03/14/2021  ? Nausea and vomiting in adult 06/02/2019  ? Carpal tunnel syndrome of left wrist 06/07/2016  ? Restless leg syndrome 12/14/2015  ? Arthritis of knee 12/14/2015  ? Shingles 08/23/2015  ? OSA on CPAP 05/18/2015  ? Hyperlipidemia   ? Hypothyroid   ? Hypertension   ? Depression   ? ? ?Past Surgical History:  ?Procedure Laterality Date  ? ABDOMINAL HYSTERECTOMY    ? CHOLECYSTECTOMY    ? HERNIA REPAIR    ? ? ?OB History   ?No obstetric history on file. ?  ? ? ? ?Home Medications   ? ?Prior to Admission medications   ?Medication Sig Start Date End Date Taking? Authorizing Provider  ?atorvastatin (LIPITOR) 40 MG tablet TAKE 1 TABLET BY MOUTH   DAILY ?Patient taking differently: Take 40 mg by mouth daily. 12/17/17  Yes Crissman, Redge Gainer, MD  ?azelastine (ASTELIN) 0.1 % nasal spray Place 1 spray into both nostrils 2 (two) times a day. 05/28/19  Yes [provider]  ?B Complex-C (B-COMPLEX WITH VITAMIN C) tablet Take 1 tablet by mouth daily.   Yes [provider]  ?buPROPion (WELLBUTRIN XL) 150 MG 24 hr tablet Take 150 mg by mouth daily. 05/31/19  Yes [provider]  ?Cholecalciferol (VITAMIN D3) 50 MCG (2000 UT) capsule Take 2,000 Units by mouth daily.   Yes [provider]  ?citalopram (CELEXA) 40 MG tablet TAKE 1 TABLET BY MOUTH  DAILY ?Patient taking differently: Take 40 mg by mouth daily. 12/17/17  Yes Crissman, Redge Gainer, MD  ?levothyroxine (SYNTHROID, LEVOTHROID) 75 MCG tablet TAKE 1 TABLET BY MOUTH  DAILY ?Patient taking differently: Take 75 mcg by mouth daily before breakfast. 12/17/17  Yes Crissman, Redge Gainer, MD  ?omeprazole (PRILOSEC) 20 MG capsule TAKE 1 CAPSULE BY MOUTH  DAILY ?Patient taking differently: Take 20 mg by mouth daily. 12/17/17  Yes Crissman, Redge Gainer, MD  ?ondansetron (ZOFRAN) 4 MG tablet Take 1 tablet (4 mg total) by mouth every 8 (eight) hours as needed for vomiting or nausea. 07/31/21  Yes Phineas Semen, MD  ?pregabalin (LYRICA) 100 MG capsule Take 1 capsule (100 mg total) by mouth 3 (  three) times daily. 06/04/19  Yes Enid Baas, MD  ?telmisartan-hydrochlorothiazide (MICARDIS HCT) 80-12.5 MG tablet TAKE 1 TABLET BY MOUTH  DAILY 12/17/17  Yes Crissman, Redge Gainer, MD  ?albuterol (VENTOLIN HFA) 108 (90 Base) MCG/ACT inhaler Inhale 2 puffs into the lungs every 6 (six) hours as needed for wheezing or shortness of breath. 07/31/21   Phineas Semen, MD  ?aspirin EC 81 MG tablet Take 81 mg by mouth daily.    [provider]  ?ondansetron (ZOFRAN ODT) 4 MG disintegrating tablet Take 1 tablet (4 mg total) by mouth every 8 (eight) hours as needed. 06/01/19   Darci Current, MD  ?predniSONE (STERAPRED  UNI-PAK 21 TAB) 10 MG (21) TBPK tablet Per packaging instructions 07/31/21   Phineas Semen, MD  ? ? ?Family History ?Family History  ?Problem Relation Age of Onset  ? Heart disease Mother   ? Hypertension Mother   ? AAA (abdominal aortic aneurysm) Mother   ? Stroke Father   ? Heart disease Father   ? Hypertension Father   ? Aneurysm Sister   ? ? ?Social History ?Social History  ? ?Tobacco Use  ? Smoking status: Every Day  ?  Packs/day: 1.00  ?  Types: Cigarettes  ?  Last attempt to quit: 07/20/2016  ?  Years since quitting: 5.6  ? Smokeless tobacco: Never  ?Vaping Use  ? Vaping Use: Never used  ?Substance Use Topics  ? Alcohol use: No  ? Drug use: No  ? ? ? ?Allergies   ?Levaquin [levofloxacin], Gabapentin, and Meloxicam ? ? ?Review of Systems ?Review of Systems  ?Constitutional: Negative.   ?HENT: Negative.    ?Respiratory:  Positive for cough and shortness of breath. Negative for apnea, choking, chest tightness, wheezing and stridor.   ?Cardiovascular: Negative.   ?Gastrointestinal:  Positive for diarrhea and nausea. Negative for abdominal distention, abdominal pain, anal bleeding, blood in stool, constipation, rectal pain and vomiting.  ?Skin: Negative.   ?Neurological: Negative.   ? ? ?Physical Exam ?Triage Vital Signs ?ED Triage Vitals  ?Enc Vitals Group  ?   BP 03/09/22 1428 (!) 147/79  ?   Pulse Rate 03/09/22 1428 82  ?   Resp 03/09/22 1428 18  ?   Temp 03/09/22 1428 98.3 ?F (36.8 ?C)  ?   Temp Source 03/09/22 1428 Oral  ?   SpO2 03/09/22 1428 96 %  ?   Weight 03/09/22 1424 184 lb 1.4 oz (83.5 kg)  ?   Height 03/09/22 1424 5\' 3"  (1.6 m)  ?   Head Circumference --   ?   Peak Flow --   ?   Pain Score 03/09/22 1423 2  ?   Pain Loc --   ?   Pain Edu? --   ?   Excl. in GC? --   ? ?No data found. ? ?Updated Vital Signs ?BP (!) 147/79 (BP Location: Left Arm)   Pulse 82   Temp 98.3 ?F (36.8 ?C) (Oral)   Resp 18   Ht 5\' 3"  (1.6 m)   Wt 184 lb 1.4 oz (83.5 kg)   SpO2 96%   BMI 32.61 kg/m?  ? ?Visual  Acuity ?Right Eye Distance:   ?Left Eye Distance:   ?Bilateral Distance:   ? ?Right Eye Near:   ?Left Eye Near:    ?Bilateral Near:    ? ?Physical Exam ?Constitutional:   ?   Appearance: She is ill-appearing.  ?HENT:  ?   Head: Normocephalic.  ?Eyes:  ?  Extraocular Movements: Extraocular movements intact.  ?Cardiovascular:  ?   Rate and Rhythm: Normal rate and regular rhythm.  ?   Pulses: Normal pulses.  ?   Heart sounds: Normal heart sounds.  ?Pulmonary:  ?   Effort: Pulmonary effort is normal.  ?   Breath sounds: Normal breath sounds.  ?Abdominal:  ?   General: Abdomen is flat. Bowel sounds are normal.  ?   Palpations: Abdomen is soft.  ?   Tenderness: There is no abdominal tenderness.  ?Skin: ?   General: Skin is warm and dry.  ?Neurological:  ?   Mental Status: She is alert and oriented to person, place, and time. Mental status is at baseline.  ?Psychiatric:     ?   Mood and Affect: Mood normal.     ?   Behavior: Behavior normal.  ? ? ? ?UC Treatments / Results  ?Labs ?(all labs ordered are listed, but only abnormal results are displayed) ?Labs Reviewed  ?RESP PANEL BY RT-PCR (FLU A&B, COVID) [id number]  ? ? ?EKG ? ? ?Radiology ?No results found. ? ?Procedures ?Procedures (including critical care time) ? ?Medications Ordered in UC ?Medications - No data to display ? ?Initial Impression / Assessment and Plan / UC Course  ?I have reviewed the triage vital signs and the nursing notes. ? ?Pertinent labs & imaging results that were available during my care of the patient were reviewed by me and considered in my medical decision making (see chart for details). ? ?Viral gastroenteritis ? ?Vital signs are stable, O2 saturation 96% on room air, lungs are clear and diminished, no tenderness noted on abdominal exam and bowel sounds are normal, low suspicion for an acute abdomen, low suspicion for pneumonia, pneumothorax or bronchitis, discussed with patient, however PCP recommended chest x-ray for patient during her video  visit and patient would like to move for, obliged, chest x-ray negative, discussed findings with patient, etiology of symptoms is most likely a virus, discussed with patient, 4 mg of IM Zofran given in office, medication ineffect

## 2022-03-09 NOTE — Discharge Instructions (Addendum)
I believe your symptoms today are related to a virus and your weakness and fatigue is related to the inability to eat or tolerate liquids ? ?Your chest x-ray was negative for structural changes, signs of fluid or infection ? ?COVID and flu testing negative ? ?Tubbs use Phenergan every 6 hours as needed to help manage nausea, stop use of Zofran ? ?You Nix continue use of  Imodium as this has been effective in slowing your diarrhea, please be mindful of that overuse of this medicine will cause the opposite effect of constipation, take only as directed  ? ?You can use over-the-counter ibuprofen or Tylenol, which ever you have at home, to help manage fevers ? ?Continue to promote hydration throughout the day by using electrolyte replacement solution such as Gatorade, body armor, Pedialyte, which ever you have at home ? ?Try eating bland foods such as bread, rice, toast, fruit which are easier on the stomach to digest, avoid foods that are overly spicy, overly seasoned or greasy  ? ?Holck follow-up with urgent care or PCP if symptoms continue to persist or worsen ?

## 2022-03-13 ENCOUNTER — Ambulatory Visit: Payer: Medicare PPO | Admitting: Internal Medicine

## 2022-03-13 NOTE — Progress Notes (Signed)
Pt did not show for scheduled appointment.  

## 2022-05-14 NOTE — Progress Notes (Signed)
[facility] [address] [place], [place] [address]  Pulmonary Sleep Medicine   Office Visit Note  Patient Name: Sherri Peters DOB: 14-Dec-1945 MRN [id number]    Chief Complaint: Obstructive Sleep Apnea visit  Brief History:  Sherri Peters is seen today for an annual follow up on CPAP@9cmh20 .  The patient has a 7 year  history of sleep apnea. Patient is using PAP nightly.  The patient feels tired in the morning after sleeping with PAP.  The patient reports benefit  from PAP use. Reported sleepiness is  not improved and the Epworth Sleepiness Score is 8 out of 24. The patient does not take naps. The patient complains of the following: waking tired and some dryness of her mouth.  Advised to increase humidity. The compliance download shows 95% compliance with an average use time of 7 hours 42 min The AHI is 7.7  The patient does complain of limb movements disrupting sleep. She has recently started tylenol pm at night, and we are now seeing an increase in her apnea since starting this.   ROS  General: (-) fever, (-) chills, (-) night sweat Nose and Sinuses: (-) nasal stuffiness or itchiness, (-) postnasal drip, (-) nosebleeds, (-) sinus trouble. Mouth and Throat: (-) sore throat, (-) hoarseness. Neck: (-) swollen glands, (-) enlarged thyroid, (-) neck pain. Respiratory: + cough, - shortness of breath, - wheezing. Neurologic: - numbness, - tingling. Psychiatric: - anxiety, - depression   Current Medication: Outpatient Encounter Medications as of 05/15/2022  Medication Sig   levothyroxine (SYNTHROID) 75 MCG tablet Take by mouth.   omeprazole (PRILOSEC) 20 MG capsule Take by mouth.   pregabalin (LYRICA) 100 MG capsule Take 1 capsule by mouth 3 (three) times daily.   [DISCONTINUED] potassium chloride (KLOR-CON) 10 MEQ tablet Take by mouth.   albuterol (VENTOLIN HFA) 108 (90 Base) MCG/ACT inhaler Inhale 2 puffs into the lungs every 6 (six) hours as needed for wheezing or shortness of  breath.   aspirin EC 81 MG tablet Take 81 mg by mouth daily.   atorvastatin (LIPITOR) 40 MG tablet TAKE 1 TABLET BY MOUTH  DAILY (Patient taking differently: Take 40 mg by mouth daily.)   azelastine (ASTELIN) 0.1 % nasal spray Place 1 spray into both nostrils 2 (two) times a day.   B Complex-C (B-COMPLEX WITH VITAMIN C) tablet Take 1 tablet by mouth daily.   buPROPion (WELLBUTRIN XL) 150 MG 24 hr tablet Take 150 mg by mouth daily.   busPIRone (BUSPAR) 5 MG tablet Take by mouth daily.   Cholecalciferol (VITAMIN D3) 50 MCG (2000 UT) capsule Take 2,000 Units by mouth daily.   citalopram (CELEXA) 40 MG tablet TAKE 1 TABLET BY MOUTH  DAILY (Patient taking differently: Take 40 mg by mouth daily.)   levothyroxine (SYNTHROID, LEVOTHROID) 75 MCG tablet TAKE 1 TABLET BY MOUTH  DAILY (Patient taking differently: Take 75 mcg by mouth daily before breakfast.)   omeprazole (PRILOSEC) 20 MG capsule TAKE 1 CAPSULE BY MOUTH  DAILY (Patient taking differently: Take 20 mg by mouth daily.)   ondansetron (ZOFRAN ODT) 4 MG disintegrating tablet Take 1 tablet (4 mg total) by mouth every 8 (eight) hours as needed.   ondansetron (ZOFRAN) 4 MG tablet Take 1 tablet (4 mg total) by mouth every 8 (eight) hours as needed for vomiting or nausea.   pregabalin (LYRICA) 100 MG capsule Take 1 capsule (100 mg total) by mouth 3 (three) times daily.   telmisartan-hydrochlorothiazide (MICARDIS HCT) 80-12.5 MG tablet TAKE 1 TABLET  BY MOUTH  DAILY   [DISCONTINUED] predniSONE (STERAPRED UNI-PAK 21 TAB) 10 MG (21) TBPK tablet Per packaging instructions   [DISCONTINUED] promethazine (PHENERGAN) 12.5 MG tablet Take 1 tablet (12.5 mg total) by mouth every 6 (six) hours as needed for nausea or vomiting.   No facility-administered encounter medications on file as of 05/15/2022.    Surgical History: Past Surgical History:  Procedure Laterality Date   ABDOMINAL HYSTERECTOMY     CHOLECYSTECTOMY     HERNIA REPAIR      Medical  History: Past Medical History:  Diagnosis Date   Arthritis    Chronic kidney disease    Depression    GERD (gastroesophageal reflux disease)    Hyperlipidemia    Hypertension     Family History: Non contributory to the present illness  Social History: Social History   Socioeconomic History   Marital status: Legally Separated    Spouse name: Not on file   Number of children: Not on file   Years of education: Not on file   Highest education level: Not on file  Occupational History   Not on file  Tobacco Use   Smoking status: Every Day    Packs/day: 1.00    Types: Cigarettes    Last attempt to quit: 07/20/2016    Years since quitting: 5.8   Smokeless tobacco: Never  Vaping Use   Vaping Use: Never used  Substance and Sexual Activity   Alcohol use: No   Drug use: No   Sexual activity: Not on file  Other Topics Concern   Not on file  Social History Narrative   Not on file   Social Determinants of Health   Financial Resource Strain: Not on file  Food Insecurity: Not on file  Transportation Needs: Not on file  Physical Activity: Not on file  Stress: Not on file  Social Connections: Not on file  Intimate Partner Violence: Not on file    Vital Signs: Blood pressure 118/65, pulse 79, resp. rate 12, height 5\' 3"  (1.6 m), weight 188 lb 8 oz (85.5 kg), SpO2 97 %. Body mass index is 33.39 kg/m.    Examination: General Appearance: The patient is well-developed, well-nourished, and in no distress. Neck Circumference: 43cm Skin: Gross inspection of skin unremarkable. Head: normocephalic, no gross deformities. Eyes: no gross deformities noted. ENT: ears appear grossly normal Neurologic: Alert and oriented. No involuntary movements.    EPWORTH SLEEPINESS SCALE:  Scale:  (0)= no chance of dozing; (1)= slight chance of dozing; (2)= moderate chance of dozing; (3)= high chance of dozing  Chance  Situtation    Sitting and reading: 3    Watching TV: 3    Sitting  Inactive in public: 0    As a passenger in car: 2      Lying down to rest: 0    Sitting and talking: 0    Sitting quielty after lunch: 0    In a car, stopped in traffic: 0   TOTAL SCORE:   8 out of 24    SLEEP STUDIES:  PSG (02/2015)  AHI of 15; REM AHI 30; RERA 21; Low SpO2 low 84%   CPAP COMPLIANCE DATA:  Date Range: 05/15/21-05/14/22  Average Daily Use: 7 hours 42 min  Median Use: 7 hrs 51 min  Compliance for > 4 Hours: 95% days  AHI: 7.7 respiratory events per hour  Days Used: 358/365  Mask Leak: 11.7  95th Percentile Pressure: 9  LABS: Recent Results (from the past 2160 hour(s))  Resp Panel by RT-PCR (Flu A&B, Covid) Nasopharyngeal Swab     Status: None   Collection Time: 03/09/22  2:29 PM   Specimen: Nasopharyngeal Swab; Nasopharyngeal(NP) swabs in vial transport medium  Result Value Ref Range   SARS Coronavirus 2 by RT PCR NEGATIVE NEGATIVE    Comment: (NOTE) SARS-CoV-2 target nucleic acids are NOT DETECTED.  The SARS-CoV-2 RNA is generally detectable in upper respiratory specimens during the acute phase of infection. The lowest concentration of SARS-CoV-2 viral copies this assay can detect is 138 copies/mL. A negative result does not preclude SARS-Cov-2 infection and should not be used as the sole basis for treatment or other patient management decisions. A negative result Cieslik occur with  improper specimen collection/handling, submission of specimen other than nasopharyngeal swab, presence of viral mutation(s) within the areas targeted by this assay, and inadequate number of viral copies(<138 copies/mL). A negative result must be combined with clinical observations, patient history, and epidemiological information. The expected result is Negative.  Fact Sheet for Patients:  BloggerCourse.com  Fact Sheet for Healthcare Providers:  SeriousBroker.it  This test is no t yet approved or  cleared by the [place] FDA and  has been authorized for detection and/or diagnosis of SARS-CoV-2 by FDA under an Emergency Use Authorization (EUA). This EUA will remain  in effect (meaning this test can be used) for the duration of the COVID-19 declaration under Section 564(b)(1) of the Act, 21 U.S.C.section 360bbb-3(b)(1), unless the authorization is terminated  or revoked sooner.       Influenza A by PCR NEGATIVE NEGATIVE   Influenza B by PCR NEGATIVE NEGATIVE    Comment: (NOTE) The Xpert Xpress SARS-CoV-2/FLU/RSV plus assay is intended as an aid in the diagnosis of influenza from Nasopharyngeal swab specimens and should not be used as a sole basis for treatment. Nasal washings and aspirates are unacceptable for Xpert Xpress SARS-CoV-2/FLU/RSV testing.  Fact Sheet for Patients: BloggerCourse.com  Fact Sheet for Healthcare Providers: SeriousBroker.it  This test is not yet approved or cleared by the [place] FDA and has been authorized for detection and/or diagnosis of SARS-CoV-2 by FDA under an Emergency Use Authorization (EUA). This EUA will remain in effect (meaning this test can be used) for the duration of the COVID-19 declaration under Section 564(b)(1) of the Act, 21 U.S.C. section 360bbb-3(b)(1), unless the authorization is terminated or revoked.  Performed at [facility], [address], [place], [place] [address]     Radiology: DG Chest 2 View  Result Date: 03/09/2022 CLINICAL DATA:  Short of breath EXAM: CHEST - 2 VIEW COMPARISON:  07/31/2021 FINDINGS: Heart size and vascularity normal. Interval clearing of right middle lobe infiltrate. Lungs now clear without infiltrate effusion COPD with hyperinflation. IMPRESSION: No active cardiopulmonary disease. Electronically Signed   By: Marlan Palau M.D.   On: 03/09/2022 14:57    No results found.  No results found.    Assessment and  Plan: Patient Active Problem List   Diagnosis Date Noted   Primary hypertension 05/15/2022   CPAP use counseling 03/14/2021   Nausea and vomiting in adult 06/02/2019   Carpal tunnel syndrome of left wrist 06/07/2016   Restless leg syndrome 12/14/2015   Arthritis of knee 12/14/2015   Shingles 08/23/2015   OSA on CPAP 05/18/2015   Hyperlipidemia    Hypothyroid    Hypertension    Depression     1. OSA on CPAP The patient does tolerate  PAP and reports  benefit from PAP use. Her machine is at end of life and must be replaced. Her AHI is not optimally controlled but this has been only recently since she started taking tylenol PM. She does have a history of RLS. I have asked her to discontinue the tylenol PM. We will request a new machine, and if her AHI continues to be elevated we will switch to APAP. The patient was reminded how to clean equipment and advised to replace supplies routinely. The patient was also counselled on smoking cessation. The compliance is excellent. The AHI is 7.7.   OSA on cpap- CPAP continues to be medically necessary to treat this patient's OSA.  Needs machine replacement. F/u 30 days after set up.    2. CPAP use counseling CPAP Counseling: had a lengthy discussion with the patient regarding the importance of PAP therapy in management of the sleep apnea. Patient appears to understand the risk factor reduction and also understands the risks associated with untreated sleep apnea. Patient will try to make a good faith effort to remain compliant with therapy. Also instructed the patient on proper cleaning of the device including the water must be changed daily if possible and use of distilled water is preferred. Patient understands that the machine should be regularly cleaned with appropriate recommended cleaning solutions that do not damage the PAP machine for example given white vinegar and water rinses. Other methods such as ozone treatment Janosik not be as good as these  simple methods to achieve cleaning.   3. Restless leg syndrome Pt to stop tylenol PM to address this. She is on pregabalin.   4. Primary hypertension Hypertension Counseling:   The following hypertensive lifestyle modification were recommended and discussed:  1. Limiting alcohol intake to less than 1 oz/day of ethanol:(24 oz of beer or 8 oz of wine or 2 oz of 100-proof whiskey). 2. Take baby ASA 81 mg daily. 3. Importance of regular aerobic exercise and losing weight. 4. Reduce dietary saturated fat and cholesterol intake for overall cardiovascular health. 5. Maintaining adequate dietary potassium, calcium, and magnesium intake. 6. Regular monitoring of the blood pressure. 7. Reduce sodium intake to less than 100 mmol/day (less than 2.3 gm of sodium or less than 6 gm of sodium choride)        General Counseling: I have discussed the findings of the evaluation and examination with Sherri Peters.  I have also discussed any further diagnostic evaluation thatmay be needed or ordered today. Sherri Peters verbalizes understanding of the findings of todays visit. We also reviewed her medications today and discussed drug interactions and side effects including but not limited excessive drowsiness and altered mental states. We also discussed that there is always a risk not just to her but also people around her. she has been encouraged to call the office with any questions or concerns that should arise related to todays visit.  No orders of the defined types were placed in this encounter.       I have personally obtained a history, examined the patient, evaluated laboratory and imaging results, formulated the assessment and plan and placed orders. This patient was seen today by Emmaline Kluver, PA-C in collaboration with Dr. Freda Munro.   Yevonne Pax, MD [facility] Diplomate ABMS Pulmonary Critical Care Medicine and Sleep Medicine

## 2022-05-15 ENCOUNTER — Ambulatory Visit (INDEPENDENT_AMBULATORY_CARE_PROVIDER_SITE_OTHER): Payer: Medicare PPO | Admitting: Internal Medicine

## 2022-05-15 VITALS — BP 118/65 | HR 79 | Resp 12 | Ht 63.0 in | Wt 188.5 lb

## 2022-05-15 DIAGNOSIS — G4733 Obstructive sleep apnea (adult) (pediatric): Secondary | ICD-10-CM

## 2022-05-15 DIAGNOSIS — Z9989 Dependence on other enabling machines and devices: Secondary | ICD-10-CM

## 2022-05-15 DIAGNOSIS — G2581 Restless legs syndrome: Secondary | ICD-10-CM | POA: Diagnosis not present

## 2022-05-15 DIAGNOSIS — Z7189 Other specified counseling: Secondary | ICD-10-CM

## 2022-05-15 DIAGNOSIS — I10 Essential (primary) hypertension: Secondary | ICD-10-CM | POA: Diagnosis not present

## 2022-05-15 NOTE — Patient Instructions (Signed)
CPAP and BIPAP Information CPAP and BIPAP are methods that use air pressure to keep your airways open and to help you breathe well. CPAP and BIPAP use different amounts of pressure. Your health care provider will tell you whether CPAP or BIPAP would be more helpful for you. CPAP stands for "continuous positive airway pressure." With CPAP, the amount of pressure stays the same while you breathe in (inhale) and out (exhale). BIPAP stands for "bi-level positive airway pressure." With BIPAP, the amount of pressure will be higher when you inhale and lower when you exhale. This allows you to take larger breaths. CPAP or BIPAP Fernandez be used in the hospital, or your health care provider Conover want you to use it at home. You Ihrig need to have a sleep study before your health care provider can order a machine for you to use at home. What are the advantages? CPAP or BIPAP can be helpful if you have: Sleep apnea. Chronic obstructive pulmonary disease (COPD). Heart failure. Medical conditions that cause muscle weakness, including muscular dystrophy or amyotrophic lateral sclerosis (ALS). Other problems that cause breathing to be shallow, weak, abnormal, or difficult. CPAP and BIPAP are most commonly used for obstructive sleep apnea (OSA) to keep the airways from collapsing when the muscles relax during sleep. What are the risks? Generally, this is a safe treatment. However, problems Diggins occur, including: Irritated skin or skin sores if the mask does not fit properly. Dry or stuffy nose or nosebleeds. Dry mouth. Feeling gassy or bloated. Sinus or lung infection if the equipment is not cleaned properly. When should CPAP or BIPAP be used? In most cases, the mask only needs to be worn during sleep. Generally, the mask needs to be worn throughout the night and during any daytime naps. People with certain medical conditions Sieler also need to wear the mask at other times, such as when they are awake. Follow instructions  from your health care provider about when to use the machine. What happens during CPAP or BIPAP?  Both CPAP and BIPAP are provided by a small machine with a flexible plastic tube that attaches to a plastic mask that you wear. Air is blown through the mask into your nose or mouth. The amount of pressure that is used to blow the air can be adjusted on the machine. Your health care provider will set the pressure setting and help you find the best mask for you. Tips for using the mask Because the mask needs to be snug, some people feel trapped or closed-in (claustrophobic) when first using the mask. If you feel this way, you Mcginniss need to get used to the mask. One way to do this is to hold the mask loosely over your nose or mouth and then gradually apply the mask more snugly. You can also gradually increase the amount of time that you use the mask. Masks are available in various types and sizes. If your mask does not fit well, talk with your health care provider about getting a different one. Some common types of masks include: Full face masks, which fit over the mouth and nose. Nasal masks, which fit over the nose. Nasal pillow or prong masks, which fit into the nostrils. If you are using a mask that fits over your nose and you tend to breathe through your mouth, a chin strap Stanislawski be applied to help keep your mouth closed. Use a skin barrier to protect your skin as told by your health care provider. Some CPAP   and BIPAP machines have alarms that Rinehart sound if the mask comes off or develops a leak. If you have trouble with the mask, it is very important that you talk with your health care provider about finding a way to make the mask easier to tolerate. Do not stop using the mask. There could be a negative impact on your health if you stop using the mask. Tips for using the machine Place your CPAP or BIPAP machine on a secure table or stand near an electrical outlet. Know where the on/off switch is on the  machine. Follow instructions from your health care provider about how to set the pressure on your machine and when you should use it. Do not eat or drink while the CPAP or BIPAP machine is on. Food or fluids could get pushed into your lungs by the pressure of the CPAP or BIPAP. For home use, CPAP and BIPAP machines can be rented or purchased through home health care companies. Many different brands of machines are available. Renting a machine before purchasing Tygart help you find out which particular machine works well for you. Your health insurance company Labrie also decide which machine you Freedman get. Keep the CPAP or BIPAP machine and attachments clean. Ask your health care provider for specific instructions. Check the humidifier if you have a dry stuffy nose or nosebleeds. Make sure it is working correctly. Follow these instructions at home: Take over-the-counter and prescription medicines only as told by your health care provider. Ask if you can take sinus medicine if your sinuses are blocked. Do not use any products that contain nicotine or tobacco. These products include cigarettes, chewing tobacco, and vaping devices, such as e-cigarettes. If you need help quitting, ask your health care provider. Keep all follow-up visits. This is important. Contact a health care provider if: You have redness or pressure sores on your head, face, mouth, or nose from the mask or head gear. You have trouble using the CPAP or BIPAP machine. You cannot tolerate wearing the CPAP or BIPAP mask. Someone tells you that you snore even when wearing your CPAP or BIPAP. Get help right away if: You have trouble breathing. You feel confused. Summary CPAP and BIPAP are methods that use air pressure to keep your airways open and to help you breathe well. If you have trouble with the mask, it is very important that you talk with your health care provider about finding a way to make the mask easier to tolerate. Do not stop using  the mask. There could be a negative impact to your health if you stop using the mask. Follow instructions from your health care provider about when to use the machine. This information is not intended to replace advice given to you by your health care provider. Make sure you discuss any questions you have with your health care provider. Document Revised: 06/01/2021 Document Reviewed: 10/01/2020 Elsevier Patient Education  2023 [employer]  

## 2022-05-26 DIAGNOSIS — R7303 Prediabetes: Secondary | ICD-10-CM | POA: Insufficient documentation

## 2022-07-04 DIAGNOSIS — M545 Low back pain, unspecified: Secondary | ICD-10-CM | POA: Insufficient documentation

## 2022-07-04 DIAGNOSIS — M4316 Spondylolisthesis, lumbar region: Secondary | ICD-10-CM | POA: Insufficient documentation

## 2022-07-04 DIAGNOSIS — M1611 Unilateral primary osteoarthritis, right hip: Secondary | ICD-10-CM | POA: Insufficient documentation

## 2022-07-04 DIAGNOSIS — M533 Sacrococcygeal disorders, not elsewhere classified: Secondary | ICD-10-CM | POA: Insufficient documentation

## 2022-07-04 DIAGNOSIS — M5416 Radiculopathy, lumbar region: Secondary | ICD-10-CM | POA: Insufficient documentation

## 2022-07-04 DIAGNOSIS — M47816 Spondylosis without myelopathy or radiculopathy, lumbar region: Secondary | ICD-10-CM | POA: Insufficient documentation
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
## 2022-09-04 ENCOUNTER — Ambulatory Visit: Payer: Medicare PPO | Admitting: Internal Medicine

## 2022-12-02 DIAGNOSIS — M1712 Unilateral primary osteoarthritis, left knee: Secondary | ICD-10-CM | POA: Insufficient documentation

## 2022-12-04 ENCOUNTER — Ambulatory Visit: Payer: Medicare PPO | Admitting: Internal Medicine

## 2022-12-04 NOTE — Progress Notes (Signed)
Sleep Medicine   Office Visit  Patient Name: Sherri Peters DOB: 08/01/1963 MRN [id number]    Chief Complaint: ***  Brief History:  Sherri presents for initial sleep consult with a *** history of ***. Sleep quality is ***. This is noted *** nights. The patient's bed partner reports  *** at night. The patient relates the following symptoms: *** are also present. The patient goes to sleep at *** and wakes up at ***.   Sleep quality is *** when outside home environment.  Patient has noted *** of her legs at night.  The patient  relates *** behavior during the night.  The patient *** a history of psychiatric problems. The Epworth Sleepiness Score is *** out of 24 .  The patient relates  Cardiovascular risk factors include: *** The patient reports ***    ROS  General: (-) fever, (-) chills, (-) night sweat Nose and Sinuses: (-) nasal stuffiness or itchiness, (-) postnasal drip, (-) nosebleeds, (-) sinus trouble. Mouth and Throat: (-) sore throat, (-) hoarseness. Neck: (-) swollen glands, (-) enlarged thyroid, (-) neck pain. Respiratory: *** cough, *** shortness of breath, *** wheezing. Neurologic: *** numbness, *** tingling. Psychiatric: *** anxiety, *** depression Sleep behavior: ***sleep paralysis ***hypnogogic hallucinations ***dream enactment      ***vivid dreams ***cataplexy ***night terrors ***sleep walking   Current Medication: No outpatient encounter medications on file as of 12/28/2022.   No facility-administered encounter medications on file as of 12/28/2022.    Surgical History: *** The histories are not reviewed yet. Please review them in the "History" navigator section and refresh this SmartLink.  Medical History: No past medical history on file.  Family History: Non contributory to the present illness  Social History: Social History   Socioeconomic History   Marital status: Not on file    Spouse name: Not on file   Number of children: Not on file   Years of  education: Not on file   Highest education level: Not on file  Occupational History   Not on file  Tobacco Use   Smoking status: Not on file   Smokeless tobacco: Not on file  Substance and Sexual Activity   Alcohol use: Not on file   Drug use: Not on file   Sexual activity: Not on file  Other Topics Concern   Not on file  Social History Narrative   Not on file   Social Determinants of Health   Financial Resource Strain: Not on file  Food Insecurity: Not on file  Transportation Needs: Not on file  Physical Activity: Not on file  Stress: Not on file  Social Connections: Not on file  Intimate Partner Violence: Not on file    Vital Signs: There were no vitals taken for this visit. There is no height or weight on file to calculate BMI.   Examination: General Appearance: The patient is well-developed, well-nourished, and in no distress. Neck Circumference: *** Skin: Gross inspection of skin unremarkable. Head: normocephalic, no gross deformities. Eyes: no gross deformities noted. ENT: ears appear grossly normal Neurologic: Alert and oriented. No involuntary movements.    STOP BANG RISK ASSESSMENT S (snore) Have you been told that you snore?     YES/N   T (tired) Are you often tired, fatigued, or sleepy during the day?   YES/NO  O (obstruction) Do you stop breathing, choke, or gasp during sleep? YES/NO   P (pressure) Do you have or are you being treated for high blood pressure? YES/NO   B (

## 2022-12-16 DIAGNOSIS — M51369 Other intervertebral disc degeneration, lumbar region without mention of lumbar back pain or lower extremity pain: Secondary | ICD-10-CM | POA: Insufficient documentation

## 2022-12-16 DIAGNOSIS — M419 Scoliosis, unspecified: Secondary | ICD-10-CM | POA: Insufficient documentation

## 2022-12-16 DIAGNOSIS — M5136 Other intervertebral disc degeneration, lumbar region: Secondary | ICD-10-CM | POA: Insufficient documentation

## 2022-12-16 IMAGING — CR DG CHEST 2V
1 series · 2 of 2 positions shown · non-contrast
Comparison: May 31, 2019.

CLINICAL DATA: Cough, fever.

EXAM:
CHEST - 2 VIEW

[Series 1: dg chest 2 view · 0.14mm/px · 2 of 2 slices shown]
[im 1/2]
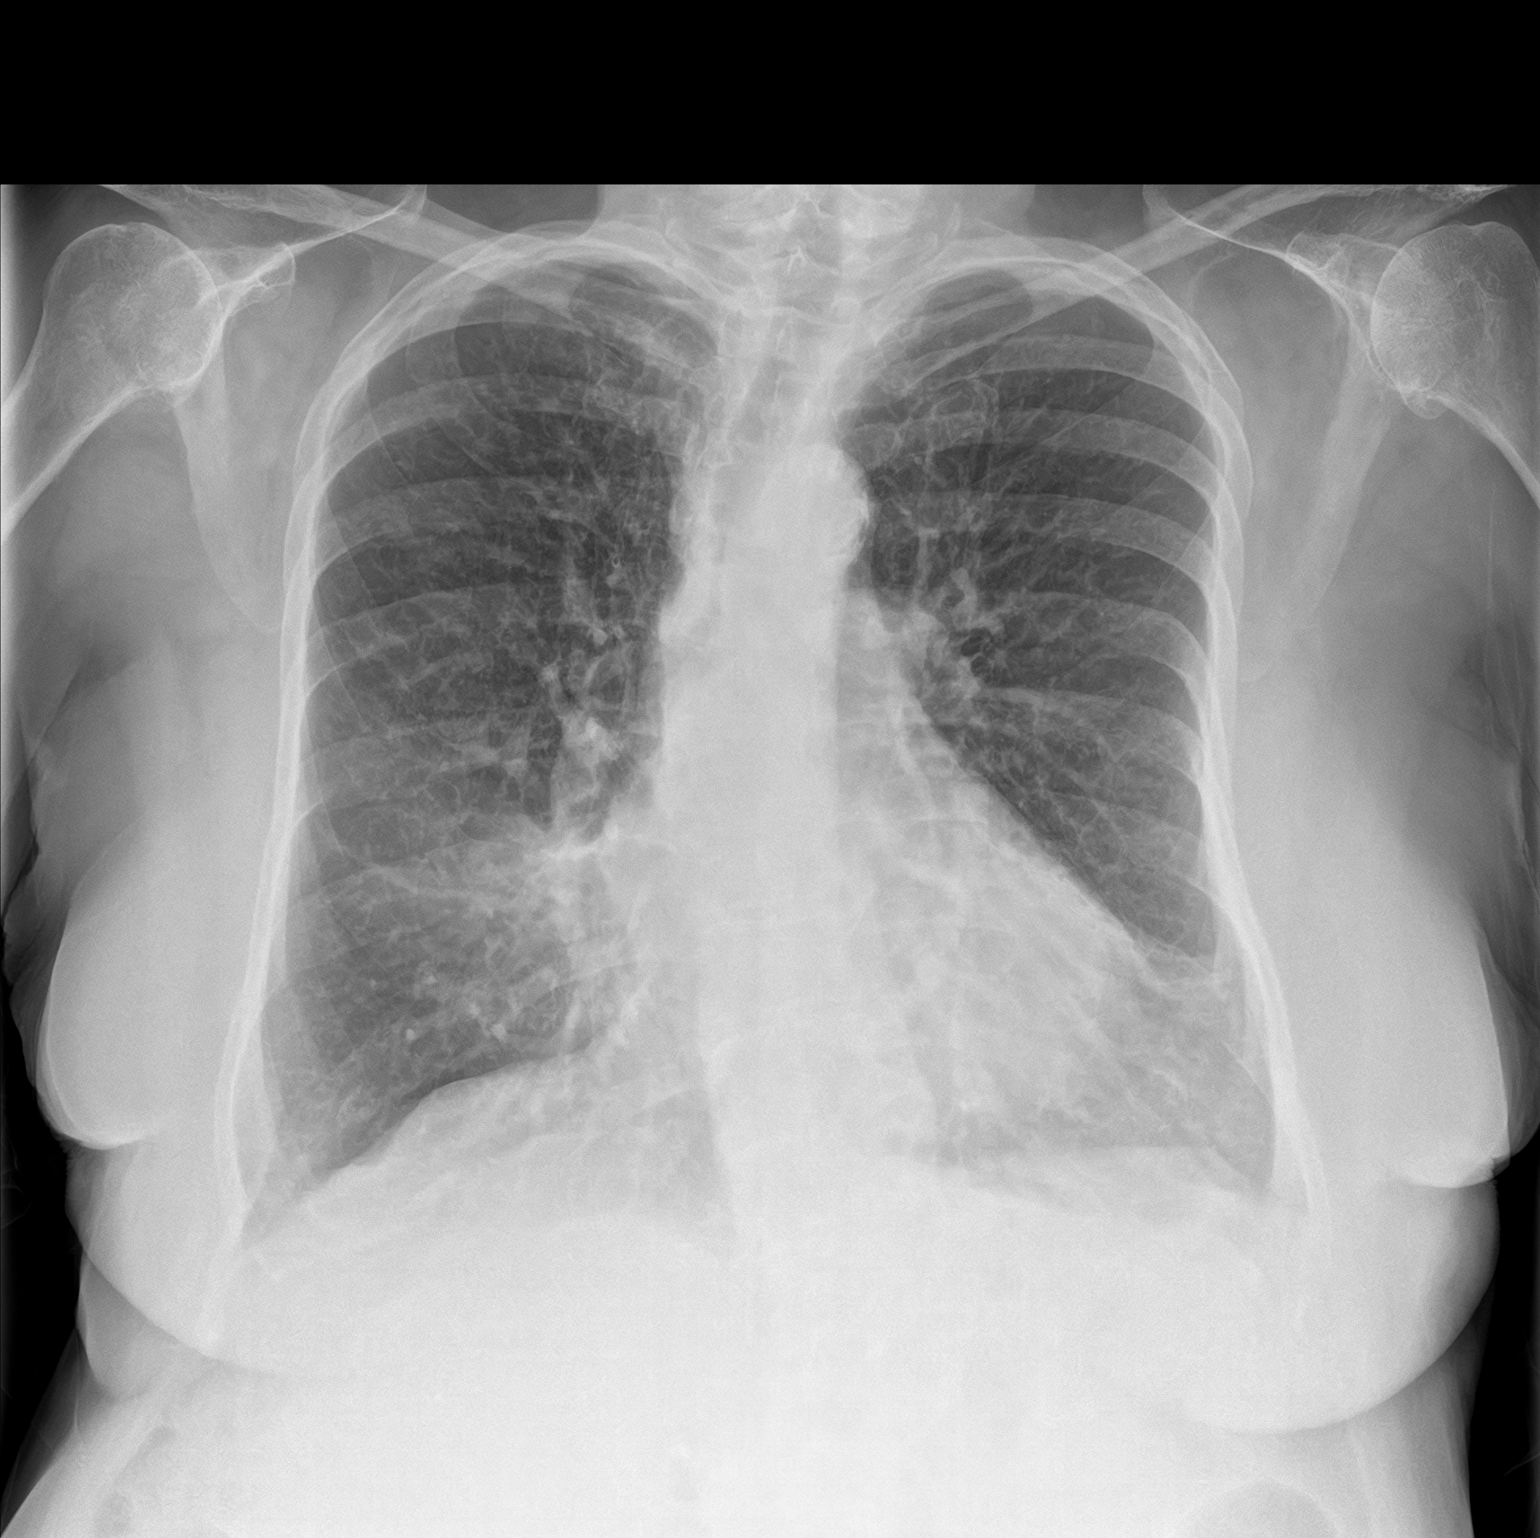
[im 2/2]
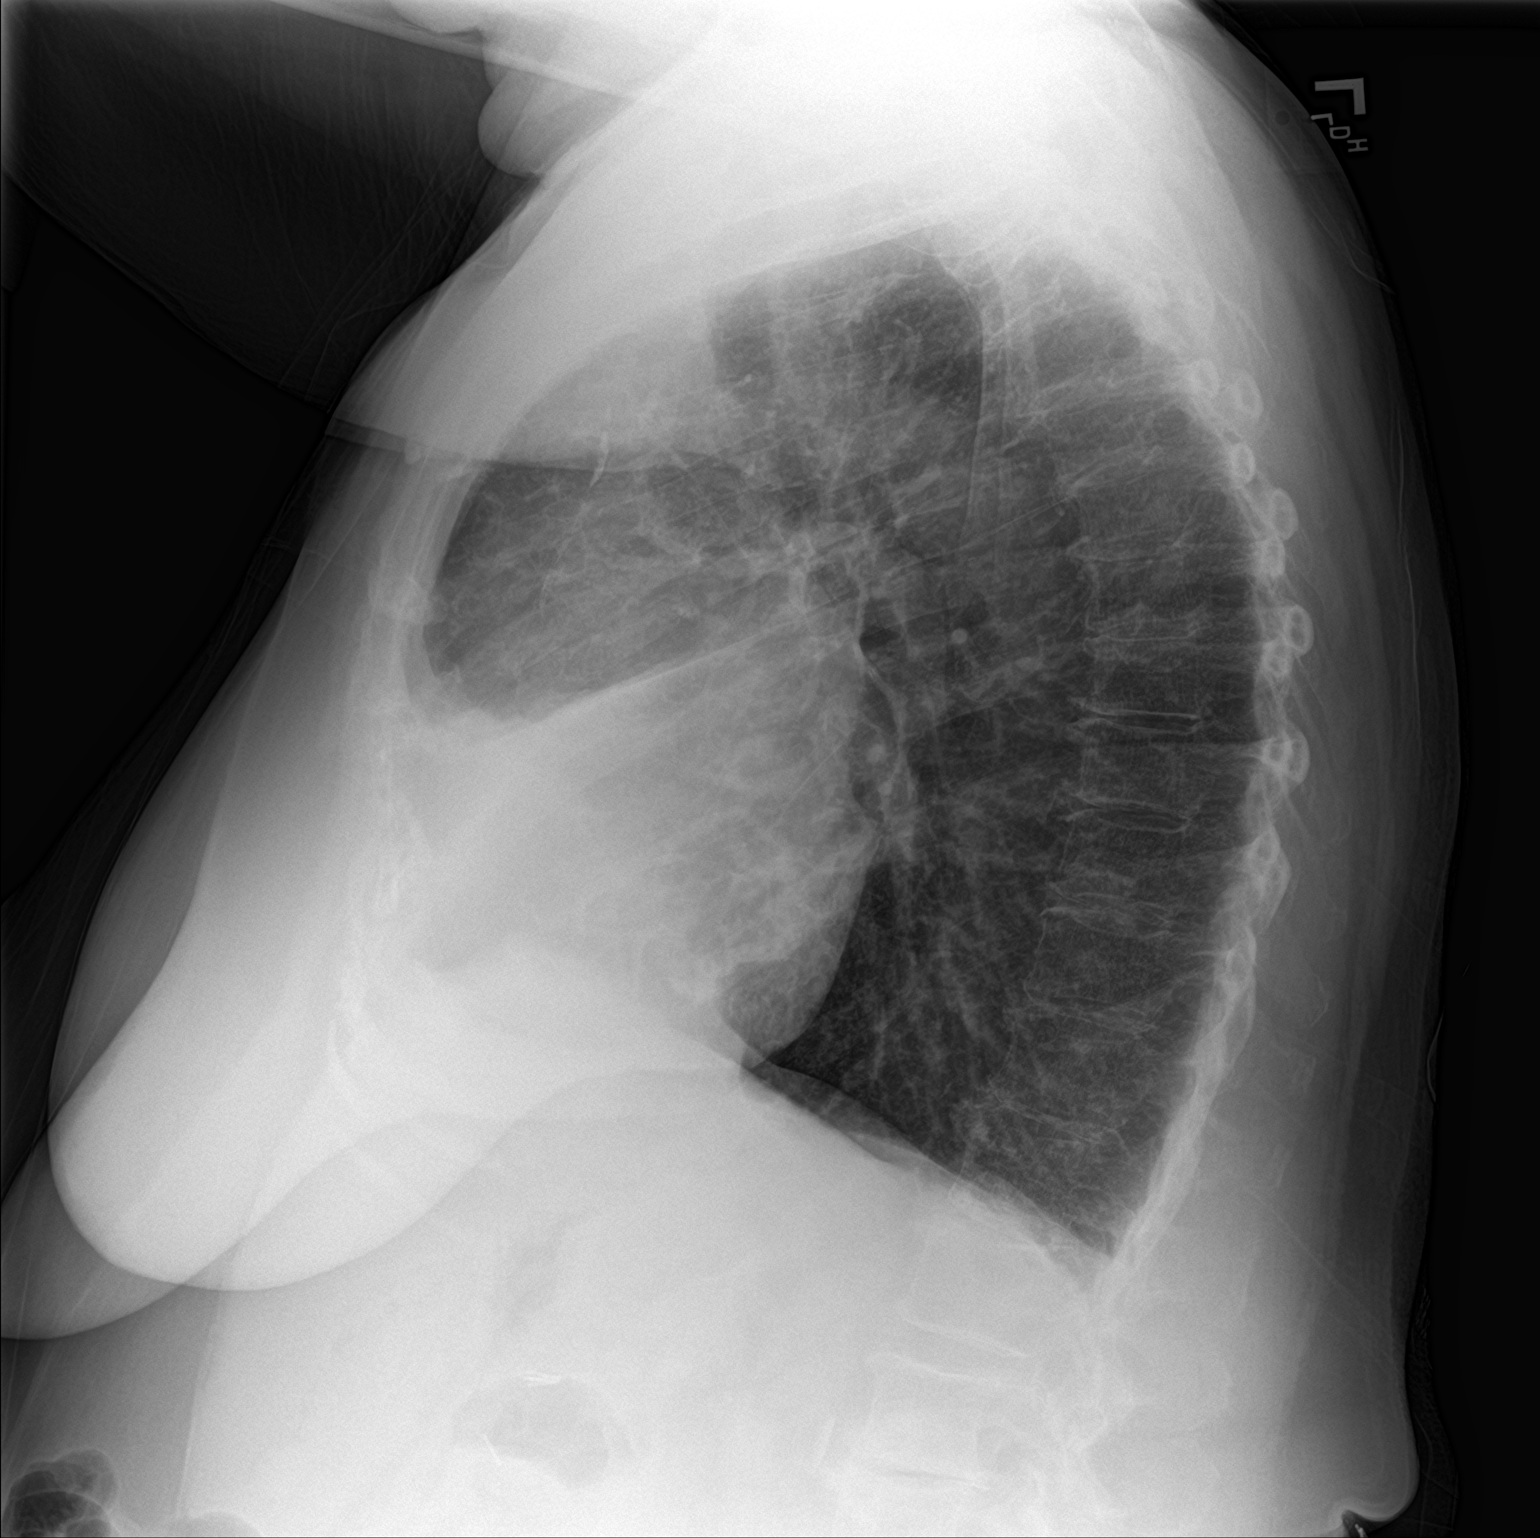

[2 of 2 positions shown; findings below may reference images not displayed]

FINDINGS: The heart size and mediastinal contours are within normal limits.
Interval development of right middle lobe opacity concerning for
pneumonia or atelectasis. The visualized skeletal structures are
unremarkable.
IMPRESSION: Right middle lobe pneumonia or atelectasis is noted. Followup PA and
lateral chest X-ray is recommended in 3-4 weeks following trial of
antibiotic therapy to ensure resolution and exclude underlying
malignancy.

## 2023-01-06 NOTE — Discharge Instructions (Signed)
Instructions after Total Knee Replacement   Wilfred Dayrit P. Donaldo Teegarden, Jr., M.D.     Dept. of Orthopaedics & Sports Medicine  [facility]  [address]  [place], [place]  [address]  Phone: [phone]   Fax: [phone]    DIET: Drink plenty of non-alcoholic fluids. Resume your normal diet. Include foods high in fiber.  ACTIVITY:  You Erion use crutches or a walker with weight-bearing as tolerated, unless instructed otherwise. You Lua be weaned off of the walker or crutches by your Physical Therapist.  Do NOT place pillows under the knee. Anything placed under the knee could limit your ability to straighten the knee.   Continue doing gentle exercises. Exercising will reduce the pain and swelling, increase motion, and prevent muscle weakness.   Please continue to use the TED compression stockings for 6 weeks. You Warriner remove the stockings at night, but should reapply them in the morning. Do not drive or operate any equipment until instructed.  WOUND CARE:  Continue to use the PolarCare or ice packs periodically to reduce pain and swelling. You Detert bathe or shower after the staples are removed at the first office visit following surgery.  MEDICATIONS: You Adcox resume your regular medications. Please take the pain medication as prescribed on the medication. Do not take pain medication on an empty stomach. You have been given a prescription for a blood thinner (Lovenox or Coumadin). Please take the medication as instructed. (NOTE: After completing a 2 week course of Lovenox, take one Enteric-coated aspirin once a day. This along with elevation will help reduce the possibility of phlebitis in your operated leg.) Do not drive or drink alcoholic beverages when taking pain medications.  CALL THE OFFICE FOR: Temperature above 101 degrees Excessive bleeding or drainage on the dressing. Excessive swelling, coldness, or paleness of the toes. Persistent nausea and vomiting.  FOLLOW-UP:  You  should have an appointment to return to the office in 10-14 days after surgery. Arrangements have been made for continuation of Physical Therapy (either home therapy or outpatient therapy).   [facility] Department Directory         [email or web address]       [email or web address]          Cardiology  Appointments: [place] - [phone] [place] - [phone]  Endocrinology  Appointments: [place] - [phone] [place] - [phone]  Gastroenterology  Appointments: [place] - [phone] [place] - [phone]        General Surgery   Appointments: [place] - [phone]  Internal Medicine/Family Medicine  Appointments: [place] - [phone] [place] - [phone] [place] - [phone]  Metabolic and Weigh Loss Surgery  Appointments: [place] - [phone]        Neurology  Appointments: [place] - [phone] [place] - [phone]  Neurosurgery  Appointments: [place] - [phone]  Obstetrics & Gynecology  Appointments: [place] - [phone] [place] - [phone]        Pediatrics  Appointments: [place] - [phone] [place] - [phone]  Physiatry  Appointments: [place] -[phone]  Physical Therapy  Appointments: [place] - [phone] [place] - [phone]        Podiatry  Appointments: [place] - [phone] [place] - [phone]  Pulmonology  Appointments: [place] - [phone]  Rheumatology  Appointments: [place] - [phone]         Location: [facility]  [address] , [place]  [address]  [place] Location: [facility] [address] [place], [place]  [address]  [place] Location: [facility] [address] [place], [place]  [address]    

## 2023-01-08 ENCOUNTER — Other Ambulatory Visit: Payer: Self-pay

## 2023-01-08 ENCOUNTER — Encounter
Admission: RE | Admit: 2023-01-08 | Discharge: 2023-01-08 | Disposition: A | Discharge status: Home / Self Care | Payer: Medicare PPO | Source: Ambulatory Visit | Attending: Orthopedic Surgery | Admitting: Orthopedic Surgery

## 2023-01-08 ENCOUNTER — Encounter: Payer: Self-pay | Admitting: Orthopedic Surgery

## 2023-01-08 VITALS — BP 141/66 | HR 91 | Resp 18 | Ht 63.0 in | Wt 183.0 lb

## 2023-01-08 DIAGNOSIS — Z01818 Encounter for other preprocedural examination: Secondary | ICD-10-CM | POA: Diagnosis present

## 2023-01-08 DIAGNOSIS — R7303 Prediabetes: Secondary | ICD-10-CM | POA: Diagnosis not present

## 2023-01-08 DIAGNOSIS — Z01812 Encounter for preprocedural laboratory examination: Secondary | ICD-10-CM

## 2023-01-08 DIAGNOSIS — M1712 Unilateral primary osteoarthritis, left knee: Secondary | ICD-10-CM | POA: Insufficient documentation

## 2023-01-08 DIAGNOSIS — R9431 Abnormal electrocardiogram [ECG] [EKG]: Secondary | ICD-10-CM | POA: Diagnosis not present

## 2023-01-08 HISTORY — DX: Hypothyroidism, unspecified: E03.9

## 2023-01-08 HISTORY — DX: Atherosclerotic heart disease of native coronary artery without angina pectoris: I25.10

## 2023-01-08 HISTORY — DX: Prediabetes: R73.03

## 2023-01-08 HISTORY — DX: Chronic obstructive pulmonary disease, unspecified: J44.9

## 2023-01-08 LAB — COMPREHENSIVE METABOLIC PANEL
ALT: 18 U/L (ref 0–44)
AST: 21 U/L (ref 15–41)
Albumin: 3.8 g/dL (ref 3.5–5.0)
Alkaline Phosphatase: 72 U/L (ref 38–126)
Anion gap: 7 (ref 5–15)
BUN: 27 mg/dL — ABNORMAL HIGH (ref 8–23)
CO2: 25 mmol/L (ref 22–32)
Calcium: 9.5 mg/dL (ref 8.9–10.3)
Chloride: 106 mmol/L (ref 98–111)
Creatinine, Ser: 1.04 mg/dL — ABNORMAL HIGH (ref 0.44–1.00)
GFR, Estimated: 56 mL/min — ABNORMAL LOW (ref >60)
Glucose, Bld: 99 mg/dL (ref 70–99)
Potassium: 3.7 mmol/L (ref 3.5–5.1)
Sodium: 138 mmol/L (ref 135–145)
Total Bilirubin: 0.3 mg/dL (ref 0.3–1.2)
Total Protein: 6.2 g/dL — ABNORMAL LOW (ref 6.5–8.1)

## 2023-01-08 LAB — CBC
HCT: 38.4 % (ref 36.0–46.0)
Hemoglobin: 12 g/dL (ref 12.0–15.0)
MCH: 30 pg (ref 26.0–34.0)
MCHC: 31.3 g/dL (ref 30.0–36.0)
MCV: 96 fL (ref 80.0–100.0)
Platelets: 265 10*3/uL (ref 150–400)
RBC: 4 MIL/uL (ref 3.87–5.11)
RDW: 14.7 % (ref 11.5–15.5)
WBC: 9.1 10*3/uL (ref 4.0–10.5)
nRBC: 0 % (ref 0.0–0.2)

## 2023-01-08 LAB — URINALYSIS, ROUTINE W REFLEX MICROSCOPIC
Bacteria, UA: NONE SEEN
Bilirubin Urine: NEGATIVE
Glucose, UA: NEGATIVE mg/dL
Hgb urine dipstick: NEGATIVE
Ketones, ur: NEGATIVE mg/dL
Nitrite: NEGATIVE
Protein, ur: NEGATIVE mg/dL
Specific Gravity, Urine: 1.02 (ref 1.005–1.030)
pH: 5 (ref 5.0–8.0)

## 2023-01-08 LAB — SURGICAL PCR SCREEN
MRSA, PCR: NEGATIVE
Staphylococcus aureus: NEGATIVE

## 2023-01-08 LAB — TYPE AND SCREEN
ABO/RH(D): O POS
Antibody Screen: NEGATIVE

## 2023-01-08 LAB — C-REACTIVE PROTEIN: CRP: 1.7 mg/dL — ABNORMAL HIGH (ref <1.0)

## 2023-01-08 LAB — SEDIMENTATION RATE: Sed Rate: 12 mm/hr (ref 0–30)

## 2023-01-08 NOTE — Patient Instructions (Addendum)
Your procedure is scheduled on: Wednesday January 17, 2023. Report to the Registration Desk on the 1st floor of the [facility]. To find out your arrival time, please call [phone] between 1PM - 3PM on: Tuesday January 16, 2023. If your arrival time is 6:00 am, do not arrive before that time as the [facility] entrance doors do not open until 6:00 am.  REMEMBER: Instructions that are not followed completely Mcbrearty result in serious medical risk, up to and including death; or upon the discretion of your surgeon and anesthesiologist your surgery Sh[place] need to be rescheduled.  Do not eat food after midnight the night before surgery.  No gum chewing or hard candies.  You Fromme however, drink CLEAR liquids up to 2 hours before you are scheduled to arrive for your surgery. Do not drink anything within 2 hours of your scheduled arrival time.  Clear liquids include: - water  Do NOT drink anything that is not on this list.  Type 1 and Type 2 diabetics should only drink water.  In addition, your doctor has ordered for you to drink the provided:  Gatorade G2 Drinking this carbohydrate drink up to two hours before surgery helps to reduce insulin resistance and improve patient outcomes. Please complete drinking 2 hours before scheduled arrival time.  One week prior to surgery: Stop Anti-inflammatories (NSAIDS) such as Advil, Aleve, Ibuprofen, Motrin, Naproxen, Naprosyn and Aspirin based products such as Excedrin, Goody's Powder, BC Powder. Stop ANY OVER THE COUNTER supplements until after surgery. You Albers however, continue to take Tylenol if needed for pain up until the day of surgery.  Continue taking all prescribed medications with the exception of the following: Stop Metformin 2 days prior to surgery. (Take last dose Sunday 01/14/23)   Follow recommendations from Cardiologist or PCP regarding stopping blood thinners.  TAKE ONLY THESE MEDICATIONS THE MORNING OF SURGERY WITH A SIP OF  WATER:  buPROPion (WELLBUTRIN XL) 150 MG  busPIRone (BUSPAR) 5 MG  levothyroxine (SYNTHROID) 75 MCG  pregabalin (LYRICA) 100 MG omeprazole (PRILOSEC) 20 MG Antacid (take one the night before and one on the morning of surgery - helps to prevent nausea after surgery.)  Use inhalers on the day of surgery and bring to the hospital   No Alcohol for 24 hours before or after surgery.  No Smoking including e-cigarettes for 24 hours before surgery.  No chewable tobacco products for at least 6 hours before surgery.  No nicotine patches on the day of surgery.  Do not use any "recreational" drugs for at least a week (preferably 2 weeks) before your surgery.  Please be advised that the combination of cocaine and anesthesia [place] have negative outcomes, up to and including death. If you test positive for cocaine, your surgery will be cancelled.  On the morning of surgery brush your teeth with toothpaste and water, you [place]umb[place] rinse your mouth with mouthwash if you wish. Do not swallow any toothpaste or mouthwash.  Use CHG Soap or wipes as directed on instruction sheet.  Do not wear jewelry, make-up, hairpins, clips or nail polish.  Do not wear lotions, powders, or perfumes.   Do not shave body hair from the neck down 48 hours before surgery.  Contact lenses, hearing aids and dentures Brostrom not be worn into surgery.  Do not bring valuables to the hospital. [facility] is not responsible for any missing/lost belongings or valuables.   Notify your doctor if there is any change in your medical condition (cold, fever,  infection).  Wear comfortable clothing (specific to your surgery type) to the hospital.  After surgery, you can help prevent lung complications by doing breathing exercises.  Take deep breaths and cough every 1-2 hours. Your doctor Tallent order a device called an Incentive Spirometer to help you take deep breaths. When coughing or sneezing, hold a pillow firmly against your incision with  both hands. This is called "splinting." Doing this helps protect your incision. It also decreases belly discomfort.  If you are being admitted to the hospital overnight, leave your suitcase in the car. After surgery it [place] be brought to your room.  In case of increased patient census, it F[place]co be necessary for you, the patient, to continue your postoperative care in the Same Day Surgery department.  If you are being discharged the day of surgery, you will not be allowed to drive home. You will need a responsible individual to drive you home and stay with you for 24 hours after surgery.   If you are taking public transportation, you will need to have a responsible individual with you.  Please call the [facility] at [phone] if you have any questions about these instructions.  Surgery Visitation Policy:  Patients undergoing a surgery or procedure Shavers have two family members or support persons with them as long as the person is not COVID-19 positive or experiencing its symptoms.   Inpatient Visitation:    Visiting hours are 7 a.m. to 8 p.m. Up to four visitors are allowed at one time in a patient room. The visitors Nance rotate out with other people during the day. One designated support person (adult) Abernethy remain overnight.  Due to an increase in RSV and influenza rates and associated hospitalizations, children ages 75 and under will not be able to visit patients in [facility]. Masks continue to be strongly recommended.     Preparing for Surgery with CHLORHEXIDINE GLUCONATE (CHG) Soap  Chlorhexidine Gluconate (CHG) Soap  o An antiseptic cleaner that kills germs and bonds with the skin to continue killing germs even after washing  o Used for showering the night before surgery and morning of surgery  Before surgery, you can play an important role by reducing the number of germs on your skin.  CHG (Chlorhexidine gluconate) soap is an antiseptic cleanser  which kills germs and bonds with the skin to continue killing germs even after washing.  Please do not use if you have an allergy to CHG or antibacterial soaps. If your skin becomes reddened/irritated stop using the CHG.  1. Shower the NIGHT BEFORE SURGERY and the MORNING OF SURGERY with CHG soap.  2. If you choose to wash your hair, wash your hair first as usual with your normal shampoo.  3. After shampooing, rinse your hair and body thoroughly to remove the shampoo.  4. Use CHG as you would any other liquid soap. You can apply CHG directly to the skin and wash gently with a scrungie or a clean washcloth.  5. Apply the CHG soap to your body only from the neck down. Do not use on open wounds or open sores. Avoid contact with your eyes, ears, mouth, and genitals (private parts). Wash face and genitals (private parts) with your normal soap.  6. Wash thoroughly, paying special attention to the area where your surgery will be performed.  7. Thoroughly rinse your body with warm water.  8. Do not shower/wash with your normal soap after using and rinsing off the CHG  soap.  9. Pat yourself dry with a clean towel.  10. Wear clean pajamas to bed the night before surgery.  12. Place clean sheets on your bed the night of your first shower and do not sleep with pets.  13. Shower again with the CHG soap on the day of surgery prior to arriving at the hospital.  14. Do not apply any deodorants/lotions/powders.  15. Please wear clean clothes to the hospital.How to Use an Incentive Spirometer  An incentive spirometer is a tool that measures how well you are filling your lungs with each breath. Learning to take long, deep breaths using this tool can help you keep your lungs clear and active. This Sholl help to reverse or lessen your chance of developing breathing (pulmonary) problems, especially infection. You Goodgame be asked to use a spirometer: After a surgery. If you have a lung problem or a history of  smoking. After a long period of time when you have been unable to move or be active. If the spirometer includes an indicator to show the highest number that you have reached, your health care provider or respiratory therapist will help you set a goal. Keep a log of your progress as told by your health care provider. What are the risks? Breathing too quickly Mandrell cause dizziness or cause you to pass out. Take your time so you do not get dizzy or light-headed. If you are in pain, you Noone need to take pain medicine before doing incentive spirometry. It is harder to take a deep breath if you are having pain. How to use your incentive spirometer  Sit up on the edge of your bed or on a chair. Hold the incentive spirometer so that it is in an upright position. Before you use the spirometer, breathe out normally. Place the mouthpiece in your mouth. Make sure your lips are closed tightly around it. Breathe in slowly and as deeply as you can through your mouth, causing the piston or the ball to rise toward the top of the chamber. Hold your breath for 3-5 seconds, or for as long as possible. If the spirometer includes a coach indicator, use this to guide you in breathing. Slow down your breathing if the indicator goes above the marked areas. Remove the mouthpiece from your mouth and breathe out normally. The piston or ball will return to the bottom of the chamber. Rest for a few seconds, then repeat the steps 10 or more times. Take your time and take a few normal breaths between deep breaths so that you do not get dizzy or light-headed. Do this every 1-2 hours when you are awake. If the spirometer includes a goal marker to show the highest number you have reached (best effort), use this as a goal to work toward during each repetition. After each set of 10 deep breaths, cough a few times. This will help to make sure that your lungs are clear. If you have an incision on your chest or abdomen from surgery,  place a pillow or a rolled-up towel firmly against the incision when you cough. This can help to reduce pain while taking deep breaths and coughing. General tips When you are able to get out of bed: Walk around often. Continue to take deep breaths and cough in order to clear your lungs. Keep using the incentive spirometer until your health care provider says it is okay to stop using it. If you have been in the hospital, you Claros be told to keep  using the spirometer at home. Contact a health care provider if: You are having difficulty using the spirometer. You have trouble using the spirometer as often as instructed. Your pain medicine is not giving enough relief for you to use the spirometer as told. You have a fever. Get help right away if: You develop shortness of breath. You develop a cough with bloody mucus from the lungs. You have fluid or blood coming from an incision site after you cough. Summary An incentive spirometer is a tool that can help you learn to take long, deep breaths to keep your lungs clear and active. You Badalamenti be asked to use a spirometer after a surgery, if you have a lung problem or a history of smoking, or if you have been inactive for a long period of time. Use your incentive spirometer as instructed every 1-2 hours while you are awake. If you have an incision on your chest or abdomen, place a pillow or a rolled-up towel firmly against your incision when you cough. This will help to reduce pain. Get help right away if you have shortness of breath, you cough up bloody mucus, or blood comes from your incision when you cough. This information is not intended to replace advice given to you by your health care provider. Make sure you discuss any questions you have with your health care provider. Document Revised: 01/12/2020 Document Reviewed: 01/12/2020 Elsevier Patient Education  [place].   Please go to the following website to access important education  materials concerning your upcoming joint replacement.                                   [email or web address]

## 2023-01-09 LAB — HEMOGLOBIN A1C
Hgb A1c MFr Bld: 5.8 % — ABNORMAL HIGH (ref 4.8–5.6)
Mean Plasma Glucose: 120 mg/dL

## 2023-01-12 NOTE — Progress Notes (Signed)
Perioperative / Anesthesia Services  Pre-Admission Testing Clinical Review / Preoperative Anesthesia Consult  Date: 01/14/23  Patient Demographics:  Name: Sherri Peters DOB:   04-07-1946 MRN:   [id number]  Planned Surgical Procedure(s):    Case: [id number] Date/Time: 01/17/23 1056   Procedure: COMPUTER ASSISTED TOTAL KNEE ARTHROPLASTY - RNFA (Left: Knee)   Anesthesia type: Choice   Pre-op diagnosis: PRIMARY OSTEOARTHRITIS OF LEFT KNEE.   Location: [facility] OR ROOM 01 / [place] [facility] FOR ANESTHES[facility]   Surgeons: Dereck Leep, MD     NOTE: Available PAT nursing documentation and vital signs have been reviewed. Clinical nursing staff has updated patient's PMH/PSHx, current medication list, and drug allergies/intolerances to ensure comprehensive history available to assist in medical decision making as it pertains to the aforementioned surgical procedure and anticipated anesthetic course. Extensive review of available clinical information personally performed. [place] PMH and PSHx updated with any diagnoses/procedures that  [name] have been inadvertently omitted during her intake with the pre-admission testing department's nursing staff.  Clinical Discussion:  Sherri Peters is a 77 y.o. female who is submitted for pre-surgical anesthesia review and clearance prior to her undergoing the above procedure. Patient is a Current Smoker. Pertinent PMH includes: CAD, diastolic dysfunction, BILATERAL carotid artery disease, aortic atherosclerosis, HTN, HLD, prediabetes, hypothyroidism, CKD-II, COPD, OSAH (requires nocturnal PAP therapy), GERD (on daily PPI), OA, fibromyalgia, depression.  Patient is followed by cardiology Nehemiah Massed, MD). She was last seen in the cardiology clinic on 06/19/2022; notes reviewed. At the time of her clinic visit, patient doing well overall from a cardiovascular perspective. Patient denied any chest pain, shortness of breath, PND, orthopnea, palpitations, significant peripheral edema,  weakness, fatigue, vertiginous symptoms, or presyncope/syncope. Patient with a past medical history significant for cardiovascular diagnoses. Documented physical exam was grossly benign, providing no evidence of acute exacerbation and/or decompensation of the patient's known cardiovascular conditions.  Most recent myocardial perfusion imaging student performed on 09/12/2021 revealed a normal left ventricular systolic function with a hyperdynamic LVEF of 71%. There were no regional wall motional abnormalities. No evidence of stress induced myocardial ischemia or arrhythmia noted; no scintigraphic evidence of scare. Study determined to be normal and low risk.   Most recent TTE was performed on 09/12/2021 revealing a normal left ventricular systolic function with an LVEF of >55%. There were no regional wall motion abnormalities. Left ventricular diastolic Doppler parameters consistent with abnormal relaxation (G1DD). There was mild mitral annular calcification. Trivial tricuspid and pulmonary, in addition to moderate mitral valve regurgitation was observed. All transvalvular gradients were noted to be normal providing no evidence suggestive of valvular stenosis.   Blood pressure loosely controlled at 150/82 mmHg on currently prescribed ARB (telmisartan) and diuretic (HCTZ)  therapies.  Patient is on atorvastatin for her HLD diagnosis and ASCVD prevention. Patient has a prediabetes diagnosis; last HgbA1c was 6.1% when last seen by cardiology. Of note, value has been rechecked since that time with improvement to 5.8% on 01/08/2023. She does have an OSAH diagnosis and is reported to be compliant with prescribed nocturnal PAP therapy. Functional capacity, as defined by DASI, is documented as being >/= 4 METS. No changes were made to her medication regimen during her visit with cardiology.  Patient scheduled to follow-up with outpatient cardiology in 9 months or sooner if needed.  Sherri Peters is scheduled for an  elective COMPUTER ASSISTED LEFT TOTAL KNEE ARTHROPLASTY on 01/17/2023 with Dr. Skip Estimable, MD.  Given patient's past medical history significant for cardiovascular diagnoses, presurgical cardiac clearance  was sought by the PAT team. Per cardiology, "this patient is optimized for surgery and Puff proceed with the planned procedural course with a LOW risk of significant perioperative cardiovascular complications".    In review of her medication reconciliation, it is noted that patient is currently on prescribed daily antiplatelet therapy. She has been instructed on recommendations for holding her daily low-dose ASA for 3 days prior to her procedure with plans to restart as soon as postoperative bleeding risk felt to be minimized by her attending surgeon. The patient has been instructed that her last dose of her ASA should be on 01/13/2023.  Patient denies previous perioperative complications with anesthesia in the past. In review of the available records, it is noted that patient underwent a MAC anesthetic course at [facility] (ASA III) in 05/2020 without documented complications.      01/08/2023    8:17 AM 05/15/2022    3:09 PM 03/09/2022    2:28 PM  Vitals with BMI  Height '5\' 3"'$  '5\' 3"'$    Weight 183 lbs 188 lbs 8 oz   BMI [id number]   Systolic [id number] [id number]  Diastolic 66 65 79  Pulse 91 79 82    Providers/Specialists:   NOTE: Primary physician provider listed below. Patient [name] have been seen by APP or partner within same practice.   PROVIDER ROLE / SPECIALTY LAST OV  Hooten, Laurice Record, MD Orthopedics (Surgeon) 01/10/2023  Barbaraann Boys, MD Primary Care Provider 10/06/2022  Serafina Royals, MD Cardiology 06/19/2022   Allergies:  Duloxetine, Levaquin [levofloxacin], Gabapentin, and Meloxicam  Current Home Medications:   No current facility-administered medications for this encounter.    acetaminophen (TYLENOL) 650 MG CR tablet   albuterol (VENTOLIN HFA) 108 (90 Base)  MCG/ACT inhaler   Ascorbic Acid (VITAMIN C) 500 MG CAPS   aspirin EC 81 MG tablet   atorvastatin (LIPITOR) 40 MG tablet   azelastine (ASTELIN) 0.1 % nasal spray   b complex vitamins capsule   B Complex-C (B-COMPLEX WITH VITAMIN C) tablet   buPROPion (WELLBUTRIN XL) 150 MG 24 hr tablet   busPIRone (BUSPAR) 5 MG tablet   Cholecalciferol (VITAMIN D3) 50 MCG (2000 UT) capsule   citalopram (CELEXA) 40 MG tablet   levothyroxine (SYNTHROID) 75 MCG tablet   levothyroxine (SYNTHROID, LEVOTHROID) 75 MCG tablet   metFORMIN (GLUCOPHAGE) 500 MG tablet   omeprazole (PRILOSEC) 20 MG capsule   omeprazole (PRILOSEC) 20 MG capsule   ondansetron (ZOFRAN ODT) 4 MG disintegrating tablet   ondansetron (ZOFRAN) 4 MG tablet   pregabalin (LYRICA) 100 MG capsule   pregabalin (LYRICA) 100 MG capsule   telmisartan-hydrochlorothiazide (MICARDIS HCT) 80-12.5 MG tablet   triamcinolone cream (KENALOG) 0.1 %   Vitamin D, Ergocalciferol, 50 MCG (2000 UT) CAPS   History:   Past Medical History:  Diagnosis Date   Aortic atherosclerosis (HCC)    Arthritis    Bilateral carotid artery disease (HCC)    Carpal tunnel syndrome of left wrist    CKD (chronic kidney disease), stage II    COPD (chronic obstructive pulmonary disease) (HCC)    Coronary artery disease    Depression    Diastolic dysfunction    a.) TTE 11/15/2018: EF >55%, mild LVH, MAC, BAE, triv PR, mild MR/TR, G1DD; b.) TTE 09/12/2021: EF >55%, mild LVH, MAC, triv TR/PR, mod MR, G1DD   Endophthalmitis, right    Fibromyalgia    GERD (gastroesophageal reflux disease)    History of bilateral cataract extraction 06/2018  Hyperlipidemia    Hypertension    Hypothyroidism    OSA on CPAP    Pre-diabetes    RLS (restless legs syndrome)    Statin intolerance    Umbilical hernia    a.) s/p repair 11/10/2016   Past Surgical History:  Procedure Laterality Date   ABDOMINAL HYSTERECTOMY     APPENDECTOMY N/A    CATARACT EXTRACTION Bilateral 06/2018    CHOLECYSTECTOMY N/A 12/08/2019   COLONOSCOPY     TONSILLECTOMY Bilateral    TOTAL KNEE ARTHROPLASTY Right 06/03/2020   ARTHROPLASTY, KNEE, CONDYLE AND PLATEAU; MEDIAL AND LATERAL COMPARTMENTSWITH OR WITHOUT PATELLA RESURFACING (TOTAL KNEE ARTHROPLASTY); Surgeon: Mardi Mainland, MD; Location: [place]; Service: Orthopedics; Laterality: Right;   UMBILICAL HERNIA REPAIR  11/10/2016   LAPAROSCOPY, SURGICAL; REPAIR, UMBILICAL HERNIA (INCLUDES MESH INSERTION, WHEN PERFORMED); REDUCIBLE - laparoscopic umbilical hernia repair. GETA; Surgeon: Nida Boatman, MD; Location: [place]; Service: General Surgery; Laterality: N/A   Family History  Problem Relation Age of Onset   Heart disease Mother    Hypertension Mother    AAA (abdominal aortic aneurysm) Mother    Stroke Father    Heart disease Father    Hypertension Father    Aneurysm Sister    Social History   Tobacco Use   Smoking status: Every Day    Packs/day: 1.00    Types: Cigarettes    Last attempt to quit: 07/20/2016    Years since quitting: 6.4   Smokeless tobacco: Never  Vaping Use   Vaping Use: Never used  Substance Use Topics   Alcohol use: No   Drug use: No    Pertinent Clinical Results:  LABS:   No visits with results within 3 Day(s) from this visit.  Latest known visit with results is:  Hospital Outpatient Visit on 01/08/2023  Component Date Value Ref Range Status   ABO/RH(D) 01/08/2023 O POS   Final   Antibody Screen 01/08/2023 NEG   Final   Sample Expiration 01/08/2023 01/22/2023,2359   Final   Extend sample reason 01/08/2023    Final                   Value:NO TRANSFUSIONS OR PREGNANCY IN THE PAST 3 MONTHS Performed at [facility], [address] [place], [place] [address]    MRSA, PCR 01/08/2023 NEGATIVE  NEGATIVE Final   Staphylococcus aureus 01/08/2023 NEGATIVE  NEGATIVE Final   Comment: (NOTE) The Xpert SA Assay (FDA approved for NASAL specimens in patients  46 years of age and older), is one component of a comprehensive surveillance program. It is not intended to diagnose infection nor to guide or monitor treatment. Performed at [facility], [address] [place], [place] [address]    WBC 01/08/2023 9.1  4.0 - 10.5 K/uL Final   RBC 01/08/2023 4.00  3.87 - 5.11 MIL/uL Final   Hemoglobin 01/08/2023 12.0  12.0 - 15.0 g/dL Final   HCT 01/08/2023 38.4  36.0 - 46.0 % Final   MCV 01/08/2023 96.0  80.0 - 100.0 fL Final   MCH 01/08/2023 30.0  26.0 - 34.0 pg Final   MCHC 01/08/2023 31.3  30.0 - 36.0 g/dL Final   RDW 01/08/2023 14.7  11.5 - 15.5 % Final   Platelets 01/08/2023 265  150 - 400 K/uL Final   nRBC 01/08/2023 0.0  0.0 - 0.2 % Final   Performed at [facility], [address] [place], [place] [address]   Sodium 01/08/2023 138  135 - 145 mmol/L Final   Potassium 01/08/2023 3.7  3.5 - 5.1 mmol/L Final   Chloride 01/08/2023 106  98 - 111 mmol/L Final   CO2 01/08/2023 25  22 - 32 mmol/L Final   Glucose, Bld 01/08/2023 99  70 - 99 mg/dL Final   Glucose reference range applies only to samples taken after fasting for at least 8 hours.   BUN 01/08/2023 27 (H)  8 - 23 mg/dL Final   Creatinine, Ser 01/08/2023 1.04 (H)  0.44 - 1.00 mg/dL Final   Calcium 01/08/2023 9.5  8.9 - 10.3 mg/dL Final   Total Protein 01/08/2023 6.2 (L)  6.5 - 8.1 g/dL Final   Albumin 01/08/2023 3.8  3.5 - 5.0 g/dL Final   AST 01/08/2023 21  15 - 41 U/L Final   ALT 01/08/2023 18  0 - 44 U/L Final   Alkaline Phosphatase 01/08/2023 72  38 - 126 U/L Final   Total Bilirubin 01/08/2023 0.3  0.3 - 1.2 mg/dL Final   GFR, Estimated 01/08/2023 56 (L)  >60 mL/min Final   Comment: (NOTE) Calculated using the CKD-EPI Creatinine Equation (2021)    Anion gap 01/08/2023 7  5 - 15 Final   Performed at [facility], [place], [place] [address]   Color, Urine 01/08/2023 AMBER (A)  YELLOW Final   BIOCHEMICALS [place] BE AFFECTED BY COLOR    APPearance 01/08/2023 HAZY (A)  CLEAR Final   Specific Gravity, Urine 01/08/2023 1.020  1.005 - 1.030 Final   pH 01/08/2023 5.0  5.0 - 8.0 Final   Glucose, UA 01/08/2023 NEGATIVE  NEGATIVE mg/dL Final   Hgb urine dipstick 01/08/2023 NEGATIVE  NEGATIVE Final   Bilirubin Urine 01/08/2023 NEGATIVE  NEGATIVE Final   Ketones, ur 01/08/2023 NEGATIVE  NEGATIVE mg/dL Final   Protein, ur 01/08/2023 NEGATIVE  NEGATIVE mg/dL Final   Nitrite 01/08/2023 NEGATIVE  NEGATIVE Final   Leukocytes,Ua 01/08/2023 SMALL (A)  NEGATIVE Final   WBC, UA 01/08/2023 6-10  0 - 5 WBC/hpf Final   Bacteria, UA 01/08/2023 NONE SEEN  NONE SEEN Final   Squamous Epithelial / HPF 01/08/2023 0-5  0 - 5 /HPF Final   Mucus 01/08/2023 PRESENT   Final   Performed at [facility], [address] [place], [place] [address]   CRP 01/08/2023 1.7 (H)  <1.0 mg/dL Final   Performed at [facility], [address] [place], [place] [address]   Sed Rate 01/08/2023 12  0 - 30 mm/hr Final   Performed at [facility], [place], [place] [address]   Hgb A1c MFr Bld 01/08/2023 5.8 (H)  4.8 - 5.6 % Final   Comment: (NOTE)         Prediabetes: 5.7 - 6.4         Diabetes: >6.4         Glycemic control for adults with diabetes: <7.0    Mean Plasma Glucose 01/08/2023 120  mg/dL Final   Comment: (NOTE) Performed At: [facility] [place], [place] [id number] Rush Farmer MD [id number]     ECG: Date: 01/08/2023 Time ECG obtained: 0924 AM Rate: 82 bpm Rhythm: normal sinus Axis (leads I and aVF): Normal Intervals: PR 198 ms. QRS 78 ms. QTc 457 ms. ST segment and T wave changes: No evidence of acute ST segment elevation or depression.  Evidence of a possible age undetermined anterior infarct present Comparison: Similar to previous tracing obtained on 05/31/2019  IMAGING / PROCEDURES: DIAGNOSTIC RADIOGRAPHS OF LEFT KNEE 3 VIEWS performed on 10/19/2022 Severe loss of  lateral compartment joint space with significant osteophyte formation.   No fractures or osseous abnormalities noted.  MYOCARDIAL PERFUSION IMAGING STUDY (LEXISCAN) performed on 09/12/2021 Normal left ventricular systolic function with a hyperdynamic LVEF of 71% Normal myocardial thickening and wall motion Left ventricular cavity size normal SPECT images demonstrate homogenous tracer distribution throughout the myocardium No evidence of stress-induced myocardial ischemia or arrhythmia Normal low risk study  TRANSTHORACIC ECHOCARDIOGRAM performed on 09/12/2021 Normal left ventricular systolic function with an EF of >55% Left ventricular diastolic Doppler parameters consistent with abnormal relaxation (G1DD). Mild mitral annular calcification Normal right ventricular systolic function Trivial TR and PR Moderate MR Normal transvalvular gradients; no valvular stenosis No pericardial effusion  Impression and Plan:  Sherri Peters has been referred for pre-anesthesia review and clearance prior to her undergoing the planned anesthetic and procedural courses. Available labs, pertinent testing, and imaging results were personally reviewed by me in preparation for upcoming operative/procedural course. [facility] medical record has been updated following extensive record review and patient interview with PAT staff.   This patient has been appropriately cleared by cardiology with an overall LOW risk of significant perioperative cardiovascular complications. Based on clinical review performed today (01/14/23), barring any significant acute changes in the patient's overall condition, it is anticipated that she will be able to proceed with the planned surgical intervention. Any acute changes in clinical condition [place] necessitate her procedure being postponed and/or cancelled. Patient will meet with anesthesia team (MD and/or CRNA) on the day of her procedure for preoperative evaluation/assessment. Questions  regarding anesthetic course will be fielded at that time.   Pre-surgical instructions were reviewed with the patient during her PAT appointment, and questions were fielded to satisfaction by PAT clinical staff. She has been instructed on which medications that she will need to hold prior to surgery, as well as the ones that have been deemed safe/appropriate to take of the day of her procedure. As part of the general education provided by PAT, patient made aware both verbally and in writing, that she would need to abstain from the use of any illegal substances during her perioperative course.  She was advised that failure to follow the provided instructions could necessitate case cancellation or result serious perioperative complications up to and including death. Patient encouraged to contact PAT and/or her surgeon's office to discuss any questions or concerns that [place] arise prior to surgery; verbalized understanding.   Honor Loh, MSN, APRN, FNP-C, CEN [facility]  Peri-operative Services Nurse Practitioner Phone: [phone] Fax: [phone] 01/14/23 12:33 AM  NOTE: This note has been prepared using Dragon dictation software. Despite my best ability to proofread, there is always the potential that unintentional transcriptional errors [place] still occur from this process.

## 2023-01-13 ENCOUNTER — Encounter: Payer: Self-pay | Admitting: Urgent Care

## 2023-01-14 NOTE — H&P (Signed)
ORTHOPAEDIC HISTORY & PHYSICAL Regino Bellow, PA - 01/10/2023 10:15 AM EST Formatting of this note is different from the original. Images from the original note were not included. Chief Complaint Chief Complaint Patient presents with Knee Pain H & P LEFT KNEE  Reason for Visit Sherri Peters is a 77 y.o. who presents today for history and physical. She is to undergo a left total knee arthroplasty on 01/17/2023. Since her last visit here to clinic there is been no change in her condition. The patient expresses her desire to proceed with surgery.  She reports a long history of left knee pain. She localizes most of the pain along the lateral aspect of the knee. She reports some swelling, no locking, and some giving way of the knee. The pain is aggravated by any weight bearing. The knee pain limits the patient's ability to ambulate long distances. The patient has not appreciated any significant improvement despite Tylenol, intraarticular corticosteroid injections, and activity modification. She is unable to tolerate NSAIDs due to chronic kidney disease. She is not using any ambulatory aids. The patient states that the knee pain has progressed to the point that it is significantly interfering with her activities of daily living.  Past Medical History Past Medical History: Diagnosis Date Age-related osteoporosis without current pathological fracture 10/18/2016 Arthritis of knee 12/14/2015 Last Assessment & Plan: The current medical regimen is effective; continue present plan and medications. Bilateral carotid artery stenosis 11/22/2018 Bilateral chronic knee pain 10/18/2016 Carpal tunnel syndrome of left wrist 06/07/2016 Last Assessment & Plan: Discussed splints and care Chronic renal insufficiency, stage 2 (mild) Chronic thumb pain, bilateral 10/18/2016 Coronary artery disease involving native coronary artery of native heart 08/18/2021 CRI (chronic renal insufficiency), stage 3 (moderate)  (CMS-HCC) 11/22/2017 Depression Endophthalmitis, right eye 07/09/2018 Fibromyalgia 05/05/2021 Fibromyalgia muscle pain 05/05/2021 Finger pain, left Finger pain, right Gastroesophageal reflux disease without esophagitis 11/22/2017 H/O radioactive iodine thyroid ablation History of chicken pox Hyperlipidemia 11/22/2017 Last Assessment & Plan: The current medical regimen is effective; continue present plan and medications. Hypertension Hypothyroid 11/22/2017 Last Assessment & Plan: The current medical regimen is effective; continue present plan and medications. Kidney disease Obesity 2019 OSA on CPAP 05/18/2015 Doing well using every night Chronic headaches have resolved Osteoarthritis Other emphysema (CMS-HCC) 11/22/2017 Prediabetes 05/26/2022 Pseudophakia of right eye 07/09/2018 06/27/18 - Complex - followed by Endophthalmitis Restless leg syndrome 12/14/2015 Last Assessment & Plan: Chest restless legs will increase Lyrica 200 mg twice a day patient Domek increase to 300 mg a day if needed. Shingles 08/23/2015 Last Assessment & Plan: Discussed shingles care and treatment. That she is contagious for chickenpox and avoid pregnant women and children. To be sure and wash her hands after touching her scalp and rash area. Discussed treatment will not use prednisone because of history of possible allergic reaction to prednisone. We will use acyclovir but understands its late in the process and Coluccio not be very Sleep apnea Statin intolerance 06/17/2021 Tobacco abuse [id number] Umbilical hernia without obstruction and without gangrene 09/18/2016 Vision abnormalities  Past Surgical History Past Surgical History: Procedure Laterality Date LAPAROSCOPIC REPAIR UMBILICAL HERNIA N/A [id number] Procedure: LAPAROSCOPY, SURGICAL; REPAIR, UMBILICAL HERNIA (INCLUDES MESH INSERTION, WHEN PERFORMED); REDUCIBLE - laparoscopic umbilical hernia repair. GETA; Surgeon: Nida Boatman, MD;  Location: [facility]; Service: General Surgery; Laterality: N/A; laparoscopic umbilical hernia repair. GETA EXTRACTION CATARACT EXTRACAPSULAR W/INSERTION INTRAOCULAR PROSTHESIS Right 06/27/2018 R- LENSX / ORA - EXTRACTION CATARACT EXTRACAPSULAR W/INSERTION INTRAOCULAR PROSTHESIS by Dr. Doyce Para EXTRACTION CATARACT EXTRACAPSULAR W/INSERTION INTRAOCULAR  PROSTHESIS Right 06/27/2018 Procedure: R- LENSX / ORA - EXTRACTION CATARACT EXTRACAPSULAR W/INSERTION INTRAOCULAR PROSTHESIS; Surgeon: Doyce Para, MD; Location: [facility]; Service: Ophthalmology; Laterality: Right; LenSx/ORA INJECTION INTRAVITREAL PHARMACOLOGIC AGENT Right 07/05/2018 SubTenons Decadron CHOLECYSTECTOMY 12/08/2019 ARTHROPLASTY TOTAL KNEE Right 05/24/2020 Procedure: ARTHROPLASTY, KNEE, CONDYLE AND PLATEAU; MEDIAL AND LATERAL COMPARTMENTSWITH OR WITHOUT PATELLA RESURFACING (TOTAL KNEE ARTHROPLASTY); Surgeon: Mardi Mainland, MD; Location: [place]; Service: Orthopedics; Laterality: Right; ABDOMINAL HYSTERECTOMY 1980s APPENDECTOMY 1980s COLONOSCOPY HERNIA REPAIR 2018, Feb Navel HYSTERECTOMY 1980s TONSILLECTOMY 1950s  Past Family History Family History Problem Relation Age of Onset Coronary Artery Disease (Blocked arteries around heart) Mother Heart disease Mother High blood pressure (Hypertension) Mother Hyperlipidemia (Elevated cholesterol) Mother Osteoarthritis Mother Reflux disease Mother Myocardial Infarction (Heart attack) Mother Peripheral Vascular Disease (PVD or blocked arteries in arms and legs) Mother Anesthesia problems Mother cardiac arrest with epidural Coronary Artery Disease (Blocked arteries around heart) Father Stroke Father Heart disease Father High blood pressure (Hypertension) Father Hyperlipidemia (Elevated cholesterol) Father Reflux disease Father Myocardial Infarction (Heart attack) Father Depression Sister Stroke Daughter Skin cancer Daughter Obesity Sister Glaucoma  Sister Coronary Artery Disease (Blocked arteries around heart) Sister Hyperlipidemia (Elevated cholesterol) Sister High blood pressure (Hypertension) Sister Hyperlipidemia (Elevated cholesterol) Sister Kidney disease Sister Reflux disease Sister Coronary Artery Disease (Blocked arteries around heart) Sister Heart disease Sister High blood pressure (Hypertension) Sister Hyperlipidemia (Elevated cholesterol) Sister Reflux disease Sister High blood pressure (Hypertension) Sister Cerebral Aneurysm rupture survivor Hyperlipidemia (Elevated cholesterol) Sister Reflux disease Sister High blood pressure (Hypertension) Sister Hyperlipidemia (Elevated cholesterol) Sister Reflux disease Sister Malignant hyperthermia Neg Hx  Medications Current Outpatient Medications Ordered in Epic Medication Sig Dispense Refill acetaminophen (TYLENOL) 650 MG ER tablet Take 1 tablet (650 mg total) by mouth 3 (three) times a day 30 tablet 0 ascorbic acid, vitamin C, (VITAMIN C) 500 MG tablet Take 500 mg by mouth once daily atorvastatin (LIPITOR) 40 MG tablet Take 1 tablet (40 mg total) by mouth at bedtime 90 tablet 3 b complex vitamins capsule Take 1 capsule by mouth once daily buPROPion (WELLBUTRIN XL) 150 MG XL tablet TAKE 1 TABLET ONE TIME DAILY 90 tablet 3 busPIRone (BUSPAR) 5 MG tablet Take 1 tablet (5 mg total) by mouth 2 (two) times daily as needed 30 tablet 6 cholecalciferol (VITAMIN D3) 2,000 unit capsule Take 2,000 Units by mouth once daily citalopram (CELEXA) 20 MG tablet Take 1 tablet (20 mg total) by mouth once daily 90 tablet 3 levothyroxine (SYNTHROID) 75 MCG tablet TAKE 1 TABLET ONE TIME DAILY ON AN EMPTY STOMACH WITH A GLASS OF WATER AT LEAST 30 TO 60 MINUTES BEFORE BREAKFAST 90 tablet 3 metFORMIN (GLUCOPHAGE) 500 MG tablet Take 500 mg by mouth daily with breakfast omeprazole (PRILOSEC) 20 MG DR capsule Take 1 capsule (20 mg total) by mouth once daily 90 capsule 3 pregabalin (LYRICA) 100 MG  capsule Take 1 capsule (100 mg total) by mouth 3 (three) times daily for 90 days 90 capsule 2 telmisartan-hydroCHLOROthiazide (MICARDIS HCT) 80-12.5 mg tablet Take 1 tablet by mouth once daily 90 tablet 3 triamcinolone 0.1 % cream Apply topically 2 (two) times daily 80 g 0 pregabalin (LYRICA) 100 MG capsule Take 1 capsule (100 mg total) by mouth 3 (three) times daily for 3 days 9 capsule 0  No current Epic-ordered facility-administered medications on file.  Allergies Allergies Allergen Reactions Fluoxetine Hallucination Cymbalta [Duloxetine] Diarrhea, Nausea and Vomiting Gabapentin Swelling Peripheral edema Levofloxacin Nausea Meloxicam Swelling Peripheral edema   Review of Systems A comprehensive 14 point ROS was  performed, reviewed, and the pertinent orthopaedic findings are documented in the HPI.  Exam BP 126/70 (BP Location: Left upper arm, Patient Position: Sitting, BP Cuff Size: Adult)  Ht 160 cm ('5\' 3"'$ )  Wt 83.9 kg (185 lb)  BMI 32.77 kg/m  General: Well-developed well-nourished female seen in no acute distress.  HEENT: Atraumatic,normocephalic. Pupils are equal and reactive to light. Oropharynx is clear with moist mucosa  Lungs: Clear to auscultation bilaterally  Cardiovascular: Regular rate and rhythm. Normal S1, S2. No murmurs. No appreciable gallops or rubs. Peripheral pulses are palpable.  Abdomen: Soft, non-tender, nondistended. Bowel sounds present  Extremity: Left Knee: Soft tissue swelling: mild Effusion: none Erythema: none Crepitance: mild Tenderness: lateral Alignment: relative valgus Mediolateral laxity: lateral pseudolaxity Posterior sag: negative Patellar tracking: Good tracking without evidence of subluxation or tilt Atrophy: No significant atrophy. Quadriceps tone was fair to good. Range of motion: 0/0/110 degrees  Neurological:  The patient is alert and oriented Sensation to light touch appears to be intact and within normal  limits Gross motor strength appeared to be equal to 5/5  Vascular :  Peripheral pulses felt to be palpable. Capillary refill appears to be intact and within normal limits  X-ray  X-rays taken in [facility] on 10/19/2022 showed significant narrowing of the lateral compartment space with bone-on-bone articulation being noted. There was some increased valgus deformity noted. Osteophytes as well as subchondral porosis is noted. Patella tracking well. No evidence of any fractures or dislocation.  Impression  1. Degenerative arthrosis left knee  Plan  1. Have gone over with patient medication on today's visit. Patient is to discontinue her metformin 2 days prior to surgery currently taking no anticoagulation medication. 2. Past medical history was reviewed 3. Postop rehab course was discussed 4. Return to clinic 2 weeks postop. Sooner if any problems  This note was generated in part with voice recognition software and I apologize for any typographical errors that were not detected and corrected   Watt Climes PA Electronically signed by Regino Bellow, PA at 01/10/2023 11:17 AM EST

## 2023-01-16 ENCOUNTER — Encounter: Payer: Self-pay | Admitting: Orthopedic Surgery

## 2023-01-16 MED ORDER — CHLORHEXIDINE GLUCONATE 4 % EX LIQD: 60.0000 mL | Freq: Once | CUTANEOUS | Status: DC

## 2023-01-16 MED ORDER — TRANEXAMIC ACID-NACL 1000-0.7 MG/100ML-% IV SOLN: 1000.0000 mg | INTRAVENOUS | Status: DC

## 2023-01-16 MED ORDER — SODIUM CHLORIDE 0.9 % IV SOLN: INTRAVENOUS | Status: DC

## 2023-01-16 MED ORDER — ORAL CARE MOUTH RINSE: 15.0000 mL | Freq: Once | OROMUCOSAL | Status: AC

## 2023-01-16 MED ORDER — CHLORHEXIDINE GLUCONATE 0.12 % MT SOLN: 15.0000 mL | Freq: Once | OROMUCOSAL | Status: AC

## 2023-01-16 MED ORDER — DEXAMETHASONE SODIUM PHOSPHATE 10 MG/ML IJ SOLN: 8.0000 mg | Freq: Once | INTRAMUSCULAR | Status: DC

## 2023-01-16 MED ORDER — CEFAZOLIN SODIUM-DEXTROSE 2-4 GM/100ML-% IV SOLN: 2.0000 g | INTRAVENOUS | Status: DC

## 2023-01-17 ENCOUNTER — Observation Stay: Payer: Medicare PPO

## 2023-01-17 ENCOUNTER — Ambulatory Visit: Payer: Medicare PPO | Admitting: Urgent Care

## 2023-01-17 ENCOUNTER — Encounter
Admission: RE | Disposition: A | Discharge status: Home / Self Care | Payer: Self-pay | Source: Home / Self Care | Attending: Orthopedic Surgery

## 2023-01-17 ENCOUNTER — Other Ambulatory Visit: Payer: Self-pay

## 2023-01-17 ENCOUNTER — Observation Stay
Admission: RE | Admit: 2023-01-17 | Discharge: 2023-01-18 | Disposition: A | Discharge status: Home / Self Care | Payer: Medicare PPO | Attending: Orthopedic Surgery | Admitting: Orthopedic Surgery

## 2023-01-17 ENCOUNTER — Encounter: Payer: Self-pay | Admitting: Orthopedic Surgery

## 2023-01-17 DIAGNOSIS — M1712 Unilateral primary osteoarthritis, left knee: Principal | ICD-10-CM | POA: Insufficient documentation

## 2023-01-17 DIAGNOSIS — Z79899 Other long term (current) drug therapy: Secondary | ICD-10-CM | POA: Diagnosis not present

## 2023-01-17 DIAGNOSIS — I2510 Atherosclerotic heart disease of native coronary artery without angina pectoris: Secondary | ICD-10-CM | POA: Diagnosis not present

## 2023-01-17 DIAGNOSIS — I129 Hypertensive chronic kidney disease with stage 1 through stage 4 chronic kidney disease, or unspecified chronic kidney disease: Secondary | ICD-10-CM | POA: Insufficient documentation

## 2023-01-17 DIAGNOSIS — R7303 Prediabetes: Secondary | ICD-10-CM

## 2023-01-17 DIAGNOSIS — J449 Chronic obstructive pulmonary disease, unspecified: Secondary | ICD-10-CM | POA: Insufficient documentation

## 2023-01-17 DIAGNOSIS — E039 Hypothyroidism, unspecified: Secondary | ICD-10-CM | POA: Insufficient documentation

## 2023-01-17 DIAGNOSIS — N182 Chronic kidney disease, stage 2 (mild): Secondary | ICD-10-CM | POA: Diagnosis not present

## 2023-01-17 DIAGNOSIS — Z96659 Presence of unspecified artificial knee joint: Secondary | ICD-10-CM

## 2023-01-17 HISTORY — DX: Atherosclerosis of aorta: I70.0

## 2023-01-17 HISTORY — DX: Unspecified purulent endophthalmitis, right eye: H44.001

## 2023-01-17 HISTORY — DX: Carpal tunnel syndrome, left upper limb: G56.02

## 2023-01-17 HISTORY — DX: Umbilical hernia without obstruction or gangrene: K42.9

## 2023-01-17 HISTORY — DX: Obstructive sleep apnea (adult) (pediatric): G47.33

## 2023-01-17 HISTORY — DX: Restless legs syndrome: G25.81

## 2023-01-17 HISTORY — DX: Other specified health status: Z78.9

## 2023-01-17 HISTORY — DX: Chronic kidney disease, stage 2 (mild): N18.2

## 2023-01-17 HISTORY — DX: Disorder of arteries and arterioles, unspecified: I77.9

## 2023-01-17 HISTORY — PX: KNEE ARTHROPLASTY: SHX992

## 2023-01-17 HISTORY — DX: Fibromyalgia: M79.7

## 2023-01-17 HISTORY — DX: Other ill-defined heart diseases: I51.89

## 2023-01-17 LAB — GLUCOSE, CAPILLARY
Glucose-Capillary: 87 mg/dL (ref 70–99)
Glucose-Capillary: 164 mg/dL — ABNORMAL HIGH (ref 70–99)
Glucose-Capillary: 187 mg/dL — ABNORMAL HIGH (ref 70–99)
Glucose-Capillary: 161 mg/dL — ABNORMAL HIGH (ref 70–99)

## 2023-01-17 LAB — ABO/RH: ABO/RH(D): O POS

## 2023-01-17 SURGERY — ARTHROPLASTY, KNEE, TOTAL, USING IMAGELESS COMPUTER-ASSISTED NAVIGATION
Anesthesia: Spinal | Site: Knee | Laterality: Left

## 2023-01-17 MED ORDER — MAGNESIUM HYDROXIDE 400 MG/5ML PO SUSP: 30.0000 mL | Freq: Every day | ORAL | Status: DC

## 2023-01-17 MED ORDER — INSULIN ASPART 100 UNIT/ML IJ SOLN: 0.0000 [IU] | Freq: Three times a day (TID) | INTRAMUSCULAR | Status: DC

## 2023-01-17 MED ORDER — IRBESARTAN 150 MG PO TABS
150.0000 mg | ORAL_TABLET | Freq: Every day | ORAL | Status: DC
  Filled 2023-01-17: qty 1

## 2023-01-17 MED ORDER — TRAMADOL HCL 50 MG PO TABS
ORAL_TABLET | ORAL | Status: AC
  Filled 2023-01-17: qty 1

## 2023-01-17 MED ORDER — LEVOTHYROXINE SODIUM 75 MCG PO TABS
75.0000 ug | ORAL_TABLET | Freq: Every day | ORAL | Status: DC
  Administered 2023-01-18: 75 ug via ORAL
  Filled 2023-01-17: qty 1

## 2023-01-17 MED ORDER — SENNOSIDES-DOCUSATE SODIUM 8.6-50 MG PO TABS: 1.0000 | ORAL_TABLET | Freq: Two times a day (BID) | ORAL | Status: DC

## 2023-01-17 MED ORDER — ATORVASTATIN CALCIUM 20 MG PO TABS: 40.0000 mg | ORAL_TABLET | Freq: Every day | ORAL | Status: DC

## 2023-01-17 MED ORDER — ACETAMINOPHEN 325 MG PO TABS
325.0000 mg | ORAL_TABLET | Freq: Four times a day (QID) | ORAL | Status: DC | PRN
  Administered 2023-01-18: 650 mg via ORAL

## 2023-01-17 MED ORDER — BUPIVACAINE HCL (PF) 0.5 % IJ SOLN
INTRAMUSCULAR | Status: DC | PRN
  Administered 2023-01-17: 3 mL

## 2023-01-17 MED ORDER — SENNOSIDES-DOCUSATE SODIUM 8.6-50 MG PO TABS
ORAL_TABLET | ORAL | Status: AC
  Administered 2023-01-17: 1 via ORAL
  Filled 2023-01-17: qty 1

## 2023-01-17 MED ORDER — FERROUS SULFATE 325 (65 FE) MG PO TABS
ORAL_TABLET | ORAL | Status: AC
  Administered 2023-01-17: 325 mg via ORAL
  Filled 2023-01-17: qty 1

## 2023-01-17 MED ORDER — INSULIN ASPART 100 UNIT/ML IJ SOLN
INTRAMUSCULAR | Status: AC
  Filled 2023-01-17: qty 1

## 2023-01-17 MED ORDER — PROPOFOL 10 MG/ML IV BOLUS
INTRAVENOUS | Status: AC
  Filled 2023-01-17: qty 20

## 2023-01-17 MED ORDER — METOCLOPRAMIDE HCL 10 MG PO TABS
ORAL_TABLET | ORAL | Status: AC
  Filled 2023-01-17: qty 1

## 2023-01-17 MED ORDER — CEFAZOLIN SODIUM-DEXTROSE 2-3 GM-%(50ML) IV SOLR
INTRAVENOUS | Status: DC | PRN
  Administered 2023-01-17: 2 g via INTRAVENOUS

## 2023-01-17 MED ORDER — CITALOPRAM HYDROBROMIDE 20 MG PO TABS
40.0000 mg | ORAL_TABLET | Freq: Every day | ORAL | Status: DC
  Filled 2023-01-17: qty 2

## 2023-01-17 MED ORDER — HYDROMORPHONE HCL 1 MG/ML IJ SOLN: 0.5000 mg | INTRAMUSCULAR | Status: DC | PRN

## 2023-01-17 MED ORDER — ALBUTEROL SULFATE (2.5 MG/3ML) 0.083% IN NEBU: 2.5000 mg | INHALATION_SOLUTION | Freq: Four times a day (QID) | RESPIRATORY_TRACT | Status: DC | PRN

## 2023-01-17 MED ORDER — PREGABALIN 50 MG PO CAPS: 100.0000 mg | ORAL_CAPSULE | Freq: Three times a day (TID) | ORAL | Status: DC

## 2023-01-17 MED ORDER — SODIUM CHLORIDE 0.9 % IV SOLN: INTRAVENOUS | Status: DC

## 2023-01-17 MED ORDER — ALUM & MAG HYDROXIDE-SIMETH 200-200-20 MG/5ML PO SUSP: 30.0000 mL | ORAL | Status: DC | PRN

## 2023-01-17 MED ORDER — PHENYLEPHRINE HCL-NACL 20-0.9 MG/250ML-% IV SOLN
INTRAVENOUS | Status: AC
  Filled 2023-01-17: qty 250

## 2023-01-17 MED ORDER — FENTANYL CITRATE (PF) 100 MCG/2ML IJ SOLN
INTRAMUSCULAR | Status: AC
  Filled 2023-01-17: qty 2

## 2023-01-17 MED ORDER — CHLORHEXIDINE GLUCONATE 0.12 % MT SOLN
OROMUCOSAL | Status: AC
  Administered 2023-01-17: 15 mL via OROMUCOSAL
  Filled 2023-01-17: qty 15

## 2023-01-17 MED ORDER — PROPOFOL 500 MG/50ML IV EMUL
INTRAVENOUS | Status: DC | PRN
  Administered 2023-01-17: 50 ug/kg/min via INTRAVENOUS

## 2023-01-17 MED ORDER — ONDANSETRON HCL 4 MG/2ML IJ SOLN: 4.0000 mg | Freq: Four times a day (QID) | INTRAMUSCULAR | Status: DC | PRN

## 2023-01-17 MED ORDER — OXYCODONE HCL 5 MG PO TABS
ORAL_TABLET | ORAL | Status: AC
  Filled 2023-01-17: qty 1

## 2023-01-17 MED ORDER — TELMISARTAN-HCTZ 80-12.5 MG PO TABS: 1.0000 | ORAL_TABLET | Freq: Every day | ORAL | Status: DC

## 2023-01-17 MED ORDER — CEFAZOLIN SODIUM-DEXTROSE 2-4 GM/100ML-% IV SOLN
INTRAVENOUS | Status: AC
  Filled 2023-01-17: qty 100

## 2023-01-17 MED ORDER — ENOXAPARIN SODIUM 30 MG/0.3ML IJ SOSY: 30.0000 mg | PREFILLED_SYRINGE | Freq: Two times a day (BID) | INTRAMUSCULAR | Status: DC

## 2023-01-17 MED ORDER — TRANEXAMIC ACID-NACL 1000-0.7 MG/100ML-% IV SOLN
INTRAVENOUS | Status: AC
  Filled 2023-01-17: qty 100

## 2023-01-17 MED ORDER — BUSPIRONE HCL 5 MG PO TABS
5.0000 mg | ORAL_TABLET | Freq: Every day | ORAL | Status: DC
  Administered 2023-01-17: 5 mg via ORAL
  Filled 2023-01-17 (×2): qty 1

## 2023-01-17 MED ORDER — BISACODYL 10 MG RE SUPP: 10.0000 mg | Freq: Every day | RECTAL | Status: DC | PRN

## 2023-01-17 MED ORDER — SODIUM CHLORIDE (PF) 0.9 % IJ SOLN
INTRAMUSCULAR | Status: DC | PRN
  Administered 2023-01-17: 120 mL via INTRAMUSCULAR

## 2023-01-17 MED ORDER — TRAMADOL HCL 50 MG PO TABS
50.0000 mg | ORAL_TABLET | ORAL | Status: DC | PRN
  Administered 2023-01-17: 50 mg via ORAL
  Administered 2023-01-18: 100 mg via ORAL

## 2023-01-17 MED ORDER — DIPHENHYDRAMINE HCL 12.5 MG/5ML PO ELIX: 12.5000 mg | ORAL_SOLUTION | ORAL | Status: DC | PRN

## 2023-01-17 MED ORDER — ACETAMINOPHEN 10 MG/ML IV SOLN
INTRAVENOUS | Status: DC | PRN
  Administered 2023-01-17: 1000 mg via INTRAVENOUS

## 2023-01-17 MED ORDER — SODIUM CHLORIDE 0.9 % IR SOLN
Status: DC | PRN
  Administered 2023-01-17: 3000 mL

## 2023-01-17 MED ORDER — OXYCODONE HCL 5 MG PO TABS
ORAL_TABLET | ORAL | Status: AC
  Administered 2023-01-17: 10 mg
  Filled 2023-01-17: qty 2

## 2023-01-17 MED ORDER — FLEET ENEMA 7-19 GM/118ML RE ENEM: 1.0000 | ENEMA | Freq: Once | RECTAL | Status: DC | PRN

## 2023-01-17 MED ORDER — PANTOPRAZOLE SODIUM 40 MG PO TBEC: 40.0000 mg | DELAYED_RELEASE_TABLET | Freq: Two times a day (BID) | ORAL | Status: DC

## 2023-01-17 MED ORDER — INSULIN ASPART 100 UNIT/ML IJ SOLN: 0.0000 [IU] | Freq: Every day | INTRAMUSCULAR | Status: DC

## 2023-01-17 MED ORDER — METOCLOPRAMIDE HCL 10 MG PO TABS
10.0000 mg | ORAL_TABLET | Freq: Three times a day (TID) | ORAL | Status: DC
  Administered 2023-01-17: 10 mg via ORAL

## 2023-01-17 MED ORDER — ACETAMINOPHEN 10 MG/ML IV SOLN
INTRAVENOUS | Status: AC
  Filled 2023-01-17: qty 100

## 2023-01-17 MED ORDER — MIDAZOLAM HCL 2 MG/2ML IJ SOLN
INTRAMUSCULAR | Status: AC
  Filled 2023-01-17: qty 2

## 2023-01-17 MED ORDER — PREGABALIN 50 MG PO CAPS
ORAL_CAPSULE | ORAL | Status: AC
  Administered 2023-01-17: 100 mg via ORAL
  Filled 2023-01-17: qty 2

## 2023-01-17 MED ORDER — TRANEXAMIC ACID-NACL 1000-0.7 MG/100ML-% IV SOLN: 1000.0000 mg | Freq: Once | INTRAVENOUS | Status: AC

## 2023-01-17 MED ORDER — PROPOFOL 10 MG/ML IV BOLUS
INTRAVENOUS | Status: DC | PRN
  Administered 2023-01-17: 20 mg via INTRAVENOUS

## 2023-01-17 MED ORDER — OXYCODONE HCL 5 MG PO TABS: 10.0000 mg | ORAL_TABLET | ORAL | Status: DC | PRN

## 2023-01-17 MED ORDER — CELECOXIB 200 MG PO CAPS: 200.0000 mg | ORAL_CAPSULE | Freq: Two times a day (BID) | ORAL | Status: DC

## 2023-01-17 MED ORDER — OXYCODONE HCL 5 MG PO TABS
5.0000 mg | ORAL_TABLET | ORAL | Status: DC | PRN
  Administered 2023-01-17 – 2023-01-18 (×3): 5 mg via ORAL

## 2023-01-17 MED ORDER — ONDANSETRON HCL 4 MG PO TABS: 4.0000 mg | ORAL_TABLET | Freq: Four times a day (QID) | ORAL | Status: DC | PRN

## 2023-01-17 MED ORDER — DEXAMETHASONE SODIUM PHOSPHATE 10 MG/ML IJ SOLN
INTRAMUSCULAR | Status: AC
  Administered 2023-01-17: 8 mg via INTRAVENOUS
  Filled 2023-01-17: qty 1

## 2023-01-17 MED ORDER — AZELASTINE HCL 0.1 % NA SOLN: 1.0000 | Freq: Two times a day (BID) | NASAL | Status: DC

## 2023-01-17 MED ORDER — ATORVASTATIN CALCIUM 20 MG PO TABS
ORAL_TABLET | ORAL | Status: AC
  Administered 2023-01-17: 40 mg via ORAL
  Filled 2023-01-17: qty 2

## 2023-01-17 MED ORDER — ACETAMINOPHEN 10 MG/ML IV SOLN
1000.0000 mg | Freq: Four times a day (QID) | INTRAVENOUS | Status: DC
  Administered 2023-01-17: 1000 mg via INTRAVENOUS

## 2023-01-17 MED ORDER — FERROUS SULFATE 325 (65 FE) MG PO TABS: 325.0000 mg | ORAL_TABLET | Freq: Two times a day (BID) | ORAL | Status: DC

## 2023-01-17 MED ORDER — PHENOL 1.4 % MT LIQD: 1.0000 | OROMUCOSAL | Status: DC | PRN

## 2023-01-17 MED ORDER — PROPOFOL 1000 MG/100ML IV EMUL
INTRAVENOUS | Status: AC
  Filled 2023-01-17: qty 100

## 2023-01-17 MED ORDER — FENTANYL CITRATE (PF) 100 MCG/2ML IJ SOLN
INTRAMUSCULAR | Status: DC | PRN
  Administered 2023-01-17 (×4): 25 ug via INTRAVENOUS

## 2023-01-17 MED ORDER — MENTHOL 3 MG MT LOZG: 1.0000 | LOZENGE | OROMUCOSAL | Status: DC | PRN

## 2023-01-17 MED ORDER — FENTANYL CITRATE (PF) 100 MCG/2ML IJ SOLN
25.0000 ug | INTRAMUSCULAR | Status: DC | PRN
  Administered 2023-01-17 (×2): 50 ug via INTRAVENOUS

## 2023-01-17 MED ORDER — MIDAZOLAM HCL 5 MG/5ML IJ SOLN
INTRAMUSCULAR | Status: DC | PRN
  Administered 2023-01-17 (×2): .5 mg via INTRAVENOUS

## 2023-01-17 MED ORDER — METOCLOPRAMIDE HCL 10 MG PO TABS
ORAL_TABLET | ORAL | Status: AC
  Administered 2023-01-17: 10 mg via ORAL
  Filled 2023-01-17: qty 1

## 2023-01-17 MED ORDER — BUPIVACAINE HCL (PF) 0.5 % IJ SOLN
INTRAMUSCULAR | Status: AC
  Filled 2023-01-17: qty 10

## 2023-01-17 MED ORDER — METFORMIN HCL 500 MG PO TABS
ORAL_TABLET | ORAL | Status: AC
  Administered 2023-01-17: 500 mg via ORAL
  Filled 2023-01-17: qty 1

## 2023-01-17 MED ORDER — ORAL CARE MOUTH RINSE: 15.0000 mL | OROMUCOSAL | Status: DC | PRN

## 2023-01-17 MED ORDER — METFORMIN HCL 500 MG PO TABS: 500.0000 mg | ORAL_TABLET | Freq: Two times a day (BID) | ORAL | Status: DC

## 2023-01-17 MED ORDER — HYDROCHLOROTHIAZIDE 12.5 MG PO TABS
12.5000 mg | ORAL_TABLET | Freq: Every day | ORAL | Status: DC
  Filled 2023-01-17: qty 1

## 2023-01-17 MED ORDER — CEFAZOLIN SODIUM-DEXTROSE 2-4 GM/100ML-% IV SOLN
2.0000 g | Freq: Four times a day (QID) | INTRAVENOUS | Status: AC
  Administered 2023-01-17: 2 g via INTRAVENOUS

## 2023-01-17 MED ORDER — SURGIPHOR WOUND IRRIGATION SYSTEM - OPTIME: TOPICAL | Status: DC | PRN

## 2023-01-17 MED ORDER — PHENYLEPHRINE HCL-NACL 20-0.9 MG/250ML-% IV SOLN
INTRAVENOUS | Status: DC | PRN
  Administered 2023-01-17: 30 ug/min via INTRAVENOUS

## 2023-01-17 MED ORDER — BUPROPION HCL ER (XL) 150 MG PO TB24
150.0000 mg | ORAL_TABLET | Freq: Every day | ORAL | Status: DC
  Filled 2023-01-17: qty 1

## 2023-01-17 MED ORDER — MAGNESIUM HYDROXIDE 400 MG/5ML PO SUSP
ORAL | Status: AC
  Administered 2023-01-17: 30 mL via ORAL
  Filled 2023-01-17: qty 30

## 2023-01-17 MED ORDER — CEFAZOLIN SODIUM-DEXTROSE 2-4 GM/100ML-% IV SOLN
INTRAVENOUS | Status: AC
  Administered 2023-01-17: 2 g via INTRAVENOUS
  Filled 2023-01-17: qty 100

## 2023-01-17 MED ORDER — ENSURE PRE-SURGERY PO LIQD
296.0000 mL | Freq: Once | ORAL | Status: AC
  Administered 2023-01-17: 296 mL via ORAL
  Filled 2023-01-17: qty 296

## 2023-01-17 MED ORDER — TRANEXAMIC ACID-NACL 1000-0.7 MG/100ML-% IV SOLN
INTRAVENOUS | Status: DC | PRN
  Administered 2023-01-17 (×2): 1000 mg via INTRAVENOUS

## 2023-01-17 MED ORDER — CELECOXIB 200 MG PO CAPS
ORAL_CAPSULE | ORAL | Status: AC
  Administered 2023-01-17: 200 mg via ORAL
  Filled 2023-01-17: qty 1

## 2023-01-17 MED ORDER — PANTOPRAZOLE SODIUM 40 MG PO TBEC
DELAYED_RELEASE_TABLET | ORAL | Status: AC
  Administered 2023-01-17: 40 mg via ORAL
  Filled 2023-01-17: qty 1

## 2023-01-17 SURGICAL SUPPLY — 75 items
ATTUNE PS FEM LT SZ 7 CEM KNEE (Femur) IMPLANT
ATTUNE PSRP INSR SZ7 6 KNEE (Insert) IMPLANT
BASE TIBIA ATTUNE KNEE SYS SZ6 (Knees) IMPLANT
BATTERY INSTRU NAVIGATION (MISCELLANEOUS) ×4 IMPLANT
BIT DRILL QUICK REL 1/8 2PK SL (DRILL) IMPLANT
BLADE CLIPPER SURG (BLADE) IMPLANT
BLADE SAW 70X12.5 (BLADE) ×1 IMPLANT
BLADE SAW 90X13X1.19 OSCILLAT (BLADE) ×1 IMPLANT
BLADE SAW 90X25X1.19 OSCILLAT (BLADE) ×1 IMPLANT
BONE CEMENT GENTAMICIN (Cement) ×2 IMPLANT
BSPLAT TIB 6 CMNT ROT PLAT STR (Knees) ×1 IMPLANT
BTRY SRG DRVR LF (MISCELLANEOUS) ×4
CEMENT BONE GENTAMICIN 40 (Cement) IMPLANT
COOLER POLAR GLACIER W/PUMP (MISCELLANEOUS) ×1 IMPLANT
CUFF TOURN SGL QUICK 24 (TOURNIQUET CUFF)
CUFF TOURN SGL QUICK 34 (TOURNIQUET CUFF)
CUFF TRNQT CYL 24X4X16.5-23 (TOURNIQUET CUFF) IMPLANT
CUFF TRNQT CYL 34X4.125X (TOURNIQUET CUFF) IMPLANT
DRAPE 3/4 80X56 (DRAPES) ×1 IMPLANT
DRAPE INCISE IOBAN 66X45 STRL (DRAPES) IMPLANT
DRILL QUICK RELEASE 1/8 INCH (DRILL) ×1
DRSG MEPILEX SACRM 8.7X9.8 (GAUZE/BANDAGES/DRESSINGS) ×1 IMPLANT
DRSG NON-ADHERENT DERMACEA 3X4 (GAUZE/BANDAGES/DRESSINGS) ×1 IMPLANT
DRSG OPSITE POSTOP 4X14 (GAUZE/BANDAGES/DRESSINGS) ×1 IMPLANT
DRSG TEGADERM 4X4.75 (GAUZE/BANDAGES/DRESSINGS) ×1 IMPLANT
DURAPREP 26ML APPLICATOR (WOUND CARE) ×2 IMPLANT
ELECT CAUTERY BLADE 6.4 (BLADE) ×1 IMPLANT
ELECT REM PT RETURN 9FT ADLT (ELECTROSURGICAL) ×1
ELECTRODE REM PT RTRN 9FT ADLT (ELECTROSURGICAL) ×1 IMPLANT
EX-PIN ORTHOLOCK NAV 4X150 (PIN) ×2 IMPLANT
GLOVE BIOGEL M STRL SZ7.5 (GLOVE) ×2 IMPLANT
GLOVE SRG 8 PF TXTR STRL LF DI (GLOVE) ×1 IMPLANT
GLOVE SURG UNDER POLY LF SZ8 (GLOVE) ×1
GOWN STRL REUS W/ TWL LRG LVL3 (GOWN DISPOSABLE) ×1 IMPLANT
GOWN STRL REUS W/TWL LRG LVL3 (GOWN DISPOSABLE) ×1
GOWN TOGA ZIPPER T7+ PEEL AWAY (MISCELLANEOUS) ×1 IMPLANT
HANDLE YANKAUER SUCT OPEN TIP (MISCELLANEOUS) ×1 IMPLANT
HEMOVAC 400CC 10FR (MISCELLANEOUS) ×1 IMPLANT
HOLDER FOLEY CATH W/STRAP (MISCELLANEOUS) ×1 IMPLANT
HOOD PEEL AWAY T7 (MISCELLANEOUS) IMPLANT
IV NS IRRIG 3000ML ARTHROMATIC (IV SOLUTION) ×1 IMPLANT
KIT TURNOVER KIT A (KITS) ×1 IMPLANT
KNIFE SCULPS 14X20 (INSTRUMENTS) ×1 IMPLANT
MANIFOLD NEPTUNE II (INSTRUMENTS) ×2 IMPLANT
NDL SPNL 20GX3.5 QUINCKE YW (NEEDLE) ×2 IMPLANT
NEEDLE SPNL 20GX3.5 QUINCKE YW (NEEDLE) ×2 IMPLANT
NS IRRIG 500ML POUR BTL (IV SOLUTION) ×1 IMPLANT
PACK TOTAL KNEE (MISCELLANEOUS) ×1 IMPLANT
PAD ABD DERMACEA PRESS 5X9 (GAUZE/BANDAGES/DRESSINGS) ×2 IMPLANT
PAD WRAPON POLAR KNEE (MISCELLANEOUS) ×1 IMPLANT
PATELLA MEDIAL ATTUN 35MM KNEE (Knees) IMPLANT
PIN DRILL FIX HALF THREAD (BIT) ×2 IMPLANT
PIN FIXATION 1/8DIA X 3INL (PIN) ×1 IMPLANT
PULSAVAC PLUS IRRIG FAN TIP (DISPOSABLE) ×1
SOL PREP PVP 2OZ (MISCELLANEOUS) ×1
SOLUTION IRRIG SURGIPHOR (IV SOLUTION) ×1 IMPLANT
SOLUTION PREP PVP 2OZ (MISCELLANEOUS) ×1 IMPLANT
SPONGE DRAIN TRACH 4X4 STRL 2S (GAUZE/BANDAGES/DRESSINGS) ×1 IMPLANT
STAPLER SKIN PROX 35W (STAPLE) ×1 IMPLANT
STOCKINETTE IMPERV 14X48 (MISCELLANEOUS) ×1 IMPLANT
STRAP TIBIA SHORT (MISCELLANEOUS) ×1 IMPLANT
SUCTION FRAZIER HANDLE 10FR (MISCELLANEOUS) ×1
SUCTION TUBE FRAZIER 10FR DISP (MISCELLANEOUS) ×1 IMPLANT
SUT VIC AB 0 CT1 36 (SUTURE) ×1 IMPLANT
SUT VIC AB 1 CT1 36 (SUTURE) ×2 IMPLANT
SUT VIC AB 2-0 CT2 27 (SUTURE) ×1 IMPLANT
SYR 30ML LL (SYRINGE) ×2 IMPLANT
TIBIA ATTUNE KNEE SYS BASE SZ6 (Knees) ×1 IMPLANT
TIP FAN IRRIG PULSAVAC PLUS (DISPOSABLE) ×1 IMPLANT
TOWEL OR 17X26 4PK STRL BLUE (TOWEL DISPOSABLE) IMPLANT
TOWER CARTRIDGE SMART MIX (DISPOSABLE) ×1 IMPLANT
TRAP FLUID SMOKE EVACUATOR (MISCELLANEOUS) ×1 IMPLANT
TRAY FOLEY MTR SLVR 16FR STAT (SET/KITS/TRAYS/PACK) ×1 IMPLANT
WATER STERILE IRR 1000ML POUR (IV SOLUTION) ×1 IMPLANT
WRAPON POLAR PAD KNEE (MISCELLANEOUS) ×1

## 2023-01-17 NOTE — Interval H&P Note (Signed)
History and Physical Interval Note:  01/17/2023 11:12 AM  Sherri Peters  has presented today for surgery, with the diagnosis of PRIMARY OSTEOARTHRITIS OF LEFT KNEE..  The various methods of treatment have been discussed with the patient and family. After consideration of risks, benefits and other options for treatment, the patient has consented to  Procedure(s): COMPUTER ASSISTED TOTAL KNEE ARTHROPLASTY - RNFA (Left) as a surgical intervention.  The patient's history has been reviewed, patient examined, no change in status, stable for surgery.  I have reviewed the patient's chart and labs.  Questions were answered to the patient's satisfaction.     [name]

## 2023-01-17 NOTE — Anesthesia Preprocedure Evaluation (Signed)
Anesthesia Evaluation  Patient identified by MRN, date of birth, ID band Patient awake    Reviewed: Allergy & Precautions, H&P , NPO status , Patient's Chart, lab work & pertinent test results, reviewed documented beta blocker date and time   History of Anesthesia Complications Negative for: history of anesthetic complications  Airway Mallampati: II  TM Distance: >3 FB Neck ROM: full    Dental  (+) Dental Advidsory Given, Implants, Upper Dentures, Lower Dentures   Pulmonary shortness of breath and with exertion, sleep apnea and Continuous Positive Airway Pressure Ventilation , COPD, neg recent URI, Current Smoker and Patient abstained from smoking.   Pulmonary exam normal breath sounds clear to auscultation       Cardiovascular Exercise Tolerance: Good hypertension, (-) angina + CAD  (-) Past MI and (-) Cardiac Stents Normal cardiovascular exam(-) dysrhythmias (-) Valvular Problems/Murmurs Rhythm:regular Rate:Normal     Neuro/Psych neg Seizures PSYCHIATRIC DISORDERS  Depression       GI/Hepatic Neg liver ROS,GERD  ,,  Endo/Other  diabetes, Well Controlled, Type 2, Oral Hypoglycemic AgentsHypothyroidism    Renal/GU CRFRenal disease  negative genitourinary   Musculoskeletal   Abdominal   Peds  Hematology negative hematology ROS (+)   Anesthesia Other Findings Past Medical History: No date: Aortic atherosclerosis (HCC) No date: Arthritis No date: Bilateral carotid artery disease (HCC) No date: Carpal tunnel syndrome of left wrist No date: CKD (chronic kidney disease), stage II No date: COPD (chronic obstructive pulmonary disease) (HCC) No date: Coronary artery disease No date: Depression No date: Diastolic dysfunction     Comment:  a.) TTE 11/15/2018: EF >55%, mild LVH, MAC, BAE, triv               PR, mild MR/TR, G1DD; b.) TTE 09/12/2021: EF >55%, mild               LVH, MAC, triv TR/PR, mod MR, G1DD No date:  Endophthalmitis, right No date: Fibromyalgia No date: GERD (gastroesophageal reflux disease) 06/2018: History of bilateral cataract extraction No date: Hyperlipidemia No date: Hypertension No date: Hypothyroidism No date: OSA on CPAP No date: Pre-diabetes No date: RLS (restless legs syndrome) No date: Statin intolerance No date: Umbilical hernia     Comment:  a.) s/p repair 11/10/2016   Reproductive/Obstetrics negative OB ROS                             Anesthesia Physical Anesthesia Plan  ASA: 3  Anesthesia Plan: Spinal   Post-op Pain Management:    Induction: Intravenous  PONV Risk Score and Plan: 1 and Propofol infusion, TIVA and Treatment Pedretti vary due to age or medical condition  Airway Management Planned: Natural Airway and Simple Face Mask  Additional Equipment:   Intra-op Plan:   Post-operative Plan:   Informed Consent: I have reviewed the patients History and Physical, chart, labs and discussed the procedure including the risks, benefits and alternatives for the proposed anesthesia with the patient or authorized representative who has indicated his/her understanding and acceptance.     Dental Advisory Given  Plan Discussed with: Anesthesiologist, CRNA and Surgeon  Anesthesia Plan Comments:        Anesthesia Quick Evaluation

## 2023-01-17 NOTE — Anesthesia Procedure Notes (Signed)
Date/Time: 01/17/2023 12:03 PM  Performed by: Demetrius Charity, CRNAPre-anesthesia Checklist: Patient identified, Emergency Drugs available, Suction available, Patient being monitored and Timeout performed Patient Re-evaluated:Patient Re-evaluated prior to induction Oxygen Delivery Method: Simple face mask Induction Type: IV induction Placement Confirmation: CO2 detector and positive ETCO2

## 2023-01-17 NOTE — Op Note (Signed)
OPERATIVE NOTE  DATE OF SURGERY:  01/17/2023  PATIENT NAME:  Sherri Peters   DOB: 1946-07-28  MRN: [id number]  PRE-OPERATIVE DIAGNOSIS: Degenerative arthrosis of the left knee, primary  POST-OPERATIVE DIAGNOSIS:  Same  PROCEDURE:  Left total knee arthroplasty using computer-assisted navigation  SURGEON:  Marciano Sequin. M.D.  ANESTHESIA: spinal  ESTIMATED BLOOD LOSS: 50 mL  FLUIDS REPLACED: 1000 mL of crystalloid  TOURNIQUET TIME: 107 minutes  DRAINS: 2 medium Hemovac drains  SOFT TISSUE RELEASES: Anterior cruciate ligament, posterior cruciate ligament, deep medial collateral ligament, patellofemoral ligament, and posterolateral corner  IMPLANTS UTILIZED: DePuy Attune size 7 posterior stabilized femoral component (cemented), size 6 rotating platform tibial component (cemented), 35 mm medialized dome patella (cemented), and a 6 mm stabilized rotating platform polyethylene insert.  INDICATIONS FOR SURGERY: Sherri Peters is a 77 y.o. year old female with a long history of progressive knee pain. X-rays demonstrated severe degenerative changes in tricompartmental fashion. The patient had not seen any significant improvement despite conservative nonsurgical intervention. After discussion of the risks and benefits of surgical intervention, the patient expressed understanding of the risks benefits and agree with plans for total knee arthroplasty.   The risks, benefits, and alternatives were discussed at length including but not limited to the risks of infection, bleeding, nerve injury, stiffness, blood clots, the need for revision surgery, cardiopulmonary complications, among others, and they were willing to proceed.  PROCEDURE IN DETAIL: The patient was brought into the operating room and, after adequate spinal anesthesia was achieved, a tourniquet was placed on the patient's upper thigh. The patient's knee and leg were cleaned and prepped with alcohol and DuraPrep and draped in the usual  sterile fashion. A "timeout" was performed as per usual protocol. The lower extremity was exsanguinated using an Esmarch, and the tourniquet was inflated to 300 mmHg. An anterior longitudinal incision was made followed by a standard mid vastus approach. The deep fibers of the medial collateral ligament were elevated in a subperiosteal fashion off of the medial flare of the tibia so as to maintain a continuous soft tissue sleeve. The patella was subluxed laterally and the patellofemoral ligament was incised. Inspection of the knee demonstrated severe degenerative changes with full-thickness loss of articular cartilage. Osteophytes were debrided using a rongeur. Anterior and posterior cruciate ligaments were excised. Two 4.0 mm Schanz pins were inserted in the femur and into the tibia for attachment of the array of trackers used for computer-assisted navigation. Hip center was identified using a circumduction technique. Distal landmarks were mapped using the computer. The distal femur and proximal tibia were mapped using the computer. The distal femoral cutting guide was positioned using computer-assisted navigation so as to achieve a 5 distal valgus cut. The femur was sized and it was felt that a size 7 femoral component was appropriate. A size 7 femoral cutting guide was positioned and the anterior cut was performed and verified using the computer. This was followed by completion of the posterior and chamfer cuts. Femoral cutting guide for the central box was then positioned in the center box cut was performed.  Attention was then directed to the proximal tibia. Medial and lateral menisci were excised. The extramedullary tibial cutting guide was positioned using computer-assisted navigation so as to achieve a 0 varus-valgus alignment and 3 posterior slope. The cut was performed and verified using the computer. The proximal tibia was sized and it was felt that a size 6 tibial tray was appropriate. Tibial and  femoral trials were inserted  followed by insertion of a 5 mm polyethylene insert. The knee was felt to be tight laterally.  The trial components were removed and the knee was brought into full extension and distracted using the Moreland retractors.  The posterolateral corner was carefully released using a combination of electrocautery and Metzenbaum scissors.  Trial components were reinserted followed by placement of a 6 mm polyethylene trial.  This allowed for excellent mediolateral soft tissue balancing both in flexion and in full extension. Finally, the patella was cut and prepared so as to accommodate a 35 mm medialized dome patella. A patella trial was placed and the knee was placed through a range of motion with excellent patellar tracking appreciated. The femoral trial was removed after debridement of posterior osteophytes. The central post-hole for the tibial component was reamed followed by insertion of a keel punch. Tibial trials were then removed. Cut surfaces of bone were irrigated with copious amounts of normal saline using pulsatile lavage and then suctioned dry. Polymethylmethacrylate cement with gentamicin was prepared in the usual fashion using a vacuum mixer. Cement was applied to the cut surface of the proximal tibia as well as along the undersurface of a size 6 rotating platform tibial component. Tibial component was positioned and impacted into place. Excess cement was removed using [employer] fast streamer. Cement was then applied to the cut surfaces of the femur as well as along the posterior flanges of the size 7 femoral component. The femoral component was positioned and impacted into place. Excess cement was removed using [employer] fast streamer. A 6 mm polyethylene trial was inserted and the knee was brought into full extension with steady axial compression applied. Finally, cement was applied to the backside of a 35 mm medialized dome patella and the patellar component was positioned and patellar clamp  applied. Excess cement was removed using [employer] fast streamer. After adequate curing of the cement, the tourniquet was deflated after a total tourniquet time of 107 minutes. Hemostasis was achieved using electrocautery. The knee was irrigated with copious amounts of normal saline using pulsatile lavage followed by 450 ml of Surgiphor and then suctioned dry. 20 mL of 1.3% Exparel and 60 mL of 0.25% Marcaine in 40 mL of normal saline was injected along the posterior capsule, medial and lateral gutters, and along the arthrotomy site. A 6 mm stabilized rotating platform polyethylene insert was inserted and the knee was placed through a range of motion with excellent mediolateral soft tissue balancing appreciated and excellent patellar tracking noted. 2 medium drains were placed in the wound bed and brought out through separate stab incisions. The medial parapatellar portion of the incision was reapproximated using interrupted sutures of #1 Vicryl. Subcutaneous tissue was approximated in layers using first #0 Vicryl followed #2-0 Vicryl. The skin was approximated with skin staples. A sterile dressing was applied.  The patient tolerated the procedure well and was transported to the recovery room in stable condition.    Orrin Yurkovich P. Holley Bouche., M.D.

## 2023-01-17 NOTE — Transfer of Care (Signed)
Immediate Anesthesia Transfer of Care Note  Patient: Sherri Peters  Procedure(s) Performed: COMPUTER ASSISTED TOTAL KNEE ARTHROPLASTY (Left: Knee)  Patient Location: PACU  Anesthesia Type:Spinal  Level of Consciousness: awake  Airway & Oxygen Therapy: Patient Spontanous Breathing  Post-op Assessment: Report given to RN and Post -op Vital signs reviewed and stable  Post vital signs: Reviewed and stable  Last Vitals:  Vitals Value Taken Time  BP 126/80 01/17/23 1547  Temp    Pulse 86 01/17/23 1547  Resp 11 01/17/23 1547  SpO2 94 % 01/17/23 1547    Last Pain:  Vitals:   01/17/23 0948  TempSrc: Temporal  PainSc: 0-No pain         Complications: No notable events documented.

## 2023-01-17 NOTE — Progress Notes (Signed)
Pt ambulated to BR with walker without difficulty. Pt unclipped hemovac in the bathroom and the hemovac dropped on the floor. After ambulating to BR the hemovac did not maintain suction. The canister was changed by the OR staff but the suction is not working The canister was clipped to the side of the bed, lower than the knee. Dr Marry Guan notified via secure chat.

## 2023-01-17 NOTE — Anesthesia Procedure Notes (Signed)
Spinal  Patient location during procedure: OR Start time: 01/17/2023 11:35 AM End time: 01/17/2023 11:40 AM Reason for block: surgical anesthesia Staffing Performed: resident/CRNA  Resident/CRNA: Demetrius Charity, CRNA Performed by: Demetrius Charity, CRNA Authorized by: Martha Clan, MD   Preanesthetic Checklist Completed: patient identified, IV checked, site marked, risks and benefits discussed, surgical consent, monitors and equipment checked, pre-op evaluation and timeout performed Spinal Block Patient position: sitting Prep: ChloraPrep Patient monitoring: heart rate, continuous pulse ox, blood pressure and cardiac monitor Approach: midline Location: L3-4 Injection technique: single-shot Needle Needle type: Whitacre and Introducer  Needle gauge: 25 G Needle length: 9 cm Assessment Sensory level: T10 Events: CSF return Additional Notes Sterile aseptic technique used throughout the procedure.  Negative paresthesia. Negative blood return. Positive free-flowing CSF. Expiration date of kit checked and confirmed. Patient tolerated procedure well, without complications.

## 2023-01-18 ENCOUNTER — Encounter: Payer: Self-pay | Admitting: Orthopedic Surgery

## 2023-01-18 DIAGNOSIS — M1712 Unilateral primary osteoarthritis, left knee: Secondary | ICD-10-CM | POA: Diagnosis not present

## 2023-01-18 LAB — GLUCOSE, CAPILLARY: Glucose-Capillary: 100 mg/dL — ABNORMAL HIGH (ref 70–99)

## 2023-01-18 MED ORDER — TRAMADOL HCL 50 MG PO TABS
ORAL_TABLET | ORAL | Status: AC
  Filled 2023-01-18: qty 2

## 2023-01-18 MED ORDER — CELECOXIB 200 MG PO CAPS
ORAL_CAPSULE | ORAL | Status: AC
  Filled 2023-01-18: qty 1

## 2023-01-18 MED ORDER — ACETAMINOPHEN 500 MG PO TABS
ORAL_TABLET | ORAL | Status: AC
  Filled 2023-01-18: qty 2

## 2023-01-18 MED ORDER — FERROUS SULFATE 325 (65 FE) MG PO TABS
ORAL_TABLET | ORAL | Status: AC
  Administered 2023-01-18: 325 mg via ORAL
  Filled 2023-01-18: qty 1

## 2023-01-18 MED ORDER — OXYCODONE HCL 5 MG PO TABS
ORAL_TABLET | ORAL | Status: AC
  Filled 2023-01-18: qty 1

## 2023-01-18 MED ORDER — ENOXAPARIN SODIUM 30 MG/0.3ML IJ SOSY
PREFILLED_SYRINGE | INTRAMUSCULAR | Status: AC
  Administered 2023-01-18: 30 mg via SUBCUTANEOUS
  Filled 2023-01-18: qty 0.3

## 2023-01-18 MED ORDER — PREGABALIN 50 MG PO CAPS
ORAL_CAPSULE | ORAL | Status: AC
  Administered 2023-01-18: 100 mg via ORAL
  Filled 2023-01-18: qty 2

## 2023-01-18 MED ORDER — ENOXAPARIN SODIUM 40 MG/0.4ML IJ SOSY: 40.0000 mg | PREFILLED_SYRINGE | INTRAMUSCULAR | 0 refills | Status: DC

## 2023-01-18 MED ORDER — METFORMIN HCL 500 MG PO TABS
ORAL_TABLET | ORAL | Status: AC
  Administered 2023-01-18: 500 mg via ORAL
  Filled 2023-01-18: qty 1

## 2023-01-18 MED ORDER — TRAMADOL HCL 50 MG PO TABS: 50.0000 mg | ORAL_TABLET | ORAL | 0 refills | Status: DC | PRN

## 2023-01-18 MED ORDER — METOCLOPRAMIDE HCL 10 MG PO TABS
ORAL_TABLET | ORAL | Status: AC
  Administered 2023-01-18: 10 mg via ORAL
  Filled 2023-01-18: qty 1

## 2023-01-18 MED ORDER — MAGNESIUM HYDROXIDE 400 MG/5ML PO SUSP
ORAL | Status: AC
  Filled 2023-01-18: qty 30

## 2023-01-18 MED ORDER — OXYCODONE HCL 5 MG PO TABS: 5.0000 mg | ORAL_TABLET | ORAL | 0 refills | Status: DC | PRN

## 2023-01-18 MED ORDER — SENNOSIDES-DOCUSATE SODIUM 8.6-50 MG PO TABS
ORAL_TABLET | ORAL | Status: AC
  Filled 2023-01-18: qty 1

## 2023-01-18 MED ORDER — ACETAMINOPHEN 10 MG/ML IV SOLN
INTRAVENOUS | Status: AC
  Administered 2023-01-18: 1000 mg
  Filled 2023-01-18: qty 100

## 2023-01-18 MED ORDER — PANTOPRAZOLE SODIUM 40 MG PO TBEC
DELAYED_RELEASE_TABLET | ORAL | Status: AC
  Filled 2023-01-18: qty 1

## 2023-01-18 MED ORDER — CELECOXIB 200 MG PO CAPS: 200.0000 mg | ORAL_CAPSULE | Freq: Two times a day (BID) | ORAL | 1 refills | Status: DC

## 2023-01-18 NOTE — Discharge Summary (Signed)
Physician Discharge Summary  Subjective: 1 Day Post-Op Procedure(s) (LRB): COMPUTER ASSISTED TOTAL KNEE ARTHROPLASTY (Left) Patient reports pain as mild.   Patient seen in rounds with Dr. Marry Guan. Patient is well, and has had no acute complaints or problems Patient is ready to go home with home health physical therapy  Physician Discharge Summary  Patient ID: Sherri Peters MRN: [id number] DOB/AGE: Aug 14, 1946 77 y.o.  Admit date: 01/17/2023 Discharge date: 01/18/2023  Admission Diagnoses:  Discharge Diagnoses:  Principal Problem:   Total knee replacement status   Discharged Condition: good  Hospital Course: The patient is postop day 1 from a left total knee arthroplasty.  Her vitals are stable.  She has ambulated to the bathroom twice during the night.  Her pain is manageable.  The patient had her dressing changed and her Hemovac removed this morning.  The Hemovac did lose some suction last night after one of the tubes and attached.  Treatments: surgery:   Left total knee arthroplasty using computer-assisted navigation   SURGEON:  Marciano Sequin. M.D.   ANESTHESIA: spinal   ESTIMATED BLOOD LOSS: 50 mL   FLUIDS REPLACED: 1000 mL of crystalloid   TOURNIQUET TIME: 107 minutes   DRAINS: 2 medium Hemovac drains   SOFT TISSUE RELEASES: Anterior cruciate ligament, posterior cruciate ligament, deep medial collateral ligament, patellofemoral ligament, and posterolateral corner   IMPLANTS UTILIZED: DePuy Attune size 7 posterior stabilized femoral component (cemented), size 6 rotating platform tibial component (cemented), 35 mm medialized dome patella (cemented), and a 6 mm stabilized rotating platform polyethylene insert.  Discharge Exam: Blood pressure (!) 110/53, pulse 69, temperature 97.9 F (36.6 C), temperature source Tympanic, resp. rate 16, height '5\' 3"'$  (1.6 m), weight 83 kg, SpO2 94 %.   Disposition: Discharge disposition: 01-Home or Self Care        Allergies as  of 01/18/2023       Reactions   Duloxetine Diarrhea   N/V/D   Levaquin [levofloxacin] Other (See Comments)   hallucinations   Fluoxetine Other (See Comments)   Gabapentin Swelling   Meloxicam Swelling   Tizanidine Other (See Comments)        Medication List     STOP taking these medications    aspirin EC 81 MG tablet       TAKE these medications    acetaminophen 650 MG CR tablet Commonly known as: TYLENOL Take 650 mg by mouth every 8 (eight) hours as needed for pain.   albuterol 108 (90 Base) MCG/ACT inhaler Commonly known as: VENTOLIN HFA Inhale 2 puffs into the lungs every 6 (six) hours as needed for wheezing or shortness of breath.   atorvastatin 40 MG tablet Commonly known as: LIPITOR TAKE 1 TABLET BY MOUTH  DAILY   azelastine 0.1 % nasal spray Commonly known as: ASTELIN Place 1 spray into both nostrils 2 (two) times a day.   b complex vitamins capsule Take 1 capsule by mouth daily.   B-complex with vitamin C tablet Take 1 tablet by mouth daily.   buPROPion 150 MG 24 hr tablet Commonly known as: WELLBUTRIN XL Take 150 mg by mouth daily.   busPIRone 5 MG tablet Commonly known as: BUSPAR Take by mouth daily.   celecoxib 200 MG capsule Commonly known as: CELEBREX Take 1 capsule (200 mg total) by mouth 2 (two) times daily.   citalopram 40 MG tablet Commonly known as: CELEXA TAKE 1 TABLET BY MOUTH  DAILY   enoxaparin 40 MG/0.4ML injection Commonly known as: LOVENOX Inject  0.4 mLs (40 mg total) into the skin daily for 14 days.   levothyroxine 75 MCG tablet Commonly known as: SYNTHROID TAKE 1 TABLET BY MOUTH  DAILY What changed: when to take this   levothyroxine 75 MCG tablet Commonly known as: SYNTHROID Take by mouth. What changed: Another medication with the same name was changed. Make sure you understand how and when to take each.   metFORMIN 500 MG tablet Commonly known as: GLUCOPHAGE Take 500 mg by mouth 2 (two) times daily with a  meal.   omeprazole 20 MG capsule Commonly known as: PRILOSEC TAKE 1 CAPSULE BY MOUTH  DAILY   omeprazole 20 MG capsule Commonly known as: PRILOSEC Take by mouth.   ondansetron 4 MG disintegrating tablet Commonly known as: Zofran ODT Take 1 tablet (4 mg total) by mouth every 8 (eight) hours as needed.   ondansetron 4 MG tablet Commonly known as: Zofran Take 1 tablet (4 mg total) by mouth every 8 (eight) hours as needed for vomiting or nausea.   oxyCODONE 5 MG immediate release tablet Commonly known as: Oxy IR/ROXICODONE Take 1 tablet (5 mg total) by mouth every 4 (four) hours as needed for moderate pain (pain score 4-6).   pregabalin 100 MG capsule Commonly known as: LYRICA Take 1 capsule (100 mg total) by mouth 3 (three) times daily.   pregabalin 100 MG capsule Commonly known as: LYRICA Take 1 capsule by mouth 3 (three) times daily.   telmisartan-hydrochlorothiazide 80-12.5 MG tablet Commonly known as: MICARDIS HCT TAKE 1 TABLET BY MOUTH  DAILY   traMADol 50 MG tablet Commonly known as: ULTRAM Take 1-2 tablets (50-100 mg total) by mouth every 4 (four) hours as needed for moderate pain.   triamcinolone cream 0.1 % Commonly known as: KENALOG Apply 1 Application topically 2 (two) times daily.   Vitamin C 500 MG Caps Take 500 mg by mouth.   Vitamin D (Ergocalciferol) 50 MCG (2000 UT) Caps Take 50 mcg by mouth.   Vitamin D3 50 MCG (2000 UT) Caps Generic drug: Cholecalciferol Take 2,000 Units by mouth daily.               Durable Medical Equipment  (From admission, onward)           Start     Ordered   01/17/23 1659  DME Walker rolling  Once       Question:  Patient needs a walker to treat with the following condition  Answer:  Total knee replacement status   01/17/23 1658   01/17/23 1659  DME Bedside commode  Once       Comments: Patient is not able to walk the distance required to go the bathroom, or he/she is unable to safely negotiate stairs  required to access the bathroom.  A 3in1 BSC will alleviate this problem  Question:  Patient needs a bedside commode to treat with the following condition  Answer:  Total knee replacement status   01/17/23 1658            Follow-up Information     Watt Climes, PA Follow up on 01/31/2023.   Specialty: Physician Assistant Why: at 10:45am Contact information: [place] [address] [phone]         Dereck Leep, MD Follow up on 03/01/2023.   Specialty: Orthopedic Surgery Why: at 2:15pm Contact information: [place] [address] [phone]  SignedPrescott Parma, Dimetri Armitage 01/18/2023, 7:05 AM   Objective: Vital signs in last 24 hours: Temp:  [97.5 F (36.4 C)-98.7 F (37.1 C)] 97.9 F (36.6 C) (03/14 0550) Pulse Rate:  [69-87] 69 (03/14 0550) Resp:  [11-18] 16 (03/14 0550) BP: (110-173)/(53-83) 110/53 (03/14 0550) SpO2:  [92 %-97 %] 94 % (03/14 0550) Weight:  [83 kg] 83 kg (03/13 0948)  Intake/Output from previous day:  Intake/Output Summary (Last 24 hours) at 01/18/2023 0705 Last data filed at 01/18/2023 0600 Gross per 24 hour  Intake 1568.83 ml  Output 930 ml  Net 638.83 ml    Intake/Output this shift: No intake/output data recorded.  Labs: No results for input(s): "HGB" in the last 72 hours. No results for input(s): "WBC", "RBC", "HCT", "PLT" in the last 72 hours. No results for input(s): "NA", "K", "CL", "CO2", "BUN", "CREATININE", "GLUCOSE", "CALCIUM" in the last 72 hours. No results for input(s): "LABPT", "INR" in the last 72 hours.  EXAM: General - Patient is Alert and Oriented Extremity - Neurovascular intact Sensation intact distally Dorsiflexion/Plantar flexion intact Compartment soft Incision - clean, dry, with the Hemovac tubing showing signs that one of the tubes came unattached.  There was mild drainage on the bandage.  The bandage was removed.  The Hemovac  tubes were removed with no complication. Motor Function -dorsiflexion and plantarflexion are intact.  Able to straight leg raise independently.  Assessment/Plan: 1 Day Post-Op Procedure(s) (LRB): COMPUTER ASSISTED TOTAL KNEE ARTHROPLASTY (Left) Procedure(s) (LRB): COMPUTER ASSISTED TOTAL KNEE ARTHROPLASTY (Left) Past Medical History:  Diagnosis Date   Aortic atherosclerosis (HCC)    Arthritis    Bilateral carotid artery disease (HCC)    Carpal tunnel syndrome of left wrist    CKD (chronic kidney disease), stage II    COPD (chronic obstructive pulmonary disease) (HCC)    Coronary artery disease    Depression    Diastolic dysfunction    a.) TTE 11/15/2018: EF >55%, mild LVH, MAC, BAE, triv PR, mild MR/TR, G1DD; b.) TTE 09/12/2021: EF >55%, mild LVH, MAC, triv TR/PR, mod MR, G1DD   Endophthalmitis, right    Fibromyalgia    GERD (gastroesophageal reflux disease)    History of bilateral cataract extraction 06/2018   Hyperlipidemia    Hypertension    Hypothyroidism    OSA on CPAP    Pre-diabetes    RLS (restless legs syndrome)    Statin intolerance    Umbilical hernia    a.) s/p repair 11/10/2016   Principal Problem:   Total knee replacement status  Estimated body mass index is 32.42 kg/m as calculated from the following:   Height as of this encounter: '5\' 3"'$  (1.6 m).   Weight as of this encounter: 83 kg. Advance diet Up with therapy D/C IV fluids Discharge home with home health Diet - Regular diet Follow up - in 2 weeks Activity - WBAT Disposition - Home Condition Upon Discharge - Stable DVT Prophylaxis - Lovenox and TED hose  Reche Dixon, PA-C Orthopaedic Surgery 01/18/2023, 7:05 AM

## 2023-01-18 NOTE — Plan of Care (Signed)
Patient met criteria for discharge home.

## 2023-01-18 NOTE — Anesthesia Postprocedure Evaluation (Signed)
Anesthesia Post Note  Patient: Sherri Peters  Procedure(s) Performed: COMPUTER ASSISTED TOTAL KNEE ARTHROPLASTY (Left: Knee)  Patient location during evaluation: Nursing Unit Anesthesia Type: Spinal Level of consciousness: awake, awake and alert and oriented Pain management: pain level controlled Vital Signs Assessment: post-procedure vital signs reviewed and stable Respiratory status: nonlabored ventilation, spontaneous breathing and respiratory function stable Cardiovascular status: blood pressure returned to baseline and stable Postop Assessment: no headache and no backache Anesthetic complications: no   No notable events documented.   Last Vitals:  Vitals:   01/17/23 2304 01/18/23 0550  BP: 130/61 (!) 110/53  Pulse: 83 69  Resp: 18 16  Temp: 36.8 C 36.6 C  SpO2: 93% 94%    Last Pain:  Vitals:   01/18/23 0550  TempSrc: Tympanic  PainSc: 3                  Proofreader

## 2023-01-18 NOTE — Progress Notes (Signed)
Pt asleep vital signs not taken at this time will take shortly. Pt did not sleep well and did not want to wake her. CPAP put on by pt pripr to falling asleep

## 2023-01-18 NOTE — Progress Notes (Signed)
Patient is not able to walk the distance required to go the bathroom, or he/she is unable to safely negotiate stairs required to access the bathroom.  A 3in1 BSC will alleviate this problem   Sherri Peters P. Sherri Peters, Jr. M.D.  

## 2023-01-18 NOTE — Evaluation (Signed)
Occupational Therapy Evaluation Patient Details Name: Sherri Peters MRN: [id number] DOB: 23-Aug-1946 Today's Date: 01/18/2023   History of Present Illness Pt is a 77 y.o. female s/p L TKA secondary to degenerative arthrosis.  PMH includes CAD, CTS L wrist, CKD, COPD, depression, fibromyalgia, htn, OSA on CPAP, RLS, umbilical hernia s/p repair 2018.   Clinical Impression   Pt seen for OT evaluation this date, Pt is alert and oriented x4, agreeable to OT evaluation with sister present throughout. PTA pt is MOD I-I in ADL/IADL; Pt provided education re:  polar care mgt, falls prevention strategies, home/routines modifications, DME/AE for LB bathing and dressing tasks, and compression stocking mgt via demo and hand out. Good carry over noted. Pt would benefit from skilled OT services including additional instruction in dressing techniques with or without assistive devices for dressing and bathing skills to support recall and carryover prior to discharge and ultimately to maximize safety, independence, and minimize falls risk and caregiver burden. Do not currently anticipate any OT needs following this hospitalization.        Recommendations for follow up therapy are one component of a multi-disciplinary discharge planning process, led by the attending physician.  Recommendations Sherrod be updated based on patient status, additional functional criteria and insurance authorization.   Follow Up Recommendations  No OT follow up     Assistance Recommended at Discharge Set up Supervision/Assistance  Patient can return home with the following A little help with bathing/dressing/bathroom;Assistance with cooking/housework;Direct supervision/assist for medications management;Help with stairs or ramp for entrance    Functional Status Assessment  Patient has had a recent decline in their functional status and demonstrates the ability to make significant improvements in function in a reasonable and predictable amount  of time.  Equipment Recommendations  BSC/3in1;Tub/shower seat    Recommendations for Other Services       Precautions / Restrictions Precautions Precautions: Knee;Fall Precaution Booklet Issued: Yes (comment) Restrictions Weight Bearing Restrictions: Yes LLE Weight Bearing: Weight bearing as tolerated      Mobility Bed Mobility               General bed mobility comments: NT in recliner pre/post session    Transfers Overall transfer level: Needs assistance Equipment used: Rolling walker (2 wheels) Transfers: Sit to/from Stand Sit to Stand: Supervision                  Balance Overall balance assessment: Needs assistance Sitting-balance support: No upper extremity supported, Feet supported Sitting balance-Leahy Scale: Normal     Standing balance support: Bilateral upper extremity supported, During functional activity, Reliant on assistive device for balance Standing balance-Leahy Scale: Good                             ADL either performed or assessed with clinical judgement   ADL Overall ADL's : Needs assistance/impaired Eating/Feeding: Set up;Sitting   Grooming: Wash/dry hands;Sitting;Set up           Upper Body Dressing : Set up;Sitting   Lower Body Dressing: Min guard;Sit to/from stand Lower Body Dressing Details (indicate cue type and reason): pants, underwear Toilet Transfer: Supervision/safety;Rolling walker (2 wheels);BSC/3in1 Toilet Transfer Details (indicate cue type and reason): intermittent vcs for safety Toileting- Clothing Manipulation and Hygiene: Supervision/safety;Sit to/from stand Toileting - Clothing Manipulation Details (indicate cue type and reason): with RW, intermittent vcs for technique     Functional mobility during ADLs: Supervision/safety;Rolling walker (2 wheels) (approx 30' 2  attempts)       Vision Patient Visual Report: No change from baseline       Perception     Praxis      Pertinent  Vitals/Pain Pain Assessment Pain Score: 5  Pain Location: L knee Pain Descriptors / Indicators: Aching, Sore Pain Intervention(s): Limited activity within patient's tolerance, Monitored during session, Premedicated before session, Repositioned, Ice applied, Patient requesting pain meds-RN notified     Hand Dominance     Extremity/Trunk Assessment Upper Extremity Assessment Upper Extremity Assessment: Overall WFL for tasks assessed   Lower Extremity Assessment Lower Extremity Assessment: LLE deficits/detail LLE Deficits / Details: s/p L TKA LLE: Unable to fully assess due to pain   Cervical / Trunk Assessment Cervical / Trunk Assessment: Normal   Communication Communication Communication: No difficulties   Cognition Arousal/Alertness: Awake/alert Behavior During Therapy: WFL for tasks assessed/performed Overall Cognitive Status: Within Functional Limits for tasks assessed                                       General Comments  mild drainage noted L knee honeycomb dressings    Exercises     Shoulder Instructions      Home Living Family/patient expects to be discharged to:: Private residence Living Arrangements: Alone Available Help at Discharge: Family;Available 24 hours/day;Available PRN/intermittently Type of Home: Mobile home Home Access: Stairs to enter Entrance Stairs-Number of Steps: 3-4 Entrance Stairs-Rails: Right;Left;Can reach both Home Layout: One level     Bathroom Shower/Tub: Tub/shower unit;Walk-in shower   Bathroom Toilet: Handicapped height     Home Equipment: Conservation officer, nature (2 wheels)          Prior Functioning/Environment Prior Level of Function : Independent/Modified Independent             Mobility Comments: PRN use of RW ADLs Comments: MOD I-I in ADL/IADL        OT Problem List: Decreased knowledge of use of DME or AE;Impaired balance (sitting and/or standing)      OT Treatment/Interventions: Self-care/ADL  training;Balance training;Therapeutic exercise;Therapeutic activities;DME and/or AE instruction;Patient/family education    OT Goals(Current goals can be found in the care plan section) Acute Rehab OT Goals Patient Stated Goal: return home OT Goal Formulation: With patient Time For Goal Achievement: 02/01/23 Potential to Achieve Goals: Good ADL Goals Pt Will Perform Grooming: with modified independence Pt Will Perform Lower Body Dressing: with modified independence;sit to/from stand Pt Will Transfer to Toilet: with modified independence;ambulating Pt Will Perform Toileting - Clothing Manipulation and hygiene: with modified independence;sit to/from stand  OT Frequency: Min 2X/week    Co-evaluation              AM-PAC OT "6 Clicks" Daily Activity     Outcome Measure Help from another person eating meals?: None Help from another person taking care of personal grooming?: None Help from another person toileting, which includes using toliet, bedpan, or urinal?: None Help from another person bathing (including washing, rinsing, drying)?: A Little Help from another person to put on and taking off regular upper body clothing?: None Help from another person to put on and taking off regular lower body clothing?: A Little 6 Click Score: 22   End of Session Equipment Utilized During Treatment: Rolling walker (2 wheels);Gait belt Nurse Communication: Mobility status  Activity Tolerance: Patient tolerated treatment well Patient left: in chair;with call bell/phone within reach;with family/visitor present;with nursing/sitter in  room  OT Visit Diagnosis: Unsteadiness on feet (R26.81);Repeated falls (R29.6)                Time: [id number] OT Time Calculation (min): 15 min Charges:  OT General Charges $OT Visit: 1 Visit OT Evaluation $OT Eval Low Complexity: 1 Low  Shanon Payor, OTD OTR/L  01/18/23, 11:05 AM

## 2023-01-18 NOTE — Evaluation (Signed)
Physical Therapy Evaluation Patient Details Name: Sherri Peters MRN: [id number] DOB: July 27, 1946 Today's Date: 01/18/2023  History of Present Illness  Pt is a 77 y.o. female s/p L TKA secondary to degenerative arthrosis.  PMH includes CAD, CTS L wrist, CKD, COPD, depression, fibromyalgia, htn, OSA on CPAP, RLS, umbilical hernia s/p repair 2018.  Clinical Impression  Prior to surgery, pt was independent with ambulation (occasional use of RW; no recent falls); lives alone in 1 level home with 3-4 STE with railings; pt's sister lives down the street from pt but plans to stay with pt initially to assist as needed (pt's daughter is also coming to stay to assist over the weekend).  Able to perform L LE SLR independently; L knee AROM 0-95 degrees; 6-7/10 L knee pain during session (nurse notified of pt's pain).  Currently pt is SBA supine to sitting edge of bed; SBA with transfers using RW; CGA progressing to SBA ambulating 160 feet with RW use; and CGA navigating 4 steps with B UE support on railing.  Reviewed home safety and safe car transfers: pt and pt's sister appearing with appropriate understanding.  Pt performed supine and sitting LE ex's per TKA HEP (pt issued written handout).  Pt would currently benefit from skilled PT to address noted impairments and functional limitations (see below for any additional details).  Upon hospital discharge, pt would benefit from ongoing therapy.  MD, PA, nurse, and TOC notified pt appears ready to discharge home today from PT standpoint; TOC working on getting pt 3 in 1/BSC (nurse aware).    Recommendations for follow up therapy are one component of a multi-disciplinary discharge planning process, led by the attending physician.  Recommendations Dacy be updated based on patient status, additional functional criteria and insurance authorization.  Follow Up Recommendations Home health PT      Assistance Recommended at Discharge Intermittent Supervision/Assistance  Patient  can return home with the following  A little help with walking and/or transfers;A little help with bathing/dressing/bathroom;Assistance with cooking/housework;Assist for transportation;Help with stairs or ramp for entrance    Equipment Recommendations BSC/3in1 (pt has RW at home already)  Recommendations for Other Services       Functional Status Assessment Patient has had a recent decline in their functional status and demonstrates the ability to make significant improvements in function in a reasonable and predictable amount of time.     Precautions / Restrictions Precautions Precautions: Knee;Fall Precaution Booklet Issued: Yes (comment) Restrictions Weight Bearing Restrictions: Yes LLE Weight Bearing: Weight bearing as tolerated      Mobility  Bed Mobility Overal bed mobility: Needs Assistance Bed Mobility: Supine to Sit     Supine to sit: Supervision     General bed mobility comments: bed flat; no difficulties noted    Transfers Overall transfer level: Needs assistance Equipment used: Rolling walker (2 wheels) Transfers: Sit to/from Stand Sit to Stand: Supervision           General transfer comment: occasional vc's for UE placement    Ambulation/Gait Ambulation/Gait assistance: Min guard, Supervision Gait Distance (Feet): 160 Feet Assistive device: Rolling walker (2 wheels)   Gait velocity: mildly decreased     General Gait Details: antalgic; decreased stance time L LE; partial step through gait pattern; steady with RW use  Stairs Stairs: Yes Stairs assistance: Min guard Stair Management: One rail Left, Step to pattern, Sideways Number of Stairs: 4 General stair comments: initial vc's for LE sequencing/overall technique; steady  Wheelchair Mobility    Modified  Rankin (Stroke Patients Only)       Balance Overall balance assessment: Needs assistance Sitting-balance support: No upper extremity supported, Feet supported Sitting balance-Leahy  Scale: Normal Sitting balance - Comments: steady sitting reaching outside BOS   Standing balance support: Bilateral upper extremity supported, During functional activity, Reliant on assistive device for balance Standing balance-Leahy Scale: Good Standing balance comment: steady ambulating with RW use                             Pertinent Vitals/Pain Pain Assessment Pain Assessment: 0-10 Pain Score: 7  Pain Location: L knee Pain Descriptors / Indicators: Aching, Sore Pain Intervention(s): Limited activity within patient's tolerance, Monitored during session, Premedicated before session, Repositioned, Other (comment) (RN notified of pt's pain; OT to place polar care when done with OT session) Vitals (HR and O2 on room air) stable and WFL throughout treatment session.    Home Living Family/patient expects to be discharged to:: Private residence Living Arrangements: Alone Available Help at Discharge: Family;Available 24 hours/day;Available PRN/intermittently Type of Home: Mobile home Home Access: Stairs to enter Entrance Stairs-Rails: Right;Left;Can reach both Entrance Stairs-Number of Steps: 3-4   Home Layout: One level Home Equipment: Conservation officer, nature (2 wheels)      Prior Function Prior Level of Function : Independent/Modified Independent             Mobility Comments: Occasional use of RW; no recent falls. ADLs Comments: Independent     Hand Dominance        Extremity/Trunk Assessment   Upper Extremity Assessment Upper Extremity Assessment: Overall WFL for tasks assessed    Lower Extremity Assessment Lower Extremity Assessment: LLE deficits/detail (R LE WFL) LLE Deficits / Details: able to perform L LE SLR independently; at least 3/5 AROM hip flexion and ankle DF/PF LLE: Unable to fully assess due to pain    Cervical / Trunk Assessment Cervical / Trunk Assessment: Normal  Communication   Communication: No difficulties  Cognition  Arousal/Alertness: Awake/alert Behavior During Therapy: WFL for tasks assessed/performed Overall Cognitive Status: Within Functional Limits for tasks assessed                                          General Comments General comments (skin integrity, edema, etc.): mild drainage noted L knee honeycomb dressings.  Nursing cleared pt for participation in physical therapy.  Pt agreeable to PT session.  Pt's sister present during session.    Exercises Total Joint Exercises Ankle Circles/Pumps: AROM, Strengthening, Both, 10 reps, Supine Quad Sets: AROM, Strengthening, Both, 10 reps, Supine Short Arc Quad: AROM, Strengthening, Left, 10 reps, Supine Heel Slides: AROM, Strengthening, Left, 10 reps, Supine Hip ABduction/ADduction: AROM, Strengthening, Left, 10 reps, Supine Straight Leg Raises: AROM, Strengthening, Left, 10 reps, Supine Goniometric ROM: L knee AROM 0-95 degrees   Assessment/Plan    PT Assessment Patient needs continued PT services  PT Problem List Decreased strength;Decreased range of motion;Decreased activity tolerance;Decreased balance;Decreased mobility;Decreased knowledge of use of DME;Decreased knowledge of precautions;Decreased skin integrity;Pain       PT Treatment Interventions DME instruction;Gait training;Stair training;Functional mobility training;Therapeutic activities;Therapeutic exercise;Balance training;Patient/family education    PT Goals (Current goals can be found in the Care Plan section)  Acute Rehab PT Goals Patient Stated Goal: to improve pain and walking PT Goal Formulation: With patient Time For Goal Achievement: 02/01/23 Potential  to Achieve Goals: Good    Frequency BID     Co-evaluation               AM-PAC PT "6 Clicks" Mobility  Outcome Measure Help needed turning from your back to your side while in a flat bed without using bedrails?: None Help needed moving from lying on your back to sitting on the side of a flat  bed without using bedrails?: A Little Help needed moving to and from a bed to a chair (including a wheelchair)?: A Little Help needed standing up from a chair using your arms (e.g., wheelchair or bedside chair)?: A Little Help needed to walk in hospital room?: A Little Help needed climbing 3-5 steps with a railing? : A Little 6 Click Score: 19    End of Session Equipment Utilized During Treatment: Gait belt Activity Tolerance: Patient tolerated treatment well Patient left: in chair;Other (comment);with family/visitor present (with OT present for OT evaluation) Nurse Communication: Mobility status;Precautions;Weight bearing status;Other (comment) (Pt's pain status and needing 3 in 1/BSC) PT Visit Diagnosis: Other abnormalities of gait and mobility (R26.89);Muscle weakness (generalized) (M62.81);Pain Pain - Right/Left: Left Pain - part of body: Knee    Time: [id number] PT Time Calculation (min) (ACUTE ONLY): 36 min   Charges:   PT Evaluation $PT Eval Low Complexity: 1 Low PT Treatments $Gait Training: 8-22 mins $Therapeutic Exercise: 8-22 mins       Leitha Bleak, PT 01/18/23, 10:46 AM

## 2023-01-18 NOTE — TOC Progression Note (Signed)
Transition of Care [facility]) - Progression Note    Patient Details  Name: Sherri Peters MRN: [id number] Date of Birth: 1946-04-09  [facility] CM/SW [place], RN Phone Number: 01/18/2023, 10:02 AM  Clinical Narrative:     The patient is set up with [facility] for [facility] services       Expected Discharge Plan and Services         Expected Discharge Date: 01/18/23                                     Social Determinants of Health (SDOH) Interventions SDOH Screenings   Food Insecurity: No Food Insecurity (01/17/2023)  Housing: Low Risk  (01/17/2023)  Transportation Needs: No Transportation Needs (01/17/2023)  Utilities: Not At Risk (01/17/2023)  Tobacco Use: High Risk (01/17/2023)    Readmission Risk Interventions     No data to display

## 2023-01-18 NOTE — Progress Notes (Signed)
  Subjective: 1 Day Post-Op Procedure(s) (LRB): COMPUTER ASSISTED TOTAL KNEE ARTHROPLASTY (Left) Patient reports pain as mild.   Patient is well, and has had no acute complaints or problems Plan is to go Home after hospital stay. Negative for chest pain and shortness of breath Fever: no Gastrointestinal: Negative for nausea and vomiting  Objective: Vital signs in last 24 hours: Temp:  [97.5 F (36.4 C)-98.7 F (37.1 C)] 97.9 F (36.6 C) (03/14 0550) Pulse Rate:  [69-87] 69 (03/14 0550) Resp:  [11-18] 16 (03/14 0550) BP: (110-173)/(53-83) 110/53 (03/14 0550) SpO2:  [92 %-97 %] 94 % (03/14 0550) Weight:  [83 kg] 83 kg (03/13 0948)  Intake/Output from previous day:  Intake/Output Summary (Last 24 hours) at 01/18/2023 0701 Last data filed at 01/18/2023 0600 Gross per 24 hour  Intake 1568.83 ml  Output 930 ml  Net 638.83 ml    Intake/Output this shift: No intake/output data recorded.  Labs: No results for input(s): "HGB" in the last 72 hours. No results for input(s): "WBC", "RBC", "HCT", "PLT" in the last 72 hours. No results for input(s): "NA", "K", "CL", "CO2", "BUN", "CREATININE", "GLUCOSE", "CALCIUM" in the last 72 hours. No results for input(s): "LABPT", "INR" in the last 72 hours.   EXAM General - Patient is Alert and Oriented Extremity - Neurovascular intact Sensation intact distally Dorsiflexion/Plantar flexion intact Compartment soft Dressing/Incision - clean, dry, with one of the Hemovac tubes that had come unattached under the bandage.  The Hemovac tubes were removed with no complication.  The Hemovac tubing was intact on removal.  The surgical bandage was removed. Motor Function - intact, moving foot and toes well on exam.  Able to straight leg raise independently  Past Medical History:  Diagnosis Date   Aortic atherosclerosis ([place])    Arthritis    Bilateral carotid artery disease ([facility])    Carpal tunnel syndrome of left wrist    CKD (chronic kidney  disease), stage II    COPD (chronic obstructive pulmonary disease) (HCC)    Coronary artery disease    Depression    Diastolic dysfunction    a.) TTE 11/15/2018: EF >55%, mild LVH, MAC, BAE, triv PR, mild MR/TR, G1DD; b.) TTE 09/12/2021: EF >55%, mild LVH, MAC, triv TR/PR, mod MR, G1DD   Endophthalmitis, right    Fibromyalgia    GERD (gastroesophageal reflux disease)    History of bilateral cataract extraction 06/2018   Hyperlipidemia    Hypertension    Hypothyroidism    OSA on CPAP    Pre-diabetes    RLS (restless legs syndrome)    Statin intolerance    Umbilical hernia    a.) s/p repair 11/10/2016    Assessment/Plan: 1 Day Post-Op Procedure(s) (LRB): COMPUTER ASSISTED TOTAL KNEE ARTHROPLASTY (Left) Principal Problem:   Total knee replacement status  Estimated body mass index is 32.42 kg/m as calculated from the following:   Height as of this encounter: 5\' 3"  (1.6 m).   Weight as of this encounter: 83 kg. Advance diet Up with therapy D/C IV fluids Discharge home with home health  DVT Prophylaxis - Lovenox, Foot Pumps, and TED hose Weight-Bearing as tolerated to left leg  Reche Dixon, PA-C Orthopaedic Surgery 01/18/2023, 7:01 AM

## 2023-02-05 ENCOUNTER — Ambulatory Visit (INDEPENDENT_AMBULATORY_CARE_PROVIDER_SITE_OTHER): Payer: Medicare PPO | Admitting: Internal Medicine

## 2023-02-05 VITALS — BP 120/59 | HR 86 | Resp 12 | Ht 63.0 in | Wt 177.5 lb

## 2023-02-05 DIAGNOSIS — I10 Essential (primary) hypertension: Secondary | ICD-10-CM | POA: Diagnosis not present

## 2023-02-05 DIAGNOSIS — G2581 Restless legs syndrome: Secondary | ICD-10-CM | POA: Diagnosis not present

## 2023-02-05 DIAGNOSIS — G4733 Obstructive sleep apnea (adult) (pediatric): Secondary | ICD-10-CM | POA: Diagnosis not present

## 2023-02-05 DIAGNOSIS — Z7189 Other specified counseling: Secondary | ICD-10-CM

## 2023-02-05 NOTE — Progress Notes (Unsigned)
[facility] [place], [place] [address]  Pulmonary Sleep Medicine   Office Visit Note  Patient Name: Sherri Peters DOB: 12-11-1945 MRN [id number]    Chief Complaint: Obstructive Sleep Apnea visit  Brief History:  Sherri Peters is seen today for a follow up visit for CPAP@ 9 cmH2O. The patient has a 8 year history of sleep apnea. Patient is using PAP nightly.  The patient feels does not feel rested after sleeping with PAP.  The patient reports benefit  from PAP use. Reported sleepiness is  somewhat improved and the Epworth Sleepiness Score is 10 out of 24. The patient does not take naps. The patient complains of the following: RLS  which can keep her up all night. Some oral dryness also bothers her.  We discussed increasing humidity to combat the dryness. The compliance download shows 92% compliance with an average use time of 7 hours 6 minutes. The AHI is 22.7.  The patient does complain of limb movements disrupting sleep. The patient continues to require PAP therapy in order to eliminate her sleep apnea.   ROS  General: (-) fever, (-) chills, (-) night sweat Nose and Sinuses: (-) nasal stuffiness or itchiness, (-) postnasal drip, (-) nosebleeds, (-) sinus trouble. Mouth and Throat: (-) sore throat, (-) hoarseness. Neck: (-) swollen glands, (-) enlarged thyroid, (-) neck pain. Respiratory: - cough, - shortness of breath, - wheezing. Neurologic: - numbness, - tingling. Psychiatric: + anxiety, +depression   Current Medication: Outpatient Encounter Medications as of 02/05/2023  Medication Sig   acetaminophen (TYLENOL) 650 MG CR tablet Take 650 mg by mouth every 8 (eight) hours as needed for pain.   albuterol (VENTOLIN HFA) 108 (90 Base) MCG/ACT inhaler Inhale 2 puffs into the lungs every 6 (six) hours as needed for wheezing or shortness of breath. (Patient not taking: Reported on 01/17/2023)   Ascorbic Acid (VITAMIN C) 500 MG CAPS Take 500 mg by mouth. (Patient not  taking: Reported on 01/17/2023)   atorvastatin (LIPITOR) 40 MG tablet TAKE 1 TABLET BY MOUTH  DAILY (Patient taking differently: Take 40 mg by mouth daily.)   azelastine (ASTELIN) 0.1 % nasal spray Place 1 spray into both nostrils 2 (two) times a day.   b complex vitamins capsule Take 1 capsule by mouth daily.   B Complex-C (B-COMPLEX WITH VITAMIN C) tablet Take 1 tablet by mouth daily.   buPROPion (WELLBUTRIN XL) 150 MG 24 hr tablet Take 150 mg by mouth daily.   busPIRone (BUSPAR) 5 MG tablet Take by mouth daily.   celecoxib (CELEBREX) 200 MG capsule Take 1 capsule (200 mg total) by mouth 2 (two) times daily.   Cholecalciferol (VITAMIN D3) 50 MCG (2000 UT) capsule Take 2,000 Units by mouth daily.   citalopram (CELEXA) 40 MG tablet TAKE 1 TABLET BY MOUTH  DAILY (Patient taking differently: Take 40 mg by mouth daily.)   enoxaparin (LOVENOX) 40 MG/0.4ML injection Inject 0.4 mLs (40 mg total) into the skin daily for 14 days.   levothyroxine (SYNTHROID, LEVOTHROID) 75 MCG tablet TAKE 1 TABLET BY MOUTH  DAILY (Patient taking differently: Take 75 mcg by mouth daily before breakfast.)   metFORMIN (GLUCOPHAGE) 500 MG tablet Take 500 mg by mouth 2 (two) times daily with a meal.   omeprazole (PRILOSEC) 20 MG capsule TAKE 1 CAPSULE BY MOUTH  DAILY (Patient taking differently: Take 20 mg by mouth daily.)   ondansetron (ZOFRAN ODT) 4 MG disintegrating tablet Take 1 tablet (4 mg total) by mouth every 8 (  eight) hours as needed. (Patient not taking: Reported on 01/17/2023)   ondansetron (ZOFRAN) 4 MG tablet Take 1 tablet (4 mg total) by mouth every 8 (eight) hours as needed for vomiting or nausea. (Patient not taking: Reported on 01/17/2023)   oxyCODONE (OXY IR/ROXICODONE) 5 MG immediate release tablet Take 1 tablet (5 mg total) by mouth every 4 (four) hours as needed for moderate pain (pain score 4-6).   pregabalin (LYRICA) 100 MG capsule Take 1 capsule by mouth 3 (three) times daily.    telmisartan-hydrochlorothiazide (MICARDIS HCT) 80-12.5 MG tablet TAKE 1 TABLET BY MOUTH  DAILY   traMADol (ULTRAM) 50 MG tablet Take 1-2 tablets (50-100 mg total) by mouth every 4 (four) hours as needed for moderate pain.   triamcinolone cream (KENALOG) 0.1 % Apply 1 Application topically 2 (two) times daily. (Patient not taking: Reported on 01/17/2023)   Vitamin D, Ergocalciferol, 50 MCG (2000 UT) CAPS Take 50 mcg by mouth.   [DISCONTINUED] levothyroxine (SYNTHROID) 75 MCG tablet Take by mouth.   [DISCONTINUED] omeprazole (PRILOSEC) 20 MG capsule Take by mouth.   [DISCONTINUED] pregabalin (LYRICA) 100 MG capsule Take 1 capsule (100 mg total) by mouth 3 (three) times daily.   No facility-administered encounter medications on file as of 02/05/2023.    Surgical History: Past Surgical History:  Procedure Laterality Date   ABDOMINAL HYSTERECTOMY     APPENDECTOMY N/A    CATARACT EXTRACTION Bilateral 06/2018   CHOLECYSTECTOMY N/A 12/08/2019   COLONOSCOPY     KNEE ARTHROPLASTY Left 01/17/2023   Procedure: COMPUTER ASSISTED TOTAL KNEE ARTHROPLASTY;  Surgeon: Dereck Leep, MD;  Location: [facility];  Service: Orthopedics;  Laterality: Left;   TONSILLECTOMY Bilateral    TOTAL KNEE ARTHROPLASTY Right 06/03/2020   ARTHROPLASTY, KNEE, CONDYLE AND PLATEAU; MEDIAL AND LATERAL COMPARTMENTSWITH OR WITHOUT PATELLA RESURFACING (TOTAL KNEE ARTHROPLASTY); Surgeon: Mardi Mainland, MD; Location: [place]; Service: Orthopedics; Laterality: Right;   UMBILICAL HERNIA REPAIR  11/10/2016   LAPAROSCOPY, SURGICAL; REPAIR, UMBILICAL HERNIA (INCLUDES MESH INSERTION, WHEN PERFORMED); REDUCIBLE - laparoscopic umbilical hernia repair. GETA; Surgeon: Nida Boatman, MD; Location: [place]; Service: General Surgery; Laterality: N/A    Medical History: Past Medical History:  Diagnosis Date   Aortic atherosclerosis    Arthritis    Bilateral carotid artery disease    Carpal tunnel  syndrome of left wrist    CKD (chronic kidney disease), stage II    COPD (chronic obstructive pulmonary disease)    Coronary artery disease    Depression    Diastolic dysfunction    a.) TTE 11/15/2018: EF >55%, mild LVH, MAC, BAE, triv PR, mild MR/TR, G1DD; b.) TTE 09/12/2021: EF >55%, mild LVH, MAC, triv TR/PR, mod MR, G1DD   Endophthalmitis, right    Fibromyalgia    GERD (gastroesophageal reflux disease)    History of bilateral cataract extraction 06/2018   Hyperlipidemia    Hypertension    Hypothyroidism    OSA on CPAP    Pre-diabetes    RLS (restless legs syndrome)    Statin intolerance    Umbilical hernia    a.) s/p repair 11/10/2016    Family History: Non contributory to the present illness  Social History: Social History   Socioeconomic History   Marital status: Divorced    Spouse name: Not on file   Number of children: Not on file   Years of education: Not on file   Highest education level: Not on file  Occupational History   Not on file  Tobacco Use   Smoking status: Every Day    Packs/day: 1    Types: Cigarettes    Last attempt to quit: 07/20/2016    Years since quitting: 6.5   Smokeless tobacco: Never  Vaping Use   Vaping Use: Never used  Substance and Sexual Activity   Alcohol use: No   Drug use: No   Sexual activity: Not on file  Other Topics Concern   Not on file  Social History Narrative   Not on file   Social Determinants of Health   Financial Resource Strain: Not on file  Food Insecurity: No Food Insecurity (01/17/2023)   Hunger Vital Sign    Worried About Running Out of Food in the Last Year: Never true    Ran Out of Food in the Last Year: Never true  Transportation Needs: No Transportation Needs (01/17/2023)   PRAPARE - Hydrologist (Medical): No    Lack of Transportation (Non-Medical): No  Physical Activity: Not on file  Stress: Not on file  Social Connections: Not on file  Intimate Partner Violence: Not  At Risk (01/17/2023)   Humiliation, Afraid, Rape, and Kick questionnaire    Fear of Current or Ex-Partner: No    Emotionally Abused: No    Physically Abused: No    Sexually Abused: No    Vital Signs: Blood pressure (!) 120/59, pulse 86, resp. rate 12, height 5\' 3"  (1.6 m), weight 177 lb 8 oz (80.5 kg), SpO2 96 %. Body mass index is 31.44 kg/m.    Examination: General Appearance: The patient is well-developed, well-nourished, and in no distress. Neck Circumference: 43cm Skin: Gross inspection of skin unremarkable. Head: normocephalic, no gross deformities. Eyes: no gross deformities noted. ENT: ears appear grossly normal Neurologic: Alert and oriented. No involuntary movements.  STOP BANG RISK ASSESSMENT S (snore) Have you been told that you snore?     No   T (tired) Are you often tired, fatigued, or sleepy during the day?   YES  O (obstruction) Do you stop breathing, choke, or gasp during sleep? NO   P (pressure) Do you have or are you being treated for high blood pressure? YES   B (BMI) Is your body index greater than 35 kg/m? NO   A (age) Are you 62 years old or older? NO   N (neck) Do you have a neck circumference greater than 16 inches?   YES   G (gender) Are you a female? NO   TOTAL STOP/BANG "YES" ANSWERS 4       A STOP-Bang score of 2 or less is considered low risk, and a score of 5 or more is high risk for having either moderate or severe OSA. For people who score 3 or 4, doctors Gasparini need to perform further assessment to determine how likely they are to have OSA.         EPWORTH SLEEPINESS SCALE:  Scale:  (0)= no chance of dozing; (1)= slight chance of dozing; (2)= moderate chance of dozing; (3)= high chance of dozing  Chance  Situtation    Sitting and reading: 3    Watching TV: 3    Sitting Inactive in public: 1    As a passenger in car: 2      Lying down to rest: 0    Sitting and talking: 0    Sitting quielty after lunch: 1    In a car,  stopped in traffic: 0   TOTAL SCORE:  10 out of 24    SLEEP STUDIES:  PSG (04/02/20) AHI 6.8/hr, REM AHI 30.4/hr,  min SpO2 84% Titration (02/2015) CPAP@ 7 cmH2O   CPAP COMPLIANCE DATA:  Date Range: 11/07/2022-02/04/2023  Average Daily Use: 7 hours 6 minutes  Median Use: 7 hours 17 minutes  Compliance for > 4 Hours: 92%  AHI: 22.7 respiratory events per hour  Days Used: 90/90 days  Mask Leak: 12.1  95th Percentile Pressure: 9         LABS: Recent Results (from the past 2160 hour(s))  Type and screen [place]     Status: None   Collection Time: 01/08/23  9:15 AM  Result Value Ref Range   ABO/RH(D) O POS    Antibody Screen NEG    Sample Expiration 01/22/2023,2359    Extend sample reason      NO TRANSFUSIONS OR PREGNANCY IN THE PAST 3 MONTHS Performed at [facility], [address] [place], [place] [address]   Surgical pcr screen     Status: None   Collection Time: 01/08/23  9:15 AM   Specimen: Nasal Mucosa; Nasal Swab  Result Value Ref Range   MRSA, PCR NEGATIVE NEGATIVE   Staphylococcus aureus NEGATIVE NEGATIVE    Comment: (NOTE) The Xpert SA Assay (FDA approved for NASAL specimens in patients 87 years of age and older), is one component of a comprehensive surveillance program. It is not intended to diagnose infection nor to guide or monitor treatment. Performed at [facility], [address] [place], [place] [address]   CBC     Status: None   Collection Time: 01/08/23  9:15 AM  Result Value Ref Range   WBC 9.1 4.0 - 10.5 K/uL   RBC 4.00 3.87 - 5.11 MIL/uL   Hemoglobin 12.0 12.0 - 15.0 g/dL   HCT 38.4 36.0 - 46.0 %   MCV 96.0 80.0 - 100.0 fL   MCH 30.0 26.0 - 34.0 pg   MCHC 31.3 30.0 - 36.0 g/dL   RDW 14.7 11.5 - 15.5 %   Platelets 265 150 - 400 K/uL   nRBC 0.0 0.0 - 0.2 %    Comment: Performed at [facility], [address] [place], [place] [address]  Comprehensive metabolic  panel     Status: Abnormal   Collection Time: 01/08/23  9:15 AM  Result Value Ref Range   Sodium 138 135 - 145 mmol/L   Potassium 3.7 3.5 - 5.1 mmol/L   Chloride 106 98 - 111 mmol/L   CO2 25 22 - 32 mmol/L   Glucose, Bld 99 70 - 99 mg/dL    Comment: Glucose reference range applies only to samples taken after fasting for at least 8 hours.   BUN 27 (H) 8 - 23 mg/dL   Creatinine, Ser 1.04 (H) 0.44 - 1.00 mg/dL   Calcium 9.5 8.9 - 10.3 mg/dL   Total Protein 6.2 (L) 6.5 - 8.1 g/dL   Albumin 3.8 3.5 - 5.0 g/dL   AST 21 15 - 41 U/L   ALT 18 0 - 44 U/L   Alkaline Phosphatase 72 38 - 126 U/L   Total Bilirubin 0.3 0.3 - 1.2 mg/dL   GFR, Estimated 56 (L) >60 mL/min    Comment: (NOTE) Calculated using the CKD-EPI Creatinine Equation (2021)    Anion gap 7 5 - 15    Comment: Performed at [facility], [address] [place], [place] [address]  Urinalysis, Routine w reflex microscopic -  Urine, Clean Catch     Status: Abnormal   Collection Time: 01/08/23  9:15 AM  Result Value Ref Range   Color, Urine AMBER (A) YELLOW    Comment: BIOCHEMICALS [place] BE AFFECTED BY COLOR   APPearance HAZY (A) CLEAR   Specific Gravity, Urine 1.020 1.005 - 1.030   pH 5.0 5.0 - 8.0   Glucose, UA NEGATIVE NEGATIVE mg/dL   Hgb urine dipstick NEGATIVE NEGATIVE   Bilirubin Urine NEGATIVE NEGATIVE   Ketones, ur NEGATIVE NEGATIVE mg/dL   Protein, ur NEGATIVE NEGATIVE mg/dL   Nitrite NEGATIVE NEGATIVE   Leukocytes,Ua SMALL (A) NEGATIVE   WBC, UA 6-10 0 - 5 WBC/hpf   Bacteria, UA NONE SEEN NONE SEEN   Squamous Epithelial / HPF 0-5 0 - 5 /HPF   Mucus PRESENT     Comment: Performed at [facility], [address] [place], [place] [address]  C-reactive protein     Status: Abnormal   Collection Time: 01/08/23  9:15 AM  Result Value Ref Range   CRP 1.7 (H) <1.0 mg/dL    Comment: Performed at [place] [address] [place], [place] [address]  Sedimentation rate     Status: None    Collection Time: 01/08/23  9:15 AM  Result Value Ref Range   Sed Rate 12 0 - 30 mm/hr    Comment: Performed at [facility], [address] [place], [place] [address]  Hemoglobin A1c     Status: Abnormal   Collection Time: 01/08/23  9:15 AM  Result Value Ref Range   Hgb A1c MFr Bld 5.8 (H) 4.8 - 5.6 %    Comment: (NOTE)         Prediabetes: 5.7 - 6.4         Diabetes: >6.4         Glycemic control for adults with diabetes: <7.0    Mean Plasma Glucose 120 mg/dL    Comment: (NOTE) Performed At: [facility] [place], [place] [id number] Rush Farmer MD [id number]   ABO/Rh     Status: None   Collection Time: 01/17/23  9:23 AM  Result Value Ref Range   ABO/RH(D)      O POS Performed at [facility], [address] [place], [place] [address]   Glucose, capillary     Status: None   Collection Time: 01/17/23  9:25 AM  Result Value Ref Range   Glucose-Capillary 87 70 - 99 mg/dL    Comment: Glucose reference range applies only to samples taken after fasting for at least 8 hours.  Glucose, capillary     Status: Abnormal   Collection Time: 01/17/23  3:46 PM  Result Value Ref Range   Glucose-Capillary 164 (H) 70 - 99 mg/dL    Comment: Glucose reference range applies only to samples taken after fasting for at least 8 hours.  Glucose, capillary     Status: Abnormal   Collection Time: 01/17/23  5:25 PM  Result Value Ref Range   Glucose-Capillary 187 (H) 70 - 99 mg/dL    Comment: Glucose reference range applies only to samples taken after fasting for at least 8 hours.  Glucose, capillary     Status: Abnormal   Collection Time: 01/17/23  9:22 PM  Result Value Ref Range   Glucose-Capillary 161 (H) 70 - 99 mg/dL    Comment: Glucose reference range applies only to samples taken after fasting for at least 8 hours.  Glucose, capillary  Status: Abnormal   Collection Time: 01/18/23  8:00 AM  Result Value Ref Range   Glucose-Capillary 100 (H)  70 - 99 mg/dL    Comment: Glucose reference range applies only to samples taken after fasting for at least 8 hours.   Comment 1 Notify RN     Radiology: DG Knee Left Port  Result Date: 01/17/2023 CLINICAL DATA:  Knee replacement EXAM: PORTABLE LEFT KNEE - 1-2 VIEW COMPARISON:  None Available. FINDINGS: There is a new left knee arthroplasty in anatomic alignment. There is anterior soft tissue swelling, air, surgical drain and skin staples compatible with recent surgery. There is no evidence for fracture. IMPRESSION: New left knee arthroplasty in anatomic alignment. Electronically Signed   By: Ronney Asters M.D.   On: 01/17/2023 15:59    No results found.  DG Knee Left Port  Result Date: 01/17/2023 CLINICAL DATA:  Knee replacement EXAM: PORTABLE LEFT KNEE - 1-2 VIEW COMPARISON:  None Available. FINDINGS: There is a new left knee arthroplasty in anatomic alignment. There is anterior soft tissue swelling, air, surgical drain and skin staples compatible with recent surgery. There is no evidence for fracture. IMPRESSION: New left knee arthroplasty in anatomic alignment. Electronically Signed   By: Ronney Asters M.D.   On: 01/17/2023 15:59      Assessment and Plan: Patient Active Problem List   Diagnosis Date Noted   Total knee replacement status 01/17/2023   Degeneration of lumbar intervertebral disc 12/16/2022   Scoliosis of lumbar spine 12/16/2022   Primary osteoarthritis of left knee 12/02/2022   Severe obesity (BMI 35.0-39.9) with comorbidity 12/02/2022   Arthritis of right hip 07/04/2022   Low back pain 07/04/2022   Sacroiliac joint pain 07/04/2022   Arthropathy of lumbar facet joint 07/04/2022   Lumbar radiculopathy 07/04/2022   Lumbar spondylosis 07/04/2022   Spondylolisthesis, lumbar region 07/04/2022   Prediabetes 05/26/2022   Primary hypertension 05/15/2022   Coronary artery disease involving native coronary artery of native heart 08/18/2021   Fibromyalgia 05/05/2021   CPAP  use counseling 03/14/2021   Secondary hyperparathyroidism 02/15/2021   Tobacco use 02/15/2021   Primary osteoarthritis of both first carpometacarpal joints 11/09/2019   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/18/2019   Nausea and vomiting in adult 06/02/2019   Bilateral carotid artery stenosis 11/22/2018   SOBOE (shortness of breath on exertion) 11/22/2018   Endophthalmitis, right eye 07/09/2018   Pseudophakia of right eye 07/09/2018   Current moderate episode of major depressive disorder without prior episode 01/02/2018   Primary osteoarthritis of right knee 12/07/2017   CRI (chronic renal insufficiency), stage 3 (moderate) 11/22/2017   Gastroesophageal reflux disease without esophagitis 11/22/2017   Obesity (BMI 35.0-39.9 without comorbidity) 11/22/2017   Other emphysema 11/22/2017   Age-related osteoporosis without current pathological fracture 10/18/2016   Bilateral chronic knee pain 10/18/2016   Chronic thumb pain, bilateral 10/18/2016   Carpal tunnel syndrome of left wrist 06/07/2016   Restless leg syndrome 12/14/2015   Arthritis of knee 12/14/2015   Shingles 08/23/2015   OSA on CPAP 05/18/2015   Hyperlipidemia    Hypothyroid    Hypertension    Depression     1. OSA on CPAP The patient does  tolerate PAP and reports  benefit from PAP use. Her apnea is not controlled. Her download shows an AHI of 22.7, and most of the events are obstructive. We will change her pressure to APAP 8-16, do a 2 week download, and set up a cpap titration to determine  optimal treatment pressure The patient was reminded how to clean equipment and advised to replace supplies routinely. The patient was also counselled on weight loss. The compliance is very good.   OSA on cpap- not controlled. Change to APAP 8-16. The patient continues to require cpap therapy to treat her OSA. F/u 2 week download, and cpap titration.   2. CPAP use counseling CPAP Counseling: had a lengthy discussion with the  patient regarding the importance of PAP therapy in management of the sleep apnea. Patient appears to understand the risk factor reduction and also understands the risks associated with untreated sleep apnea. Patient will try to make a good faith effort to remain compliant with therapy. Also instructed the patient on proper cleaning of the device including the water must be changed daily if possible and use of distilled water is preferred. Patient understands that the machine should be regularly cleaned with appropriate recommended cleaning solutions that do not damage the PAP machine for example given white vinegar and water rinses. Other methods such as ozone treatment Goodwill not be as good as these simple methods to achieve cleaning.   3. Primary hypertension Hypertension Counseling:   The following hypertensive lifestyle modification were recommended and discussed:  1. Limiting alcohol intake to less than 1 oz/day of ethanol:(24 oz of beer or 8 oz of wine or 2 oz of 100-proof whiskey). 2. Take baby ASA 81 mg daily. 3. Importance of regular aerobic exercise and losing weight. 4. Reduce dietary saturated fat and cholesterol intake for overall cardiovascular health. 5. Maintaining adequate dietary potassium, calcium, and magnesium intake. 6. Regular monitoring of the blood pressure. 7. Reduce sodium intake to less than 100 mmol/day (less than 2.3 gm of sodium or less than 6 gm of sodium choride)    4. Restless leg syndrome Pt continues to use large amounts of caffeine daily (cups of coffee and several caffeinated soft drinks) and smokes a pack of cigarettes daily. I have explained it will be difficult to control her RLS in the presence of these habits. I have encouraged her to quit smoking and to taper down and if possible eliminate caffeine.     General Counseling: I have discussed the findings of the evaluation and examination with Sherri Peters.  I have also discussed any further diagnostic evaluation  thatmay be needed or ordered today. Sherri Peters verbalizes understanding of the findings of todays visit. We also reviewed her medications today and discussed drug interactions and side effects including but not limited excessive drowsiness and altered mental states. We also discussed that there is always a risk not just to her but also people around her. she has been encouraged to call the office with any questions or concerns that should arise related to todays visit.  No orders of the defined types were placed in this encounter.       I have personally obtained a history, examined the patient, evaluated laboratory and imaging results, formulated the assessment and plan and placed orders. This patient was seen today by Tressie Ellis, PA-C in collaboration with Dr. Devona Konig.   Allyne Gee, MD [facility] Diplomate ABMS Pulmonary Critical Care Medicine and Sleep Medicine

## 2023-02-05 NOTE — Patient Instructions (Signed)
CPAP and BIPAP Information CPAP and BIPAP are methods that use air pressure to keep your airways open and to help you breathe well. CPAP and BIPAP use different amounts of pressure. Your health care provider will tell you whether CPAP or BIPAP would be more helpful for you. CPAP stands for "continuous positive airway pressure." With CPAP, the amount of pressure stays the same while you breathe in (inhale) and out (exhale). BIPAP stands for "bi-level positive airway pressure." With BIPAP, the amount of pressure will be higher when you inhale and lower when you exhale. This allows you to take larger breaths. CPAP or BIPAP Dorsi be used in the hospital, or your health care provider Alessandrini want you to use it at home. You Culley need to have a sleep study before your health care provider can order a machine for you to use at home. What are the advantages? CPAP or BIPAP can be helpful if you have: Sleep apnea. Chronic obstructive pulmonary disease (COPD). Heart failure. Medical conditions that cause muscle weakness, including muscular dystrophy or amyotrophic lateral sclerosis (ALS). Other problems that cause breathing to be shallow, weak, abnormal, or difficult. CPAP and BIPAP are most commonly used for obstructive sleep apnea (OSA) to keep the airways from collapsing when the muscles relax during sleep. What are the risks? Generally, this is a safe treatment. However, problems Al occur, including: Irritated skin or skin sores if the mask does not fit properly. Dry or stuffy nose or nosebleeds. Dry mouth. Feeling gassy or bloated. Sinus or lung infection if the equipment is not cleaned properly. When should CPAP or BIPAP be used? In most cases, the mask only needs to be worn during sleep. Generally, the mask needs to be worn throughout the night and during any daytime naps. People with certain medical conditions [place] also need to wear the mask at other times, such as when they are awake. Follow instructions  from your health care provider about when to use the machine. What happens during CPAP or BIPAP?  Both CPAP and BIPAP are provided by a small machine with a flexible plastic tube that attaches to a plastic mask that you wear. Air is blown through the mask into your nose or mouth. The amount of pressure that is used to blow the air can be adjusted on the machine. Your health care provider will set the pressure setting and help you find the best mask for you. Tips for using the mask Because the mask needs to be snug, some people feel trapped or closed-in (claustrophobic) when first using the mask. If you feel this way, you Benally need to get used to the mask. One way to do this is to hold the mask loosely over your nose or mouth and then gradually apply the mask more snugly. You can also gradually increase the amount of time that you use the mask. Masks are available in various types and sizes. If your mask does not fit well, talk with your health care provider about getting a different one. Some common types of masks include: Full face masks, which fit over the mouth and nose. Nasal masks, which fit over the nose. Nasal pillow or prong masks, which fit into the nostrils. If you are using a mask that fits over your nose and you tend to breathe through your mouth, a chin strap Natarajan be applied to help keep your mouth closed. Use a skin barrier to protect your skin as told by your health care provider. Some CPAP   and BIPAP machines have alarms that Burek sound if the mask comes off or develops a leak. If you have trouble with the mask, it is very important that you talk with your health care provider about finding a way to make the mask easier to tolerate. Do not stop using the mask. There could be a negative impact on your health if you stop using the mask. Tips for using the machine Place your CPAP or BIPAP machine on a secure table or stand near an electrical outlet. Know where the on/off switch is on the  machine. Follow instructions from your health care provider about how to set the pressure on your machine and when you should use it. Do not eat or drink while the CPAP or BIPAP machine is on. Food or fluids could get pushed into your lungs by the pressure of the CPAP or BIPAP. For home use, CPAP and BIPAP machines can be rented or purchased through home health care companies. Many different brands of machines are available. Renting a machine before purchasing Arenivas help you find out which particular machine works well for you. Your health insurance company Rawling also decide which machine you Schnake get. Keep the CPAP or BIPAP machine and attachments clean. Ask your health care provider for specific instructions. Check the humidifier if you have a dry stuffy nose or nosebleeds. Make sure it is working correctly. Follow these instructions at home: Take over-the-counter and prescription medicines only as told by your health care provider. Ask if you can take sinus medicine if your sinuses are blocked. Do not use any products that contain nicotine or tobacco. These products include cigarettes, chewing tobacco, and vaping devices, such as e-cigarettes. If you need help quitting, ask your health care provider. Keep all follow-up visits. This is important. Contact a health care provider if: You have redness or pressure sores on your head, face, mouth, or nose from the mask or head gear. You have trouble using the CPAP or BIPAP machine. You cannot tolerate wearing the CPAP or BIPAP mask. Someone tells you that you snore even when wearing your CPAP or BIPAP. Get help right away if: You have trouble breathing. You feel confused. Summary CPAP and BIPAP are methods that use air pressure to keep your airways open and to help you breathe well. If you have trouble with the mask, it is very important that you talk with your health care provider about finding a way to make the mask easier to tolerate. Do not stop using  the mask. There could be a negative impact to your health if you stop using the mask. Follow instructions from your health care provider about when to use the machine. This information is not intended to replace advice given to you by your health care provider. Make sure you discuss any questions you have with your health care provider. Document Revised: 06/01/2021 Document Reviewed: 10/01/2020 Elsevier Patient Education  2023 Elsevier Inc.  

## 2023-04-12 ENCOUNTER — Encounter (INDEPENDENT_AMBULATORY_CARE_PROVIDER_SITE_OTHER): Payer: Medicare PPO | Admitting: Internal Medicine

## 2023-04-12 DIAGNOSIS — G4733 Obstructive sleep apnea (adult) (pediatric): Secondary | ICD-10-CM

## 2023-06-08 NOTE — Procedures (Signed)
[facility]  Polysomnogram Report Part I  Phone: [phone] Fax: [phone]  Patient Name: Sherri Peters, Sherri T. Acquisition Number: [id number]  Date of Birth: 11-Nov-1945 Acquisition Date: 04/12/2023  Referring Physician: Daniel Nones, MD      Procedure: This routine overnight polysomnogram was performed on the Alice 4 or 5 using the standard CPAP/BIPAP protocol. This included 6 channels of EEG, 2 channels of EOG, chin EMG, bilateral anterior tibialis EMG, nasal/oral thermister, PTAF (nasal pressure transducer), chest and abdominal wall movements, EKG, and pulse oximetry.  Description: The total recording time was 460.0 minutes. The total sleep time was 339.5 minutes. There were a total of 92.0 minutes of wakefulness after sleep onset for a reducedsleep efficiency of 73.8%. The latency to sleep onset was within normal limits at 28.5 minutes. The R sleep onset latency was prolonged at 216.5 minutes. Sleep parameters, as a percentage of the total sleep time, demonstrated 5.0% of sleep was in N1 sleep, 65.2% N2, 0.0% N3 and 29.7% R sleep. There were a total of 25 arousals for an arousal index of 4.4 arousals per hour of sleep that was normal.  Overall, there were a total of 64 respiratory events for a respiratory disturbance index, which includes apneas, hypopneas and RERAs (increased respiratory effort) of 11.3 respiratory events per hour of sleep during the pressure titration.  was initiated at  cm of H2O at lights out, 10:02:53 PM. It was titrated in 1-2 cm increments to the final pressure of  cm of H2O.   Additionally, the baseline oxygen saturation during wakefulness was 94%, during NREM sleep averaged 92%, and during REM sleep averaged 93%. The total duration of oxygen < 90% was 11.7 minutes and <80% was 0.2 minutes.  Cardiac monitoring-  demonstrate transient cardiac decelerations associated with the apneas.  significant cardiac rhythm irregularities.   Periodic limb movement  monitoring- demonstrated that there were 396 periodic limb movements for a periodic limb movement index of 70.0 periodic limb movements per hour of sleep.   Impression: This patient's obstructive sleep apnea demonstrated significant improvement with the utilization of nasal .   There was a highly elevated periodic limb movement index of 70.0 periodic limb movements per hour of sleep.     Recommendations: Would recommend utilization of nasal BiPAP at 20/16 cm of H2O.      A mask, size , was used. Chin strap used during study-no.  Humidifier used during study- yes.     Yevonne Pax, MD [facility] Diplomate ABMS Pulmonary Critical Care and Sleep Medicine Electronically reviewed and digitally signed [facility] CPAP/BIPAP Polysomnogram Report Part II Phone: [phone] Fax: [phone]  Patient last name Sherri Peters Neck Size 14.0 in. Acquisition [id number]  Patient first name Sherri T. Weight 177.0 lbs. Started 04/12/2023 at 9:50:53 PM  Birth date 06/11/1946 Height 63.0 in. Stopped 04/13/2023 at 5:46:23 AM  Age 77      Type Adult BMI 31.4 lb/in2 Duration 460.0  Morrow B. RPSGT. / Ricky P.  Reviewed by: Valentino Hue Henke, PhD, ABSM, FAASM Sleep Data: Lights Out: 10:02:53 PM Sleep Onset: 10:31:23 PM  Lights On: 5:42:53 AM Sleep Efficiency: 73.8 %  Total Recording Time: 460.0 min Sleep Latency (from Lights Off) 28.5 min  Total Sleep Time (TST): 339.5 min R Latency (from Sleep Onset): 216.5 min  Sleep Period Time: 431.0 min Total number of awakenings: 13  Wake during sleep: 91.5 min Wake After Sleep Onset (WASO): 92.0 min   Sleep Data:  Arousal Summary: Stage  Latency from lights out (min) Latency from sleep onset (min) Duration (min) % Total Sleep Time  Normal values  N 1 28.5 0.0 17.0 5.0 (5%)  N 2 31.5 3.0 221.5 65.2 (50%)  N 3       0.0 0.0 (20%)  R 245.0 216.5 101.0 29.7 (25%)   Number Index  Spontaneous 13 2.3  Apneas & Hypopneas 0 0.0  RERAs 0 0.0       (Apneas &  Hypopneas & RERAs)  (0) (0.0)  Limb Movement 12 2.1  Snore 0 0.0  TOTAL 25 4.4     Respiratory Data:  CA OA MA Apnea Hypopnea* A+ H RERA Total  Number 0 44 0 44 20 64 0 64  Mean Dur (sec) 0.0 14.5 0.0 14.5 18.8 15.8 0.0 15.8  Max Dur (sec) 0.0 40.5 0.0 40.5 40.0 40.5 0.0 40.5  Total Dur (min) 0.0 10.6 0.0 10.6 6.3 16.9 0.0 16.9  % of TST 0.0 3.1 0.0 3.1 1.8 5.0 0.0 5.0  Index (#/h TST) 0.0 7.8 0.0 7.8 3.5 11.3 0.0 11.3  *Hypopneas scored based on 4% or greater desaturation.  Sleep Stage:         REM NREM TST  AHI 20.2 7.3 11.3  RDI 20.2 7.3 11.3   Sleep (min) TST (%) REM (min) NREM (min) CA (#) OA (#) MA (#) HYP (#) AHI (#/h) RERA (#) RDI (#/h) Desat (#)  Supine 339.5 100.00 101.0 238.5 0 44 0 20 11.3 0 11.3 51  Non-Supine 0.00 0.00 0.00 0.00 0.00 0.00 0.00 0.00 0.00 0 0.00 0.00     Snoring: Total number of snoring episodes  0  Total time with snoring    min (   % of sleep)   Oximetry Distribution:             WK REM NREM TOTAL  Average (%)   94 93 92 93  < 90% 0.1 10.3 1.3 11.7  < 80% 0.1 0.1 0.0 0.2  < 70% 0.1 0.0 0.0 0.1  # of Desaturations* 0 31 20 51  Desat Index (#/hour) 0.0 18.4 5.0 9.0  Desat Max (%) 0 12 8 12   Desat Max Dur (sec) 0.0 74.0 103.0 103.0  Approx Min O2 during sleep 78  Approx min O2 during a respiratory event 78  Was Oxygen added (Y/N) and final rate :    LPM  *Desaturations based on 4% or greater drop from baseline.   Cheyne Stokes Breathing: None Present    Heart Rate Summary:  Average Heart Rate During Sleep 59.0 bpm      Highest Heart Rate During Sleep (95th %) 86.0 bpm      Highest Heart Rate During Sleep 255 bpm (artifact)  Highest Heart Rate During Recording (TIB) 255 bpm (artifact)   Heart Rate Observations: Event Type # Events   Bradycardia 0 Lowest HR Scored: N/A  Sinus Tachycardia During Sleep 0 Highest HR Scored: N/A  Narrow Complex Tachycardia 0 Highest HR Scored: N/A  Wide Complex Tachycardia 0 Highest HR  Scored: N/A  Asystole 0 Longest Pause: N/A  Atrial Fibrillation 0 Duration Longest Event: N/A  Other Arrythmias   Type:   Periodic Limb Movement Data: (Primary legs unless otherwise noted) Total # Limb Movement 401 Limb Movement Index 70.9  Total # PLMS 396 PLMS Index 70.0  Total # PLMS Arousals 12 PLMS Arousal Index 2.1  Percentage Sleep Time with PLMS 153.74min (45.1 % sleep)  Mean  Duration limb movements (secs) 834.6    IPAP Level (cmH2O) EPAP Level (cmH2O) Total Duration (min) Sleep Duration (min) Sleep (%) REM (%) CA  #) OA # MA # HYP #) AHI (#/hr) RERAs # RERAs (#/hr) RDI (#/hr)  4 4 26.9 21.4 79.6 0.0 0 2 0 0 5.6 0 0.0 5.6  6 6  24.9 23.9 96.0 0.0 0 5 0 0 12.6 0 0.0 12.6  8 8  84.7 84.2 99.4 0.0 0 5 0 4 6.4 0 0.0 6.4  9 9  74.4 74.4 100.0 0.0 0 7 0 5 9.7 0 0.0 9.7  11 11  11.1 10.1 91.0 62.2 0 8 0 2 59.4 0 0.0 59.4  13 13  7.4 7.4 100.0 100.0 0 9 0 1 81.1 0 0.0 81.1  15 15  11.4 11.4 100.0 100.0 0 2 0 3 26.3 0 0.0 26.3  16 16  11.9 11.9 100.0 100.0 0 5 0 3 40.3 0 0.0 40.3  20 16  103.8 59.3 57.1 56.6 0 0 0 0 0.0 0 0.0 0.0  22 18 64.3 32.8 51.0 5.1 0 0 0 0 0.0 0 0.0 0.0

## 2023-07-06 ENCOUNTER — Other Ambulatory Visit: Payer: Self-pay

## 2023-07-06 ENCOUNTER — Emergency Department: Payer: Medicare PPO

## 2023-07-06 ENCOUNTER — Emergency Department
Admission: EM | Admit: 2023-07-06 | Discharge: 2023-07-06 | Disposition: A | Discharge status: Home / Self Care | Payer: Medicare PPO | Attending: Emergency Medicine | Admitting: Emergency Medicine

## 2023-07-06 DIAGNOSIS — R1030 Lower abdominal pain, unspecified: Secondary | ICD-10-CM | POA: Diagnosis present

## 2023-07-06 DIAGNOSIS — E871 Hypo-osmolality and hyponatremia: Secondary | ICD-10-CM | POA: Insufficient documentation

## 2023-07-06 DIAGNOSIS — N3000 Acute cystitis without hematuria: Secondary | ICD-10-CM | POA: Diagnosis not present

## 2023-07-06 MED ORDER — PREGABALIN 50 MG PO CAPS
100.0000 mg | ORAL_CAPSULE | Freq: Once | ORAL | Status: AC
  Administered 2023-07-06: 100 mg via ORAL
  Filled 2023-07-06: qty 2

## 2023-07-06 MED ORDER — SODIUM CHLORIDE 0.9 % IV SOLN
1.0000 g | Freq: Once | INTRAVENOUS | Status: AC
  Administered 2023-07-06: 1 g via INTRAVENOUS
  Filled 2023-07-06: qty 10

## 2023-07-06 MED ORDER — MORPHINE SULFATE (PF) 2 MG/ML IV SOLN
2.0000 mg | Freq: Once | INTRAVENOUS | Status: AC
  Administered 2023-07-06: 2 mg via INTRAVENOUS
  Filled 2023-07-06: qty 1

## 2023-07-06 MED ORDER — ONDANSETRON HCL 4 MG/2ML IJ SOLN
4.0000 mg | Freq: Once | INTRAMUSCULAR | Status: AC
  Administered 2023-07-06: 4 mg via INTRAVENOUS
  Filled 2023-07-06: qty 2

## 2023-07-06 MED ORDER — IOHEXOL 300 MG/ML  SOLN
100.0000 mL | Freq: Once | INTRAMUSCULAR | Status: AC | PRN
  Administered 2023-07-06: 100 mL via INTRAVENOUS

## 2023-07-06 MED ORDER — CEPHALEXIN 500 MG PO CAPS: 500.0000 mg | ORAL_CAPSULE | Freq: Four times a day (QID) | ORAL | 0 refills | Status: AC

## 2023-07-06 MED ORDER — ONDANSETRON 4 MG PO TBDP: 4.0000 mg | ORAL_TABLET | Freq: Three times a day (TID) | ORAL | 0 refills | Status: AC | PRN

## 2023-07-06 NOTE — Discharge Instructions (Signed)
Take Keflex four times daily for the next seven days.

## 2023-07-06 NOTE — ED Provider Notes (Signed)
[facility] Provider Note  Patient Contact: 4:14 PM (approximate)   History   Abdominal Pain and Emesis   HPI  Sherri Peters is a 77 y.o. female presents to the emergency department with cramping lower abdominal pain.  Patient is also had some diarrhea and nausea.  She denies fever or chills.  Patient was seen at [facility] clinic prior to being referred to the emergency department and had basic labs conducted which indicated findings suggestive of UTI and some mild hyponatremia.  Patient denies chest pain or chest tightness.      Physical Exam   Triage Vital Signs: ED Triage Vitals  Encounter Vitals Group     BP 07/06/23 1304 (!) 156/87     Systolic BP Percentile --      Diastolic BP Percentile --      Pulse Rate 07/06/23 1304 85     Resp 07/06/23 1304 18     Temp 07/06/23 1304 98.1 F (36.7 C)     Temp Source 07/06/23 1304 Oral     SpO2 07/06/23 1304 99 %     Weight 07/06/23 1305 173 lb (78.5 kg)     Height 07/06/23 1305 5\' 3"  (1.6 m)     Head Circumference --      Peak Flow --      Pain Score 07/06/23 1305 0     Pain Loc --      Pain Education --      Exclude from Growth Chart --     Most recent vital signs: Vitals:   07/06/23 1304 07/06/23 1713  BP: (!) 156/87 (!) 170/69  Pulse: 85 88  Resp: 18 18  Temp: 98.1 F (36.7 C)   SpO2: 99% 100%     General: Alert and in no acute distress. Eyes:  PERRL. EOMI. Head: No acute traumatic findings ENT:      Nose: No congestion/rhinnorhea.      Mouth/Throat: Mucous membranes are moist. Neck: No stridor. No cervical spine tenderness to palpation. Cardiovascular:  Good peripheral perfusion Respiratory: Normal respiratory effort without tachypnea or retractions. Lungs CTAB. Good air entry to the bases with no decreased or absent breath sounds. Gastrointestinal: Bowel sounds 4 quadrants. Soft and nontender to palpation. No guarding or rigidity. No palpable masses. No distention. No CVA  tenderness. Musculoskeletal: Full range of motion to all extremities.  Neurologic:  No gross focal neurologic deficits are appreciated.  Skin:   No rash noted    ED Results / Procedures / Treatments   Labs (all labs ordered are listed, but only abnormal results are displayed) Labs Reviewed - No data to display      RADIOLOGY  I personally viewed and evaluated these images as part of my medical decision making, as well as reviewing the written report by the radiologist.  ED Provider Interpretation: CT abdomen pelvis unremarkable.   PROCEDURES:  Critical Care performed: No  Procedures   MEDICATIONS ORDERED IN ED: Medications  cefTRIAXone (ROCEPHIN) 1 g in sodium chloride 0.9 % 100 mL IVPB (has no administration in time range)  pregabalin (LYRICA) capsule 100 mg (100 mg Oral Given 07/06/23 1629)  morphine (PF) 2 MG/ML injection 2 mg (2 mg Intravenous Given 07/06/23 1722)  ondansetron (ZOFRAN) injection 4 mg (4 mg Intravenous Given 07/06/23 1719)  iohexol (OMNIPAQUE) 300 MG/ML solution 100 mL (100 mLs Intravenous Contrast Given 07/06/23 1655)     IMPRESSION / MDM / ASSESSMENT AND PLAN / ED COURSE  I reviewed the  triage vital signs and the nursing notes.                              Assessment and plan:  Abdominal discomfort 77 year old female presents to the emergency department with crampy lower abdominal discomfort, diarrhea and some nausea and vomiting.  Patient was hypertensive at triage but vital signs were otherwise reassuring.  On exam, patient alert, active and nontoxic-appearing.  Abdomen was soft and nontender without guarding.  Labs from [facility] clinic were reviewed which shows findings suggestive of UTI.  CBC and CMP were reassuring.  Patient was given IV Rocephin in the emergency department and was discharged with Keflex.  Return precautions were given to return with new or worsening symptoms.  All patient questions were answered.   FINAL CLINICAL  IMPRESSION(S) / ED DIAGNOSES   Final diagnoses:  Acute cystitis without hematuria     Rx / DC Orders   ED Discharge Orders          Ordered    cephALEXin (KEFLEX) 500 MG capsule  4 times daily        07/06/23 1750             Note:  This document was prepared using Dragon voice recognition software and [place] include unintentional dictation errors.   Pia Mau [place], PA-C 07/06/23 1800    Dionne Bucy, MD 07/06/23 [phone]

## 2023-07-06 NOTE — ED Notes (Signed)
Patient and husband are not in the room.

## 2023-07-06 NOTE — ED Triage Notes (Signed)
Arrives from [facility] for C/O Nausea, lower abdominal cramping, also low sodium today. Per report Na 130

## 2023-07-06 NOTE — ED Triage Notes (Signed)
Pt presents to ED with c/o of ABD pain, N/V. Pt states sent from [facility] clinic and was sent for further eval of nausea and low NA+ (130) per blood work. NAD noted. Pt states this has been ongoing for months.

## 2023-07-25 IMAGING — CR DG CHEST 2V
2 series · 2 of 2 positions shown · non-contrast
Comparison: 07/31/2021

CLINICAL DATA: Short of breath

EXAM:
CHEST - 2 VIEW

[chest lat]
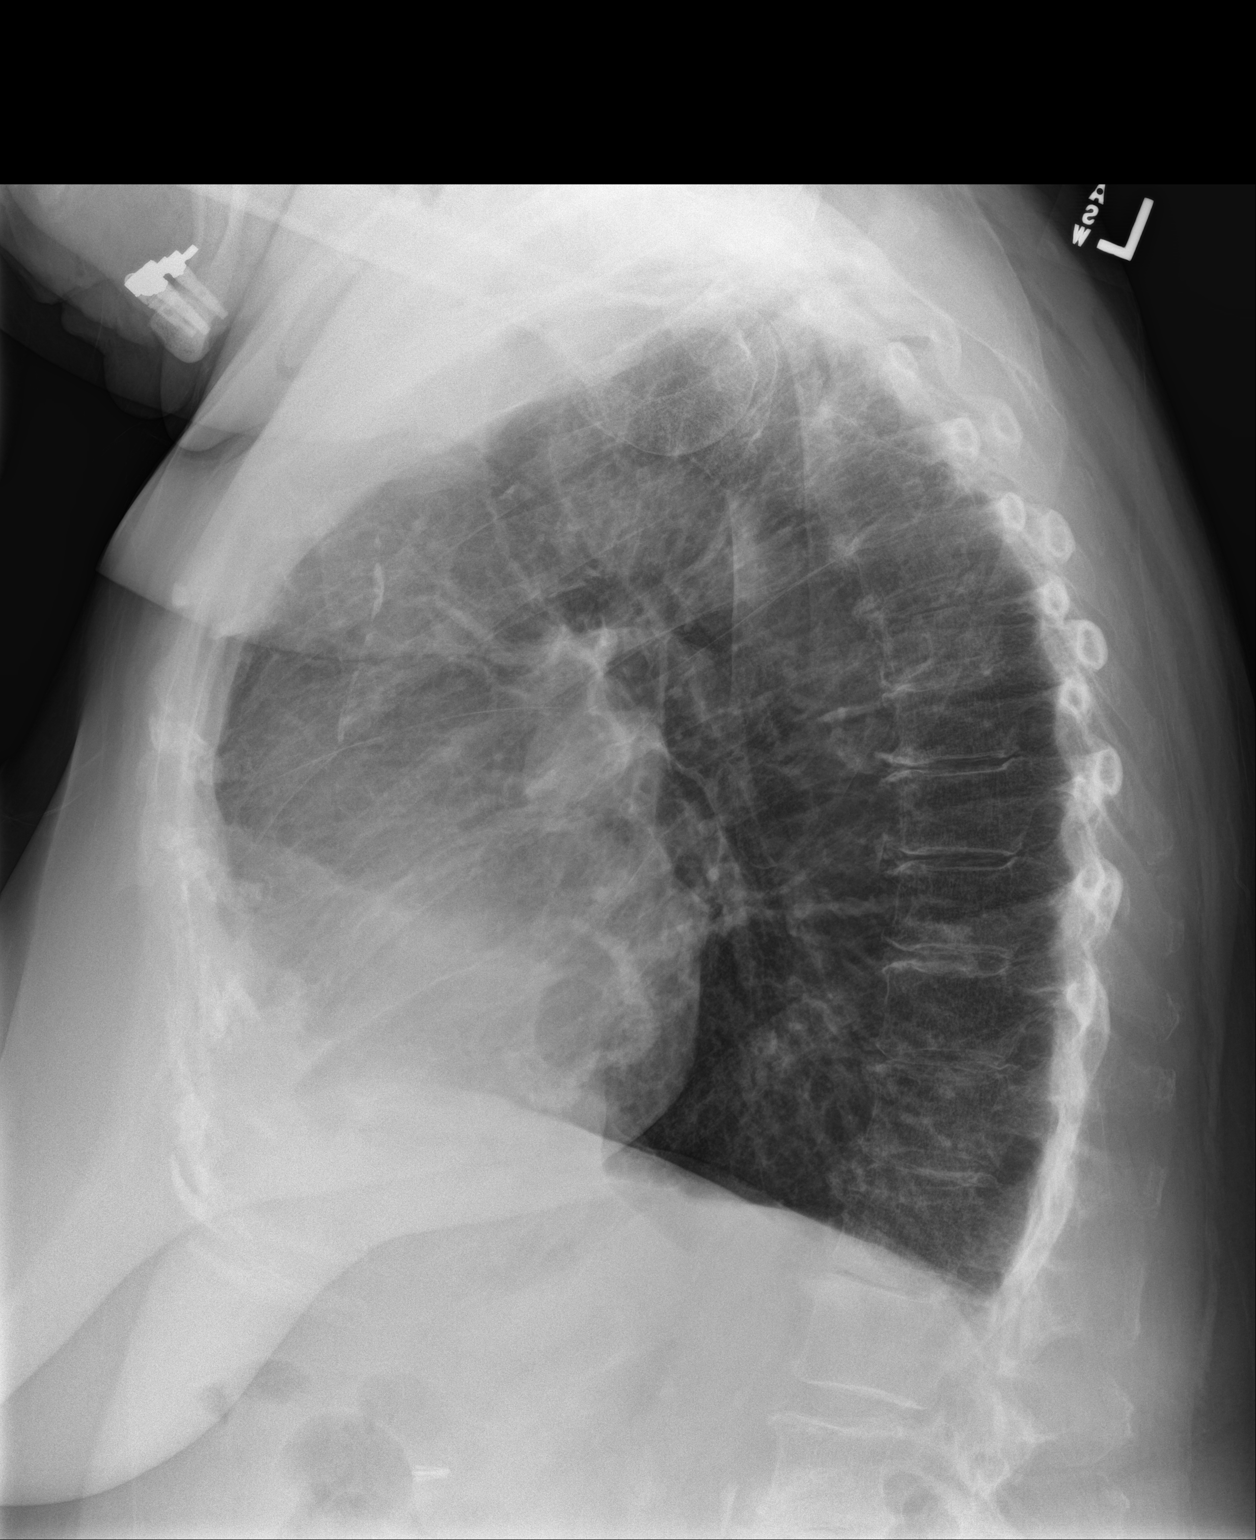

[chest pa]
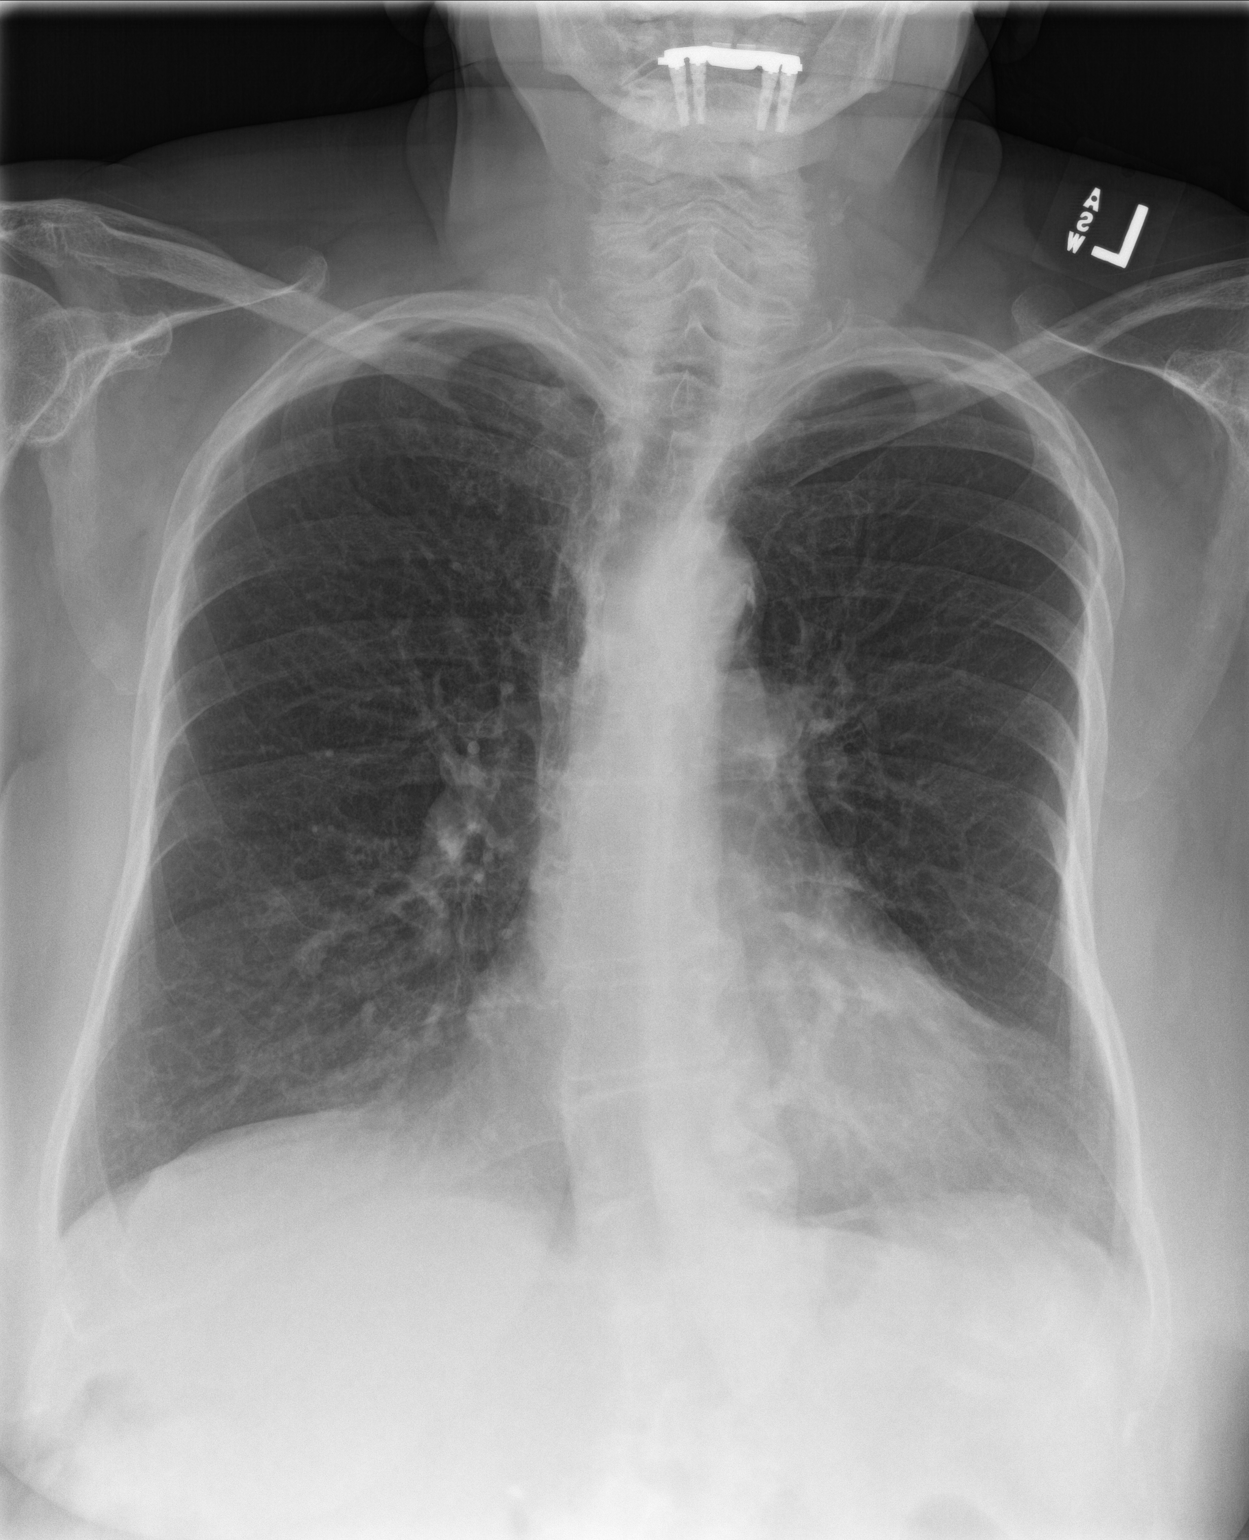

[2 of 2 positions shown; findings below may reference images not displayed]

FINDINGS: Heart size and vascularity normal. Interval clearing of right middle
lobe infiltrate. Lungs now clear without infiltrate effusion

COPD with hyperinflation.
IMPRESSION: No active cardiopulmonary disease.

## 2023-10-08 ENCOUNTER — Other Ambulatory Visit: Payer: Self-pay

## 2023-10-08 ENCOUNTER — Observation Stay
Admission: EM | Admit: 2023-10-08 | Discharge: 2023-10-09 | Disposition: A | Discharge status: Home / Self Care | Payer: Medicare PPO | Attending: Internal Medicine | Admitting: Internal Medicine

## 2023-10-08 ENCOUNTER — Emergency Department: Payer: Medicare PPO

## 2023-10-08 DIAGNOSIS — R112 Nausea with vomiting, unspecified: Principal | ICD-10-CM | POA: Diagnosis present

## 2023-10-08 DIAGNOSIS — N189 Chronic kidney disease, unspecified: Secondary | ICD-10-CM | POA: Diagnosis not present

## 2023-10-08 DIAGNOSIS — J449 Chronic obstructive pulmonary disease, unspecified: Secondary | ICD-10-CM | POA: Insufficient documentation

## 2023-10-08 DIAGNOSIS — I2510 Atherosclerotic heart disease of native coronary artery without angina pectoris: Secondary | ICD-10-CM | POA: Diagnosis not present

## 2023-10-08 DIAGNOSIS — K529 Noninfective gastroenteritis and colitis, unspecified: Secondary | ICD-10-CM | POA: Diagnosis not present

## 2023-10-08 DIAGNOSIS — E871 Hypo-osmolality and hyponatremia: Secondary | ICD-10-CM | POA: Insufficient documentation

## 2023-10-08 DIAGNOSIS — G4733 Obstructive sleep apnea (adult) (pediatric): Secondary | ICD-10-CM

## 2023-10-08 DIAGNOSIS — I10 Essential (primary) hypertension: Secondary | ICD-10-CM | POA: Diagnosis present

## 2023-10-08 DIAGNOSIS — N1831 Chronic kidney disease, stage 3a: Secondary | ICD-10-CM | POA: Diagnosis present

## 2023-10-08 DIAGNOSIS — E039 Hypothyroidism, unspecified: Secondary | ICD-10-CM | POA: Diagnosis present

## 2023-10-08 DIAGNOSIS — I6523 Occlusion and stenosis of bilateral carotid arteries: Secondary | ICD-10-CM | POA: Diagnosis present

## 2023-10-08 DIAGNOSIS — E872 Acidosis, unspecified: Secondary | ICD-10-CM | POA: Insufficient documentation

## 2023-10-08 LAB — COMPREHENSIVE METABOLIC PANEL
ALT: 27 U/L (ref 0–44)
AST: 24 U/L (ref 15–41)
Albumin: 3.8 g/dL (ref 3.5–5.0)
Alkaline Phosphatase: 56 U/L (ref 38–126)
Anion gap: 9 (ref 5–15)
BUN: 16 mg/dL (ref 8–23)
CO2: 20 mmol/L — ABNORMAL LOW (ref 22–32)
Calcium: 9.5 mg/dL (ref 8.9–10.3)
Chloride: 105 mmol/L (ref 98–111)
Creatinine, Ser: 1.2 mg/dL — ABNORMAL HIGH (ref 0.44–1.00)
GFR, Estimated: 47 mL/min — ABNORMAL LOW (ref >60)
Glucose, Bld: 113 mg/dL — ABNORMAL HIGH (ref 70–99)
Potassium: 4.4 mmol/L (ref 3.5–5.1)
Sodium: 134 mmol/L — ABNORMAL LOW (ref 135–145)
Total Bilirubin: 0.5 mg/dL (ref <1.2)
Total Protein: 6.4 g/dL — ABNORMAL LOW (ref 6.5–8.1)

## 2023-10-08 LAB — CBC
HCT: 39.9 % (ref 36.0–46.0)
Hemoglobin: 12.7 g/dL (ref 12.0–15.0)
MCH: 30.5 pg (ref 26.0–34.0)
MCHC: 31.8 g/dL (ref 30.0–36.0)
MCV: 95.9 fL (ref 80.0–100.0)
Platelets: 255 10*3/uL (ref 150–400)
RBC: 4.16 MIL/uL (ref 3.87–5.11)
RDW: 16.8 % — ABNORMAL HIGH (ref 11.5–15.5)
WBC: 7.4 10*3/uL (ref 4.0–10.5)
nRBC: 0 % (ref 0.0–0.2)

## 2023-10-08 LAB — TROPONIN I (HIGH SENSITIVITY): Troponin I (High Sensitivity): 5 ng/L (ref <18)

## 2023-10-08 LAB — LIPASE, BLOOD: Lipase: 34 U/L (ref 11–51)

## 2023-10-08 MED ORDER — SODIUM CHLORIDE 0.9 % IV SOLN
INTRAVENOUS | Status: DC
  Administered 2023-10-08: 100 mL via INTRAVENOUS

## 2023-10-08 MED ORDER — HYDRALAZINE HCL 10 MG PO TABS: 10.0000 mg | ORAL_TABLET | Freq: Four times a day (QID) | ORAL | Status: DC | PRN

## 2023-10-08 MED ORDER — ENOXAPARIN SODIUM 40 MG/0.4ML IJ SOSY
40.0000 mg | PREFILLED_SYRINGE | INTRAMUSCULAR | Status: DC
  Administered 2023-10-08: 40 mg via SUBCUTANEOUS

## 2023-10-08 MED ORDER — TELMISARTAN-HCTZ 80-12.5 MG PO TABS: 1.0000 | ORAL_TABLET | Freq: Every day | ORAL | Status: DC

## 2023-10-08 MED ORDER — SODIUM CHLORIDE 0.9 % IV BOLUS
500.0000 mL | Freq: Once | INTRAVENOUS | Status: AC
  Administered 2023-10-08: 500 mL via INTRAVENOUS

## 2023-10-08 MED ORDER — SODIUM CHLORIDE 0.9 % IV SOLN
12.5000 mg | Freq: Once | INTRAVENOUS | Status: AC
  Administered 2023-10-08: 12.5 mg via INTRAVENOUS
  Filled 2023-10-08: qty 12.5

## 2023-10-08 MED ORDER — PANTOPRAZOLE SODIUM 20 MG PO TBEC
20.0000 mg | DELAYED_RELEASE_TABLET | Freq: Two times a day (BID) | ORAL | Status: DC
  Administered 2023-10-09: 20 mg via ORAL
  Filled 2023-10-08: qty 1

## 2023-10-08 MED ORDER — CITALOPRAM HYDROBROMIDE 20 MG PO TABS
40.0000 mg | ORAL_TABLET | Freq: Every day | ORAL | Status: DC
  Administered 2023-10-09: 40 mg via ORAL
  Filled 2023-10-08: qty 2

## 2023-10-08 MED ORDER — BUSPIRONE HCL 5 MG PO TABS
5.0000 mg | ORAL_TABLET | Freq: Every day | ORAL | Status: DC
  Administered 2023-10-09: 5 mg via ORAL
  Filled 2023-10-08: qty 1

## 2023-10-08 MED ORDER — ATORVASTATIN CALCIUM 20 MG PO TABS
40.0000 mg | ORAL_TABLET | Freq: Every day | ORAL | Status: DC
Start: 2023-10-09 — End: 2023-10-09
  Administered 2023-10-09: 40 mg via ORAL
  Filled 2023-10-08: qty 2

## 2023-10-08 MED ORDER — BUTALBITAL-APAP-CAFFEINE 50-325-40 MG PO TABS
1.0000 | ORAL_TABLET | Freq: Four times a day (QID) | ORAL | Status: DC | PRN
  Administered 2023-10-09: 1 via ORAL
  Filled 2023-10-08: qty 1

## 2023-10-08 MED ORDER — ONDANSETRON HCL 4 MG/2ML IJ SOLN
4.0000 mg | Freq: Once | INTRAMUSCULAR | Status: AC
  Administered 2023-10-08: 4 mg via INTRAVENOUS
  Filled 2023-10-08: qty 2

## 2023-10-08 MED ORDER — HYDROCHLOROTHIAZIDE 12.5 MG PO TABS
12.5000 mg | ORAL_TABLET | Freq: Every day | ORAL | Status: DC
  Administered 2023-10-09: 12.5 mg via ORAL
  Filled 2023-10-08: qty 1

## 2023-10-08 MED ORDER — ACETAMINOPHEN 325 MG PO TABS
650.0000 mg | ORAL_TABLET | Freq: Once | ORAL | Status: AC
  Administered 2023-10-08: 650 mg via ORAL
  Filled 2023-10-08: qty 2

## 2023-10-08 MED ORDER — IRBESARTAN 150 MG PO TABS
300.0000 mg | ORAL_TABLET | Freq: Every day | ORAL | Status: DC
  Administered 2023-10-09: 300 mg via ORAL
  Filled 2023-10-08: qty 2

## 2023-10-08 MED ORDER — OXYCODONE HCL 5 MG PO TABS
5.0000 mg | ORAL_TABLET | ORAL | Status: DC | PRN
  Administered 2023-10-09: 5 mg via ORAL
  Filled 2023-10-08: qty 1

## 2023-10-08 MED ORDER — LEVOTHYROXINE SODIUM 50 MCG PO TABS
75.0000 ug | ORAL_TABLET | Freq: Every day | ORAL | Status: DC
  Administered 2023-10-09: 75 ug via ORAL
  Filled 2023-10-08: qty 1

## 2023-10-08 MED ORDER — LORAZEPAM 2 MG/ML IJ SOLN
0.5000 mg | Freq: Once | INTRAMUSCULAR | Status: AC
  Administered 2023-10-08: 0.5 mg via INTRAVENOUS
  Filled 2023-10-08: qty 1

## 2023-10-08 MED ORDER — SODIUM CHLORIDE 0.9 % IV SOLN
12.5000 mg | Freq: Four times a day (QID) | INTRAVENOUS | Status: DC | PRN
  Administered 2023-10-08: 12.5 mg via INTRAVENOUS
  Filled 2023-10-08: qty 12.5

## 2023-10-08 MED ORDER — AZELASTINE HCL 0.1 % NA SOLN
1.0000 | Freq: Two times a day (BID) | NASAL | Status: DC
Start: 2023-10-08 — End: 2023-10-09
  Administered 2023-10-08 – 2023-10-09 (×2): 1 via NASAL
  Filled 2023-10-08: qty 30

## 2023-10-08 MED ORDER — BUPROPION HCL ER (XL) 150 MG PO TB24
150.0000 mg | ORAL_TABLET | Freq: Every day | ORAL | Status: DC
  Administered 2023-10-09: 150 mg via ORAL
  Filled 2023-10-08: qty 1

## 2023-10-08 MED ORDER — PREGABALIN 50 MG PO CAPS
100.0000 mg | ORAL_CAPSULE | Freq: Three times a day (TID) | ORAL | Status: DC
Start: 2023-10-08 — End: 2023-10-09
  Administered 2023-10-08 – 2023-10-09 (×2): 100 mg via ORAL
  Filled 2023-10-08 (×2): qty 2

## 2023-10-08 MED ORDER — MELATONIN 5 MG PO TABS
5.0000 mg | ORAL_TABLET | Freq: Every day | ORAL | Status: DC
  Administered 2023-10-08: 5 mg via ORAL
  Filled 2023-10-08: qty 1

## 2023-10-08 MED ORDER — PANTOPRAZOLE SODIUM 40 MG IV SOLR
40.0000 mg | Freq: Once | INTRAVENOUS | Status: AC
  Administered 2023-10-08: 40 mg via INTRAVENOUS
  Filled 2023-10-08: qty 10

## 2023-10-08 NOTE — H&P (Signed)
History and Physical    Sherri Peters:[id number] DOB: 1946/05/25 DOA: 10/08/2023  PCP: Ardyth Man, PA-C (Confirm with patient/family/NH records and if not entered, this has to be entered at [facility] point of entry) Patient coming from: Home  I have personally briefly reviewed patient's old medical records in [facility]  Chief Complaint: Nauseous vomiting diarrhea  HPI: Sherri Peters is a 77 y.o. female with medical history significant of HTN, COPD, OSA on CPAP HS, chronic HFpEF, GERD, restless leg syndrome on pregabalin, scented with sudden onset of nausea vomiting diarrhea  Symptoms started yesterday afternoon after patient ate her lunch she started develop cramping-like abdominal pain then soon followed by nauseous vomiting of stomach content none bile nonbloody as well as starting to have loose to watery diarrhea 5-10 episodes since yesterday afternoon and last episode was this morning.  Cramping-like epigastric pain which dissipated in the few hours, denied any fever or chills.  Patient also reported that she has been having intermittent diarrhea, which she attributed to metformin.  She was started on metformin sometime in July this year for prediabetes however she only takes metformin 3 times a week.  ED Course: Afebrile, nontachycardic nonhypotensive, blood work showed hemoglobin 12.7, K4.4, bicarb 20, creatinine 1.2 glucose 113.  Patient was given multiple antiemesis medication including Zofran Ativan and Phenergan but still feeling nauseous.  Review of Systems: As per HPI otherwise 14 point review of systems negative.    Past Medical History:  Diagnosis Date   Aortic atherosclerosis (HCC)    Arthritis    Bilateral carotid artery disease (HCC)    Carpal tunnel syndrome of left wrist    CKD (chronic kidney disease), stage II    COPD (chronic obstructive pulmonary disease) (HCC)    Coronary artery disease    Depression    Diastolic dysfunction    a.) TTE 11/15/2018: EF >55%,  mild LVH, MAC, BAE, triv PR, mild MR/TR, G1DD; b.) TTE 09/12/2021: EF >55%, mild LVH, MAC, triv TR/PR, mod MR, G1DD   Endophthalmitis, right    Fibromyalgia    GERD (gastroesophageal reflux disease)    History of bilateral cataract extraction 06/2018   Hyperlipidemia    Hypertension    Hypothyroidism    OSA on CPAP    Pre-diabetes    RLS (restless legs syndrome)    Statin intolerance    Umbilical hernia    a.) s/p repair 11/10/2016    Past Surgical History:  Procedure Laterality Date   ABDOMINAL HYSTERECTOMY     APPENDECTOMY N/A    CATARACT EXTRACTION Bilateral 06/2018   CHOLECYSTECTOMY N/A 12/08/2019   COLONOSCOPY     KNEE ARTHROPLASTY Left 01/17/2023   Procedure: COMPUTER ASSISTED TOTAL KNEE ARTHROPLASTY;  Surgeon: Donato Heinz, MD;  Location: [facility] ORS;  Service: Orthopedics;  Laterality: Left;   TONSILLECTOMY Bilateral    TOTAL KNEE ARTHROPLASTY Right 06/03/2020   ARTHROPLASTY, KNEE, CONDYLE AND PLATEAU; MEDIAL AND LATERAL COMPARTMENTSWITH OR WITHOUT PATELLA RESURFACING (TOTAL KNEE ARTHROPLASTY); Surgeon: Currie Paris, MD; Location: [facility] OR; Service: Orthopedics; Laterality: Right;   UMBILICAL HERNIA REPAIR  11/10/2016   LAPAROSCOPY, SURGICAL; REPAIR, UMBILICAL HERNIA (INCLUDES MESH INSERTION, WHEN PERFORMED); REDUCIBLE - laparoscopic umbilical hernia repair. GETA; Surgeon: Ester Rink, MD; Location: [facility] OR; Service: General Surgery; Laterality: N/A     reports that she has been smoking cigarettes. She has never used smokeless tobacco. She reports that she does not drink alcohol and does not use drugs.  Allergies  Allergen Reactions  Duloxetine Diarrhea    N/V/D   Levaquin [Levofloxacin] Other (See Comments)    hallucinations   Fluoxetine Other (See Comments)   Gabapentin Swelling   Meloxicam Swelling   Tizanidine Other (See Comments)    Family History  Problem Relation Age of Onset   Heart disease Mother     Hypertension Mother    AAA (abdominal aortic aneurysm) Mother    Stroke Father    Heart disease Father    Hypertension Father    Aneurysm Sister      Prior to Admission medications   Medication Sig Start Date End Date Taking? Authorizing Provider  acetaminophen (TYLENOL) 650 MG CR tablet Take 650 mg by mouth every 8 (eight) hours as needed for pain.    [provider]  albuterol (VENTOLIN HFA) 108 (90 Base) MCG/ACT inhaler Inhale 2 puffs into the lungs every 6 (six) hours as needed for wheezing or shortness of breath. Patient not taking: Reported on 01/17/2023 07/31/21   Phineas Semen, MD  Ascorbic Acid (VITAMIN C) 500 MG CAPS Take 500 mg by mouth. Patient not taking: Reported on 01/17/2023    [provider]  atorvastatin (LIPITOR) 40 MG tablet TAKE 1 TABLET BY MOUTH  DAILY Patient taking differently: Take 40 mg by mouth daily. 12/17/17   Steele Sizer, MD  azelastine (ASTELIN) 0.1 % nasal spray Place 1 spray into both nostrils 2 (two) times a day. 05/28/19   [provider]  b complex vitamins capsule Take 1 capsule by mouth daily.    [provider]  B Complex-C (B-COMPLEX WITH VITAMIN C) tablet Take 1 tablet by mouth daily.    [provider]  buPROPion (WELLBUTRIN XL) 150 MG 24 hr tablet Take 150 mg by mouth daily. 05/31/19   [provider]  busPIRone (BUSPAR) 5 MG tablet Take by mouth daily. 04/06/22   [provider]  celecoxib (CELEBREX) 200 MG capsule Take 1 capsule (200 mg total) by mouth 2 (two) times daily. 01/18/23   Dedra Skeens, PA-C  Cholecalciferol (VITAMIN D3) 50 MCG (2000 UT) capsule Take 2,000 Units by mouth daily.    [provider]  citalopram (CELEXA) 40 MG tablet TAKE 1 TABLET BY MOUTH  DAILY Patient taking differently: Take 40 mg by mouth daily. 12/17/17   Steele Sizer, MD  enoxaparin (LOVENOX) 40 MG/0.4ML injection Inject 0.4 mLs (40 mg total) into the skin daily for 14 days. 01/18/23 02/01/23   Dedra Skeens, PA-C  levothyroxine (SYNTHROID, LEVOTHROID) 75 MCG tablet TAKE 1 TABLET BY MOUTH  DAILY Patient taking differently: Take 75 mcg by mouth daily before breakfast. 12/17/17   Steele Sizer, MD  metFORMIN (GLUCOPHAGE) 500 MG tablet Take 500 mg by mouth 2 (two) times daily with a meal.    [provider]  omeprazole (PRILOSEC) 20 MG capsule TAKE 1 CAPSULE BY MOUTH  DAILY Patient taking differently: Take 20 mg by mouth daily. 12/17/17   Steele Sizer, MD  ondansetron (ZOFRAN) 4 MG tablet Take 1 tablet (4 mg total) by mouth every 8 (eight) hours as needed for vomiting or nausea. Patient not taking: Reported on 01/17/2023 07/31/21   Phineas Semen, MD  oxyCODONE (OXY IR/ROXICODONE) 5 MG immediate release tablet Take 1 tablet (5 mg total) by mouth every 4 (four) hours as needed for moderate pain (pain score 4-6). 01/18/23   Dedra Skeens, PA-C  pregabalin (LYRICA) 100 MG capsule Take 1 capsule by mouth 3 (three) times daily. 03/02/22   [provider]  telmisartan-hydrochlorothiazide (MICARDIS HCT) 80-12.5 MG tablet TAKE 1 TABLET BY MOUTH  DAILY 12/17/17   Steele Sizer, MD  traMADol (ULTRAM) 50 MG tablet Take 1-2 tablets (50-100 mg total) by mouth every 4 (four) hours as needed for moderate pain. 01/18/23   Dedra Skeens, PA-C  triamcinolone cream (KENALOG) 0.1 % Apply 1 Application topically 2 (two) times daily. Patient not taking: Reported on 01/17/2023    [provider]  Vitamin D, Ergocalciferol, 50 MCG (2000 UT) CAPS Take 50 mcg by mouth.    [provider]    Physical Exam: Vitals:   10/08/23 1041 10/08/23 1043 10/08/23 1505  BP:  139/75 130/70  Pulse:  89 80  Resp:  19 18  Temp:  98 F (36.7 C) 98 F (36.7 C)  TempSrc:   Oral  SpO2:  96% 96%  Weight: 77.1 kg    Height: 5\' 4"  (1.626 m)      Constitutional: NAD, calm, comfortable Vitals:   10/08/23 1041 10/08/23 1043 10/08/23 1505  BP:  139/75 130/70  Pulse:  89 80  Resp:  19 18   Temp:  98 F (36.7 C) 98 F (36.7 C)  TempSrc:   Oral  SpO2:  96% 96%  Weight: 77.1 kg    Height: 5\' 4"  (1.626 m)     Eyes: PERRL, lids and conjunctivae normal ENMT: Mucous membranes are dry. Posterior pharynx clear of any exudate or lesions.Normal dentition.  Neck: normal, supple, no masses, no thyromegaly Respiratory: clear to auscultation bilaterally, no wheezing, no crackles. Normal respiratory effort. No accessory muscle use.  Cardiovascular: Regular rate and rhythm, no murmurs / rubs / gallops. No extremity edema. 2+ pedal pulses. No carotid bruits.  Abdomen: no tenderness, no masses palpated. No hepatosplenomegaly. Bowel sounds positive.  Musculoskeletal: no clubbing / cyanosis. No joint deformity upper and lower extremities. Good ROM, no contractures. Normal muscle tone.  Skin: no rashes, lesions, ulcers. No induration Neurologic: CN 2-12 grossly intact. Sensation intact, DTR normal. Strength 5/5 in all 4.  Psychiatric: Normal judgment and insight. Alert and oriented x 3. Normal mood.    Labs on Admission: I have personally reviewed following labs and imaging studies  CBC: Recent Labs  Lab 10/08/23 1043  WBC 7.4  HGB 12.7  HCT 39.9  MCV 95.9  PLT 255   Basic Metabolic Panel: Recent Labs  Lab 10/08/23 1047  NA 134*  K 4.4  CL 105  CO2 20*  GLUCOSE 113*  BUN 16  CREATININE 1.20*  CALCIUM 9.5   GFR: Estimated Creatinine Clearance: 40.7 mL/min (A) (by C-G formula based on SCr of 1.2 mg/dL (H)). Liver Function Tests: Recent Labs  Lab 10/08/23 1047  AST 24  ALT 27  ALKPHOS 56  BILITOT 0.5  PROT 6.4*  ALBUMIN 3.8   Recent Labs  Lab 10/08/23 1043  LIPASE 34   No results for input(s): "AMMONIA" in the last 168 hours. Coagulation Profile: No results for input(s): "INR", "PROTIME" in the last 168 hours. Cardiac Enzymes: No results for input(s): "CKTOTAL", "CKMB", "CKMBINDEX", "TROPONINI" in the last 168 hours. BNP (last 3 results) No results for  input(s): "PROBNP" in the last 8760 hours. HbA1C: No results for input(s): "HGBA1C" in the last 72 hours. CBG: No results for input(s): "GLUCAP" in the last 168 hours. Lipid Profile: No results for input(s): "CHOL", "HDL", "LDLCALC", "TRIG", "CHOLHDL", "LDLDIRECT" in the last 72 hours. Thyroid Function Tests: No results for input(s): "TSH", "T4TOTAL", "FREET4", "T3FREE", "THYROIDAB" in  the last 72 hours. Anemia Panel: No results for input(s): "VITAMINB12", "FOLATE", "FERRITIN", "TIBC", "IRON", "RETICCTPCT" in the last 72 hours. Urine analysis:    Component Value Date/Time   COLORURINE AMBER (A) 01/08/2023 0915   APPEARANCEUR HAZY (A) 01/08/2023 0915   APPEARANCEUR Clear 08/24/2016 1409   LABSPEC 1.020 01/08/2023 0915   PHURINE 5.0 01/08/2023 0915   GLUCOSEU NEGATIVE 01/08/2023 0915   HGBUR NEGATIVE 01/08/2023 0915   BILIRUBINUR NEGATIVE 01/08/2023 0915   BILIRUBINUR Negative 08/24/2016 1409   KETONESUR NEGATIVE 01/08/2023 0915   PROTEINUR NEGATIVE 01/08/2023 0915   NITRITE NEGATIVE 01/08/2023 0915   LEUKOCYTESUR SMALL (A) 01/08/2023 0915    Radiological Exams on Admission: DG Chest 2 View  Result Date: 10/08/2023 CLINICAL DATA:  77 year old female with shortness of breath, chest pain, dizziness, abdominal pain since yesterday. EXAM: CHEST - 2 VIEW COMPARISON:  Chest radiographs 03/09/2022 and earlier. FINDINGS: PA and lateral views 1056 hours. Chronic large lung volumes, flattening of the diaphragm. Stable mediastinal contours with borderline to mild cardiomegaly. Visualized tracheal air column is within normal limits. Chronic coarse pulmonary interstitium is stable. No pneumothorax, pleural effusion, confluent or acute lung opacity. Scoliosis with spine degeneration. No acute osseous abnormality identified. Stable cholecystectomy clips. Negative visible bowel gas. Abdominal Calcified aortic atherosclerosis. IMPRESSION: 1. Chronic hyperinflation and pulmonary interstitial changes with  no acute cardiopulmonary abnormality. 2. Aortic Atherosclerosis (ICD10-I70.0). Electronically Signed   By: Odessa Fleming M.D.   On: 10/08/2023 12:05    EKG: Independently reviewed.  Sinus tachycardia, no acute ST changes.  Assessment/Plan Principal Problem:   Gastroenteritis Active Problems:   Intractable nausea and vomiting  (please populate well all problems here in Problem List. (For example, if patient is on BP meds at home and you resume or decide to hold them, it is a problem that needs to be her. Same for CAD, COPD, HLD and so on)  Intractable nausea vomiting diarrhea -Clinically suspect viral gastroenteritis versus food toxin -Symptoms appear to be self-limiting -Continue supportive care including IV fluid, as needed Phenergan -Agreed with PPI for GI prophylaxis -Other DDx, abdominal exam benign, low suspicion for acute abdomen or SBO.  Hold off further imaging study.  HTN Chronic HFpEF -BP stable, resume home BP meds including HCTZ/ARB -Euvolemic, no indication for diuresis  OSA -CPAP at bedtime  Anxiety/depression -Continue Wellbutrin and BuSpar  Chronic iron deficiency anemia -Getting p.o. iron supplement and periodic iron infusion with hematology.  Outpatient elective EGD and colonoscopy January 2025.  DVT prophylaxis: Lovenox Code Status: Full code Family Communication: None at bedside Disposition Plan: Expect less than 2 midnight hospital stay Consults called: None Admission status: MedSurg observation   Emeline General MD [facility] Pager [phone]  10/08/2023, 5:28 PM

## 2023-10-08 NOTE — ED Provider Notes (Signed)
Emergency department handoff note  Care of this patient was signed out to me at the end of the previous provider shift.  All pertinent patient information was conveyed and all questions were answered.  Patient pending evaluation for p.o. tolerance following antiemetic treatment.  Patient continues to be p.o. intolerant with continued gagging and retching despite no p.o. challenge.  Given this persistent nausea/vomiting, patient will require admission to the internal medicine service for further evaluation and management.  I spoke to the on-call hospitalist, Dr. Chipper Herb, who agrees to accept this patient onto his service for further evaluation and management.  Dispo: Admit to medicine   Merwyn Katos, MD 10/08/23 1728

## 2023-10-08 NOTE — ED Notes (Addendum)
States she is still nauseated  States the medication has not helped   Pt has not vomited  Sitting up in chair  Family at bedside

## 2023-10-08 NOTE — ED Triage Notes (Signed)
Pt comes with c/o sob, cp, dizziness, belly pain. Nausea nd vomiting. Pt states this all started yesterday. Pt not sure if she is dehydrated. Pt states she just feels so weak.

## 2023-10-08 NOTE — ED Provider Notes (Signed)
[facility] Provider Note    Event Date/Time   First MD Initiated Contact with Patient 10/08/23 1118     (approximate)   History   Nausea vomiting, coughing   HPI  Sherri Peters is a 77 y.o. female with history of CKD, COPD, CAD, fibromyalgia, anxiety who presents with nausea, coughing, loose stools.  She reports this has been ongoing intermittently over the last year, she is seeing GI for endoscopy.  Reports this is been ongoing for about 24 hours now.  Typically improves with IV nausea medications     Physical Exam   Triage Vital Signs: ED Triage Vitals  Encounter Vitals Group     BP 10/08/23 1043 139/75     Systolic BP Percentile --      Diastolic BP Percentile --      Pulse Rate 10/08/23 1043 89     Resp 10/08/23 1043 19     Temp 10/08/23 1043 98 F (36.7 C)     Temp src --      SpO2 10/08/23 1043 96 %     Weight 10/08/23 1041 77.1 kg (170 lb)     Height 10/08/23 1041 1.626 m (5\' 4" )     Head Circumference --      Peak Flow --      Pain Score 10/08/23 1041 5     Pain Loc --      Pain Education --      Exclude from Growth Chart --     Most recent vital signs: Vitals:   10/08/23 1043 10/08/23 1505  BP: 139/75 130/70  Pulse: 89 80  Resp: 19 18  Temp: 98 F (36.7 C) 98 F (36.7 C)  SpO2: 96% 96%     General: Awake, no distress.  CV:  Good peripheral perfusion.  Resp:  Normal effort.  Abd:  No distention.  Soft, nontender Other:     ED Results / Procedures / Treatments   Labs (all labs ordered are listed, but only abnormal results are displayed) Labs Reviewed  CBC - Abnormal; Notable for the following components:      Result Value   RDW 16.8 (*)    All other components within normal limits  COMPREHENSIVE METABOLIC PANEL - Abnormal; Notable for the following components:   Sodium 134 (*)    CO2 20 (*)    Glucose, Bld 113 (*)    Creatinine, Ser 1.20 (*)    Total Protein 6.4 (*)    GFR, Estimated 47 (*)    All other  components within normal limits  LIPASE, BLOOD  TROPONIN I (HIGH SENSITIVITY)     EKG  ED ECG REPORT I, Jene Every, the attending physician, personally viewed and interpreted this ECG.  Date: 10/08/2023  Rhythm: normal sinus rhythm QRS Axis: normal Intervals: normal ST/T Wave abnormalities: normal Narrative Interpretation: no evidence of acute ischemia    RADIOLOGY Chest x-ray viewed interpret by me, no acute abnormality    PROCEDURES:  Critical Care performed:   Procedures   MEDICATIONS ORDERED IN ED: Medications  sodium chloride 0.9 % bolus 500 mL (0 mLs Intravenous Stopped 10/08/23 1300)  ondansetron (ZOFRAN) injection 4 mg (4 mg Intravenous Given 10/08/23 1216)  pantoprazole (PROTONIX) injection 40 mg (40 mg Intravenous Given 10/08/23 1216)  promethazine (PHENERGAN) 12.5 mg in sodium chloride 0.9 % 50 mL IVPB (0 mg Intravenous Stopped 10/08/23 1424)  LORazepam (ATIVAN) injection 0.5 mg (0.5 mg Intravenous Given 10/08/23 1510)  acetaminophen (TYLENOL) tablet  650 mg (650 mg Oral Given 10/08/23 1512)     IMPRESSION / MDM / ASSESSMENT AND PLAN / ED COURSE  I reviewed the triage vital signs and the nursing notes. Patient's presentation is most consistent with severe exacerbation of chronic illness.  Patient presents with nausea vomiting as detailed above, no abdominal pain at this time.  She is having loose stools as well.  Differential includes cyclical vomiting, gastroenteritis, viral illness.  Overall well-appearing and in no acute distress, will treat with IV Zofran, IV fluids and reevaluate  Chest x-ray is unremarkable, reviewed CT scan results from July 06, 2023 which was unremarkable  Lab work today is reassuring.  Patient still having nausea after Zofran, will try IV Phenergan  ----------------------------------------- 3:06 PM on 10/08/2023 ----------------------------------------- Patient still complaining of nausea and mild headache, will try IV Ativan  for nausea and anxiety as well as Tylenol for headache  Have asked my colleague to reevaluate, if no improvement patient Reach quire admission      FINAL CLINICAL IMPRESSION(S) / ED DIAGNOSES   Final diagnoses:  Nausea and vomiting, unspecified vomiting type     Rx / DC Orders   ED Discharge Orders     None        Note:  This document was prepared using Dragon voice recognition software and [employer] include unintentional dictation errors.   Jene Every, MD 10/08/23 [phone]

## 2023-10-08 NOTE — ED Notes (Signed)
Up to bathroom  states she is still nauseated

## 2023-10-09 DIAGNOSIS — N1831 Chronic kidney disease, stage 3a: Secondary | ICD-10-CM

## 2023-10-09 DIAGNOSIS — G4733 Obstructive sleep apnea (adult) (pediatric): Secondary | ICD-10-CM

## 2023-10-09 DIAGNOSIS — E871 Hypo-osmolality and hyponatremia: Secondary | ICD-10-CM | POA: Insufficient documentation

## 2023-10-09 DIAGNOSIS — K529 Noninfective gastroenteritis and colitis, unspecified: Secondary | ICD-10-CM | POA: Diagnosis not present

## 2023-10-09 DIAGNOSIS — E872 Acidosis, unspecified: Secondary | ICD-10-CM | POA: Insufficient documentation

## 2023-10-09 LAB — URINALYSIS, W/ REFLEX TO CULTURE (INFECTION SUSPECTED)
Bilirubin Urine: NEGATIVE
Glucose, UA: NEGATIVE mg/dL
Ketones, ur: NEGATIVE mg/dL
Leukocytes,Ua: NEGATIVE
Nitrite: NEGATIVE
Protein, ur: NEGATIVE mg/dL
Specific Gravity, Urine: 1.008 (ref 1.005–1.030)
pH: 5 (ref 5.0–8.0)

## 2023-10-09 LAB — BASIC METABOLIC PANEL
Anion gap: 6 (ref 5–15)
BUN: 12 mg/dL (ref 8–23)
CO2: 21 mmol/L — ABNORMAL LOW (ref 22–32)
Calcium: 9.1 mg/dL (ref 8.9–10.3)
Chloride: 108 mmol/L (ref 98–111)
Creatinine, Ser: 0.99 mg/dL (ref 0.44–1.00)
GFR, Estimated: 59 mL/min — ABNORMAL LOW (ref >60)
Glucose, Bld: 92 mg/dL (ref 70–99)
Potassium: 3.8 mmol/L (ref 3.5–5.1)
Sodium: 135 mmol/L (ref 135–145)

## 2023-10-09 MED ORDER — CITALOPRAM HYDROBROMIDE 20 MG PO TABS: 20.0000 mg | ORAL_TABLET | Freq: Every day | ORAL | Status: DC

## 2023-10-09 MED ORDER — ONDANSETRON HCL 4 MG PO TABS
4.0000 mg | ORAL_TABLET | Freq: Three times a day (TID) | ORAL | Status: DC | PRN
  Administered 2023-10-09: 4 mg via ORAL
  Filled 2023-10-09: qty 1

## 2023-10-09 MED ORDER — ONDANSETRON HCL 4 MG PO TABS: 4.0000 mg | ORAL_TABLET | Freq: Three times a day (TID) | ORAL | 0 refills | Status: DC | PRN

## 2023-10-09 MED ORDER — PANTOPRAZOLE SODIUM 40 MG PO TBEC: 40.0000 mg | DELAYED_RELEASE_TABLET | Freq: Two times a day (BID) | ORAL | 0 refills | Status: AC

## 2023-10-09 NOTE — Discharge Summary (Signed)
Physician Discharge Summary   Patient: Sherri Peters MRN: [id number] DOB: 06/14/1946  Admit date:     10/08/2023  Discharge date: 10/09/23  Discharge Physician: Marrion Coy   PCP: Ardyth Man, PA-C   Recommendations at discharge:   Follow-up PCP in 1 week.  Discharge Diagnoses: Principal Problem:   Gastroenteritis Active Problems:   Hypothyroid   Hypertension   OSA on CPAP   Intractable nausea and vomiting   Bilateral carotid artery stenosis   Chronic kidney disease, stage 3a (HCC)   Hyponatremia   Metabolic acidosis Chronic iron deficient anemia. Overweight BMI 29.18. Resolved Problems:   * No resolved hospital problems. [facility] Course: Sherri Peters is a 77 y.o. female with medical history significant of HTN, COPD, OSA on CPAP HS, chronic HFpEF, GERD, restless leg syndrome on pregabalin, scented with sudden onset of nausea vomiting diarrhea.  She has some mild hyponatremia, slightly worsening renal function.  She was given IV fluids, symptomatic treatment.  Condition had improved, currently she is medically stable for discharge. Will continue some Zofran as needed.  Also increase Protonix to 40 mg twice a day.  Hold off metformin due to diarrhea.  Patient advised to follow-up with PCP in 1 week.        Consultants: None Procedures performed: None  Disposition: Home Diet recommendation:  Discharge Diet Orders (From admission, onward)     Start     Ordered   10/09/23 0000  Diet - low sodium heart healthy        10/09/23 1226           Cardiac diet DISCHARGE MEDICATION: Allergies as of 10/09/2023       Reactions   Duloxetine Diarrhea   N/V/D   Levaquin [levofloxacin] Other (See Comments)   hallucinations   Fluoxetine Other (See Comments)   Gabapentin Swelling   Meloxicam Swelling   Tizanidine Other (See Comments)        Medication List     STOP taking these medications    azelastine 0.1 % nasal spray Commonly known as: ASTELIN   b complex  vitamins capsule   metFORMIN 500 MG 24 hr tablet Commonly known as: GLUCOPHAGE-XR   Vitamin C 500 MG Caps   Vitamin D (Ergocalciferol) 50 MCG (2000 UT) Caps       TAKE these medications    acetaminophen 650 MG CR tablet Commonly known as: TYLENOL Take 650 mg by mouth every 8 (eight) hours as needed for pain.   albuterol 108 (90 Base) MCG/ACT inhaler Commonly known as: VENTOLIN HFA Inhale 2 puffs into the lungs every 6 (six) hours as needed for wheezing or shortness of breath.   atorvastatin 40 MG tablet Commonly known as: LIPITOR TAKE 1 TABLET BY MOUTH  DAILY   B-complex with vitamin C tablet Take 1 tablet by mouth daily.   buPROPion 150 MG 24 hr tablet Commonly known as: WELLBUTRIN XL Take 150 mg by mouth daily.   busPIRone 5 MG tablet Commonly known as: BUSPAR Take by mouth daily.   citalopram 20 MG tablet Commonly known as: CELEXA Take 20 mg by mouth daily.   levothyroxine 75 MCG tablet Commonly known as: SYNTHROID TAKE 1 TABLET BY MOUTH  DAILY What changed: when to take this   ondansetron 4 MG tablet Commonly known as: ZOFRAN Take 1 tablet (4 mg total) by mouth every 8 (eight) hours as needed for nausea or vomiting.   pantoprazole 40 MG tablet Commonly known as: PROTONIX Take 1 tablet (  40 mg total) by mouth 2 (two) times daily for 7 days. What changed: when to take this   pregabalin 100 MG capsule Commonly known as: LYRICA Take 1 capsule by mouth 3 (three) times daily.   telmisartan-hydrochlorothiazide 80-12.5 MG tablet Commonly known as: MICARDIS HCT TAKE 1 TABLET BY MOUTH  DAILY   triamcinolone cream 0.1 % Commonly known as: KENALOG Apply 1 Application topically 2 (two) times daily.   Vitamin D3 50 MCG (2000 UT) capsule Take 2,000 Units by mouth daily.        Follow-up Information     Ardyth Man, PA-C Follow up in 1 week(s).   Specialty: Family Medicine Contact information: [address] [place]  [address] [phone]                Discharge Exam: Ceasar Mons Weights   10/08/23 1041  Weight: 77.1 kg   General exam: Appears calm and comfortable  Respiratory system: Clear to auscultation. Respiratory effort normal. Cardiovascular system: S1 & S2 heard, RRR. No JVD, murmurs, rubs, gallops or clicks. No pedal edema. Gastrointestinal system: Abdomen is nondistended, soft and nontender. No organomegaly or masses felt. Normal bowel sounds heard. Central nervous system: Alert and oriented. No focal neurological deficits. Extremities: Symmetric 5 x 5 power. Skin: No rashes, lesions or ulcers Psychiatry: Judgement and insight appear normal. Mood & affect appropriate.    Condition at discharge: good  The results of significant diagnostics from this hospitalization (including imaging, microbiology, ancillary and laboratory) are listed below for reference.   Imaging Studies: DG Chest 2 View  Result Date: 10/08/2023 CLINICAL DATA:  77 year old female with shortness of breath, chest pain, dizziness, abdominal pain since yesterday. EXAM: CHEST - 2 VIEW COMPARISON:  Chest radiographs 03/09/2022 and earlier. FINDINGS: PA and lateral views 1056 hours. Chronic large lung volumes, flattening of the diaphragm. Stable mediastinal contours with borderline to mild cardiomegaly. Visualized tracheal air column is within normal limits. Chronic coarse pulmonary interstitium is stable. No pneumothorax, pleural effusion, confluent or acute lung opacity. Scoliosis with spine degeneration. No acute osseous abnormality identified. Stable cholecystectomy clips. Negative visible bowel gas. Abdominal Calcified aortic atherosclerosis. IMPRESSION: 1. Chronic hyperinflation and pulmonary interstitial changes with no acute cardiopulmonary abnormality. 2. Aortic Atherosclerosis (ICD10-I70.0). Electronically Signed   By: Odessa Fleming M.D.   On: 10/08/2023 12:05    Microbiology: Results for orders placed or performed during the  hospital encounter of 01/08/23  Surgical pcr screen     Status: None   Collection Time: 01/08/23  9:15 AM   Specimen: Nasal Mucosa; Nasal Swab  Result Value Ref Range Status   MRSA, PCR NEGATIVE NEGATIVE Final   Staphylococcus aureus NEGATIVE NEGATIVE Final    Comment: (NOTE) The Xpert SA Assay (FDA approved for NASAL specimens in patients 49 years of age and older), is one component of a comprehensive surveillance program. It is not intended to diagnose infection nor to guide or monitor treatment. Performed at [facility], [address] [place], [place] [address]     Labs: CBC: Recent Labs  Lab 10/08/23 1043  WBC 7.4  HGB 12.7  HCT 39.9  MCV 95.9  PLT 255   Basic Metabolic Panel: Recent Labs  Lab 10/08/23 1047 10/09/23 0438  NA 134* 135  K 4.4 3.8  CL 105 108  CO2 20* 21*  GLUCOSE 113* 92  BUN 16 12  CREATININE 1.20* 0.99  CALCIUM 9.5 9.1   Liver Function Tests: Recent Labs  Lab 10/08/23 1047  AST 24  ALT 27  ALKPHOS 56  BILITOT 0.5  PROT 6.4*  ALBUMIN 3.8   CBG: No results for input(s): "GLUCAP" in the last 168 hours.  Discharge time spent: greater than 30 minutes.  Signed: Marrion Coy, MD [facility] 10/09/2023

## 2023-12-05 ENCOUNTER — Ambulatory Visit: Admit: 2023-12-05 | Payer: Medicare PPO | Admitting: Internal Medicine

## 2023-12-05 SURGERY — COLONOSCOPY
Anesthesia: General

## 2024-01-09 ENCOUNTER — Ambulatory Visit
Admission: EM | Admit: 2024-01-09 | Discharge: 2024-01-09 | Disposition: A | Discharge status: Home / Self Care | Attending: Physician Assistant | Admitting: Physician Assistant

## 2024-01-09 DIAGNOSIS — R112 Nausea with vomiting, unspecified: Secondary | ICD-10-CM | POA: Diagnosis not present

## 2024-01-09 DIAGNOSIS — I10 Essential (primary) hypertension: Secondary | ICD-10-CM | POA: Diagnosis present

## 2024-01-09 DIAGNOSIS — R350 Frequency of micturition: Secondary | ICD-10-CM | POA: Diagnosis present

## 2024-01-09 DIAGNOSIS — Z760 Encounter for issue of repeat prescription: Secondary | ICD-10-CM | POA: Insufficient documentation

## 2024-01-09 DIAGNOSIS — R0981 Nasal congestion: Secondary | ICD-10-CM | POA: Insufficient documentation

## 2024-01-09 DIAGNOSIS — K219 Gastro-esophageal reflux disease without esophagitis: Secondary | ICD-10-CM | POA: Insufficient documentation

## 2024-01-09 LAB — URINALYSIS, W/ REFLEX TO CULTURE (INFECTION SUSPECTED)
Bilirubin Urine: NEGATIVE
Glucose, UA: NEGATIVE mg/dL
Hgb urine dipstick: NEGATIVE
Ketones, ur: NEGATIVE mg/dL
Leukocytes,Ua: NEGATIVE
Nitrite: NEGATIVE
Protein, ur: NEGATIVE mg/dL
RBC / HPF: NONE SEEN RBC/hpf (ref 0–5)
Specific Gravity, Urine: 1.01 (ref 1.005–1.030)
pH: 5.5 (ref 5.0–8.0)

## 2024-01-09 MED ORDER — PREGABALIN 100 MG PO CAPS: 100.0000 mg | ORAL_CAPSULE | Freq: Three times a day (TID) | ORAL | 0 refills | Status: DC

## 2024-01-09 MED ORDER — ONDANSETRON 8 MG PO TBDP
8.0000 mg | ORAL_TABLET | Freq: Once | ORAL | Status: AC
Start: 2024-01-09 — End: 2024-01-09
  Administered 2024-01-09: 8 mg via ORAL

## 2024-01-09 MED ORDER — ONDANSETRON 4 MG PO TBDP: 4.0000 mg | ORAL_TABLET | Freq: Four times a day (QID) | ORAL | 0 refills | Status: DC | PRN

## 2024-01-09 NOTE — Discharge Instructions (Addendum)
-  The urine is not consistent with UTI. - Increase fluid intake and rest.  I sent nausea medicine to pharmacy. - I refilled your Lyrica for the next week. - If you develop fever or worsening respiratory symptoms please return for reevaluation. - Go to the ER if you feel that your weakness or fatigue acutely worsen.

## 2024-01-09 NOTE — ED Triage Notes (Signed)
Pt c/o urinary freq x1 day. Denies any burning,pain or hematuria.

## 2024-01-09 NOTE — ED Provider Notes (Signed)
 [facility]    CSN: [id number] Arrival date & time: 01/09/24  1511      History   Chief Complaint Chief Complaint  Patient presents with   Dysuria    HPI Sherri Peters is a 78 y.o. female with history of emphysema, GERD, OSA on CPAP, hypothyroidism, hyperlipidemia, hypertension, depression, CAD, stage III CKD, restless leg syndrome, fibromyalgia, osteoarthritis, prediabetes, obesity and scoliosis.  Patient presents with her sister today for fatigue, urinary frequency for the past couple of days.  Also reports urgency and incontinence.  Denies dysuria, hematuria, vaginal discharge/itching or odor, abdominal pain.  No increase in chronic back pain.  Reports nausea and 1 episode of vomiting, postnasal drainage and nasal congestion that began today.  Denies any recent diarrhea but had diarrhea last week and had an episode of fecal incontinence last week.  She is concerned that could have caused her to have a UTI.  Patient denies fever, cough, sore throat, chest pain, wheezing, shortness of breath.  No known COVID or flu exposure.  Patient requests 7-day prescription of Lyrica which she takes for restless leg syndrome.  States her mail order pharmacy cannot deliver the medication for another 5 to 7 days and she does not want another medication.  She also request Zofran for her nausea.  No other complaints.   HPI  Past Medical History:  Diagnosis Date   Aortic atherosclerosis (HCC)    Arthritis    Bilateral carotid artery disease (HCC)    Carpal tunnel syndrome of left wrist    CKD (chronic kidney disease), stage II    COPD (chronic obstructive pulmonary disease) (HCC)    Coronary artery disease    Depression    Diastolic dysfunction    a.) TTE 11/15/2018: EF >55%, mild LVH, MAC, BAE, triv PR, mild MR/TR, G1DD; b.) TTE 09/12/2021: EF >55%, mild LVH, MAC, triv TR/PR, mod MR, G1DD   Endophthalmitis, right    Fibromyalgia    GERD (gastroesophageal reflux disease)    History  of bilateral cataract extraction 06/2018   Hyperlipidemia    Hypertension    Hypothyroidism    OSA on CPAP    Pre-diabetes    RLS (restless legs syndrome)    Statin intolerance    Umbilical hernia    a.) s/p repair 11/10/2016    Patient Active Problem List   Diagnosis Date Noted   Hyponatremia 10/09/2023   Metabolic acidosis 10/09/2023   Gastroenteritis 10/08/2023   Total knee replacement status 01/17/2023   Degeneration of lumbar intervertebral disc 12/16/2022   Scoliosis of lumbar spine 12/16/2022   Primary osteoarthritis of left knee 12/02/2022   Severe obesity (BMI 35.0-39.9) with comorbidity (HCC) 12/02/2022   Arthritis of right hip 07/04/2022   Low back pain 07/04/2022   Sacroiliac joint pain 07/04/2022   Arthropathy of lumbar facet joint 07/04/2022   Lumbar radiculopathy 07/04/2022   Lumbar spondylosis 07/04/2022   Spondylolisthesis, lumbar region 07/04/2022   Prediabetes 05/26/2022   Primary hypertension 05/15/2022   Coronary artery disease involving native coronary artery of native heart 08/18/2021   Fibromyalgia 05/05/2021   CPAP use counseling 03/14/2021   Secondary hyperparathyroidism (HCC) 02/15/2021   Tobacco use 02/15/2021   Primary osteoarthritis of both first carpometacarpal joints 11/09/2019   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/18/2019   Intractable nausea and vomiting 06/02/2019   Bilateral carotid artery stenosis 11/22/2018   SOBOE (shortness of breath on exertion) 11/22/2018   Endophthalmitis, right eye 07/09/2018   Pseudophakia of right  eye 07/09/2018   Current moderate episode of major depressive disorder without prior episode (HCC) 01/02/2018   Primary osteoarthritis of right knee 12/07/2017   Chronic kidney disease, stage 3a (HCC) 11/22/2017   Gastroesophageal reflux disease without esophagitis 11/22/2017   Obesity (BMI 35.0-39.9 without comorbidity) 11/22/2017   Other emphysema (HCC) 11/22/2017   Age-related osteoporosis without  current pathological fracture 10/18/2016   Bilateral chronic knee pain 10/18/2016   Chronic thumb pain, bilateral 10/18/2016   Carpal tunnel syndrome of left wrist 06/07/2016   Restless leg syndrome 12/14/2015   Arthritis of knee 12/14/2015   Shingles 08/23/2015   OSA on CPAP 05/18/2015   Hyperlipidemia    Hypothyroid    Hypertension    Depression     Past Surgical History:  Procedure Laterality Date   ABDOMINAL HYSTERECTOMY     APPENDECTOMY N/A    CATARACT EXTRACTION Bilateral 06/2018   CHOLECYSTECTOMY N/A 12/08/2019   COLONOSCOPY     KNEE ARTHROPLASTY Left 01/17/2023   Procedure: COMPUTER ASSISTED TOTAL KNEE ARTHROPLASTY;  Surgeon: Donato Heinz, MD;  Location: [facility] ORS;  Service: Orthopedics;  Laterality: Left;   TONSILLECTOMY Bilateral    TOTAL KNEE ARTHROPLASTY Right 06/03/2020   ARTHROPLASTY, KNEE, CONDYLE AND PLATEAU; MEDIAL AND LATERAL COMPARTMENTSWITH OR WITHOUT PATELLA RESURFACING (TOTAL KNEE ARTHROPLASTY); Surgeon: Currie Paris, MD; Location: [facility] OR; Service: Orthopedics; Laterality: Right;   UMBILICAL HERNIA REPAIR  11/10/2016   LAPAROSCOPY, SURGICAL; REPAIR, UMBILICAL HERNIA (INCLUDES MESH INSERTION, WHEN PERFORMED); REDUCIBLE - laparoscopic umbilical hernia repair. GETA; Surgeon: Ester Rink, MD; Location: [facility] OR; Service: General Surgery; Laterality: N/A    OB History   No obstetric history on file.      Home Medications    Prior to Admission medications   Medication Sig Start Date End Date Taking? Authorizing Provider  acetaminophen (TYLENOL) 650 MG CR tablet Take 650 mg by mouth every 8 (eight) hours as needed for pain.   Yes [provider]  albuterol (VENTOLIN HFA) 108 (90 Base) MCG/ACT inhaler Inhale 2 puffs into the lungs every 6 (six) hours as needed for wheezing or shortness of breath. 07/31/21  Yes Phineas Semen, MD  atorvastatin (LIPITOR) 40 MG tablet TAKE 1 TABLET BY MOUTH  DAILY Patient  taking differently: Take 40 mg by mouth daily. 12/17/17  Yes Crissman, Redge Gainer, MD  B Complex-C (B-COMPLEX WITH VITAMIN C) tablet Take 1 tablet by mouth daily.   Yes [provider]  buPROPion (WELLBUTRIN XL) 150 MG 24 hr tablet Take 150 mg by mouth daily. 05/31/19  Yes [provider]  busPIRone (BUSPAR) 5 MG tablet Take by mouth daily. 04/06/22  Yes [provider]  Cholecalciferol (VITAMIN D3) 50 MCG (2000 UT) capsule Take 2,000 Units by mouth daily.   Yes [provider]  citalopram (CELEXA) 20 MG tablet Take 20 mg by mouth daily.   Yes [provider]  levothyroxine (SYNTHROID, LEVOTHROID) 75 MCG tablet TAKE 1 TABLET BY MOUTH  DAILY Patient taking differently: Take 75 mcg by mouth daily before breakfast. 12/17/17  Yes Crissman, Redge Gainer, MD  metFORMIN (GLUCOPHAGE-XR) 500 MG 24 hr tablet Take 500 mg by mouth daily. 12/13/23  Yes [provider]  ondansetron (ZOFRAN-ODT) 4 MG disintegrating tablet Take 1 tablet (4 mg total) by mouth every 6 (six) hours as needed for nausea or vomiting. 01/09/24  Yes Eusebio Friendly B, PA-C  pregabalin (LYRICA) 100 MG capsule Take 1 capsule by mouth 3 (three) times daily. 03/02/22  Yes [provider]  pregabalin (LYRICA) 100 MG capsule Take 1 capsule (100 mg total) by mouth 3 (three) times daily for 7 days. 01/09/24 01/16/24 Yes Shirlee Latch, PA-C  telmisartan-hydrochlorothiazide (MICARDIS HCT) 80-12.5 MG tablet TAKE 1 TABLET BY MOUTH  DAILY 12/17/17  Yes Crissman, Redge Gainer, MD  pantoprazole (PROTONIX) 40 MG tablet Take 1 tablet (40 mg total) by mouth 2 (two) times daily for 7 days. 10/09/23 10/16/23  Marrion Coy, MD  triamcinolone cream (KENALOG) 0.1 % Apply 1 Application topically 2 (two) times daily. Patient not taking: Reported on 01/17/2023    [provider]    Family History Family History  Problem Relation Age of Onset   Heart disease Mother    Hypertension Mother    AAA (abdominal aortic  aneurysm) Mother    Stroke Father    Heart disease Father    Hypertension Father    Aneurysm Sister     Social History Social History   Tobacco Use   Smoking status: Every Day    Current packs/day: 0.00    Types: Cigarettes    Last attempt to quit: 07/20/2016    Years since quitting: 7.4   Smokeless tobacco: Never  Vaping Use   Vaping status: Never Used  Substance Use Topics   Alcohol use: No   Drug use: No     Allergies   Duloxetine, Levaquin [levofloxacin], Fluoxetine, Gabapentin, Meloxicam, and Tizanidine   Review of Systems Review of Systems  Constitutional:  Positive for fatigue. Negative for chills and fever.  HENT:  Positive for congestion, postnasal drip and rhinorrhea. Negative for sinus pain and sore throat.   Respiratory:  Negative for cough and shortness of breath.   Cardiovascular:  Negative for chest pain.  Gastrointestinal:  Negative for abdominal pain, diarrhea, nausea and vomiting.  Genitourinary:  Positive for frequency and urgency. Negative for decreased urine volume, dysuria, flank pain, hematuria, pelvic pain, vaginal bleeding, vaginal discharge and vaginal pain.  Musculoskeletal:  Negative for back pain.  Skin:  Negative for rash.  Neurological:  Negative for dizziness, syncope and headaches.     Physical Exam Triage Vital Signs ED Triage Vitals  Encounter Vitals Group     BP 01/09/24 1518 (!) 154/83     Systolic BP Percentile --      Diastolic BP Percentile --      Pulse Rate 01/09/24 1518 88     Resp 01/09/24 1518 16     Temp 01/09/24 1518 98.6 F (37 C)     Temp Source 01/09/24 1518 Oral     SpO2 01/09/24 1518 94 %     Weight 01/09/24 1517 185 lb (83.9 kg)     Height 01/09/24 1517 5\' 3"  (1.6 m)     Head Circumference --      Peak Flow --      Pain Score 01/09/24 1521 0     Pain Loc --      Pain Education --      Exclude from Growth Chart --    No data found.  Updated Vital Signs BP (!) 154/83 (BP Location: Right Arm)   Pulse  88   Temp 98.6 F (37 C) (Oral)   Resp 16   Ht 5\' 3"  (1.6 m)   Wt 185 lb (83.9 kg)   SpO2 94%   BMI 32.77 kg/m      Physical Exam Vitals and nursing note reviewed.  Constitutional:      General: She is not in  acute distress.    Appearance: Normal appearance. She is not ill-appearing or toxic-appearing.     Comments: Patient is in a night gown, sitting in wheelchair  HENT:     Head: Normocephalic and atraumatic.     Nose: Congestion present.     Mouth/Throat:     Mouth: Mucous membranes are moist.     Pharynx: Oropharynx is clear. No posterior oropharyngeal erythema.  Eyes:     General: No scleral icterus.       Right eye: No discharge.        Left eye: No discharge.     Conjunctiva/sclera: Conjunctivae normal.  Cardiovascular:     Rate and Rhythm: Normal rate and regular rhythm.     Heart sounds: Normal heart sounds.  Pulmonary:     Effort: Pulmonary effort is normal. No respiratory distress.     Breath sounds: Normal breath sounds.  Abdominal:     Palpations: Abdomen is soft.     Tenderness: There is no abdominal tenderness. There is no right CVA tenderness or left CVA tenderness.  Musculoskeletal:     Cervical back: Neck supple.  Skin:    General: Skin is dry.  Neurological:     General: No focal deficit present.     Mental Status: She is alert. Mental status is at baseline.     Motor: No weakness.  Psychiatric:        Mood and Affect: Mood normal.      UC Treatments / Results  Labs (all labs ordered are listed, but only abnormal results are displayed) Labs Reviewed  URINALYSIS, W/ REFLEX TO CULTURE (INFECTION SUSPECTED) - Abnormal; Notable for the following components:      Result Value   Bacteria, UA RARE (*)    All other components within normal limits    EKG   Radiology No results found.  Procedures Procedures (including critical care time)  Medications Ordered in UC Medications  ondansetron (ZOFRAN-ODT) disintegrating tablet 8 mg (8 mg  Oral Given 01/09/24 1552)    Initial Impression / Assessment and Plan / UC Course  I have reviewed the triage vital signs and the nursing notes.  Pertinent labs & imaging results that were available during my care of the patient were reviewed by me and considered in my medical decision making (see chart for details).   78 year old female presents for fatigue, urinary frequency/urgency, nausea with an episode of vomiting, postnasal drainage and congestion that began in the past 24 hours.  Denies fever, cough, sore throat, chest pain, wheezing, shortness of breath, abdominal pain, flank pain, hematuria or vaginal complaints.  BP elevated at 154/83.  Other vitals normal and stable.  She is overall well-appearing.  No acute distress.  Resting comfortably in a wheelchair.  On exam has mild nasal congestion.  Throat is clear.  Chest clear to auscultation heart regular rate and rhythm.  Abdomen soft and nontender.  No CVA tenderness.  Urinalysis obtained.  Patient given 8 mg ODT Zofran for nausea.  Patient did request a refill of Lyrica for 7 days until she can receive her mail order prescription.  Refilled this prescription for her.  Urinalysis is clear.  No sign of UTI and patient does not appear to be dehydrated.  Advised her these results.  Encouraged continuing to stay hydrated and rested.  Diesel be developing an upper respiratory infection or allergy type symptoms.  Encouraged use of decongestants.  Advised to return if fever or worsening symptoms especially if worsening  cough or breathing problems since she has a history of COPD.  Keep an eye on blood pressure.  If consistently greater than 140/90 should talk to PCP about medication dose adjustment.  Patient has an upcoming appointment with GI next month for an endoscopy because she has had recurrent episodes of nausea and vomiting.  States Zofran normally helps so I sent this medication to pharmacy.  Continue pantoprazole. ED precautions  reviewed.   Final Clinical Impressions(s) / UC Diagnoses   Final diagnoses:  Urinary frequency  Nasal congestion  Nausea and vomiting, unspecified vomiting type  Medication refill  Gastroesophageal reflux disease, unspecified whether esophagitis present  Essential hypertension     Discharge Instructions      -The urine is not consistent with UTI. - Increase fluid intake and rest.  I sent nausea medicine to pharmacy. - I refilled your Lyrica for the next week. - If you develop fever or worsening respiratory symptoms please return for reevaluation. - Go to the ER if you feel that your weakness or fatigue acutely worsen.     ED Prescriptions     Medication Sig Dispense Auth. Provider   pregabalin (LYRICA) 100 MG capsule Take 1 capsule (100 mg total) by mouth 3 (three) times daily for 7 days. 21 capsule Eusebio Friendly B, PA-C   ondansetron (ZOFRAN-ODT) 4 MG disintegrating tablet Take 1 tablet (4 mg total) by mouth every 6 (six) hours as needed for nausea or vomiting. 15 tablet Shirlee Latch, PA-C      I have reviewed the PDMP during this encounter.   Shirlee Latch, PA-C 01/09/24 [phone]

## 2024-01-29 ENCOUNTER — Encounter: Payer: Self-pay | Admitting: Internal Medicine

## 2024-02-05 ENCOUNTER — Ambulatory Visit: Admitting: Anesthesiology

## 2024-02-05 ENCOUNTER — Ambulatory Visit
Admission: RE | Admit: 2024-02-05 | Discharge: 2024-02-05 | Disposition: A | Discharge status: Home / Self Care | Payer: Medicare PPO | Attending: Internal Medicine | Admitting: Internal Medicine

## 2024-02-05 ENCOUNTER — Encounter: Payer: Self-pay | Admitting: Internal Medicine

## 2024-02-05 ENCOUNTER — Encounter
Admission: RE | Disposition: A | Discharge status: Home / Self Care | Payer: Self-pay | Source: Home / Self Care | Attending: Internal Medicine

## 2024-02-05 DIAGNOSIS — I129 Hypertensive chronic kidney disease with stage 1 through stage 4 chronic kidney disease, or unspecified chronic kidney disease: Secondary | ICD-10-CM | POA: Diagnosis not present

## 2024-02-05 DIAGNOSIS — J449 Chronic obstructive pulmonary disease, unspecified: Secondary | ICD-10-CM | POA: Diagnosis not present

## 2024-02-05 DIAGNOSIS — D122 Benign neoplasm of ascending colon: Secondary | ICD-10-CM | POA: Insufficient documentation

## 2024-02-05 DIAGNOSIS — K640 First degree hemorrhoids: Secondary | ICD-10-CM | POA: Diagnosis not present

## 2024-02-05 DIAGNOSIS — M797 Fibromyalgia: Secondary | ICD-10-CM | POA: Insufficient documentation

## 2024-02-05 DIAGNOSIS — R7303 Prediabetes: Secondary | ICD-10-CM | POA: Diagnosis not present

## 2024-02-05 DIAGNOSIS — I700 Atherosclerosis of aorta: Secondary | ICD-10-CM | POA: Insufficient documentation

## 2024-02-05 DIAGNOSIS — R194 Change in bowel habit: Secondary | ICD-10-CM | POA: Insufficient documentation

## 2024-02-05 DIAGNOSIS — K5730 Diverticulosis of large intestine without perforation or abscess without bleeding: Secondary | ICD-10-CM | POA: Diagnosis not present

## 2024-02-05 DIAGNOSIS — I2510 Atherosclerotic heart disease of native coronary artery without angina pectoris: Secondary | ICD-10-CM | POA: Diagnosis not present

## 2024-02-05 DIAGNOSIS — D120 Benign neoplasm of cecum: Secondary | ICD-10-CM | POA: Diagnosis not present

## 2024-02-05 DIAGNOSIS — N182 Chronic kidney disease, stage 2 (mild): Secondary | ICD-10-CM | POA: Diagnosis not present

## 2024-02-05 DIAGNOSIS — K219 Gastro-esophageal reflux disease without esophagitis: Secondary | ICD-10-CM | POA: Insufficient documentation

## 2024-02-05 DIAGNOSIS — Z7984 Long term (current) use of oral hypoglycemic drugs: Secondary | ICD-10-CM | POA: Diagnosis not present

## 2024-02-05 DIAGNOSIS — G4733 Obstructive sleep apnea (adult) (pediatric): Secondary | ICD-10-CM | POA: Diagnosis not present

## 2024-02-05 DIAGNOSIS — D509 Iron deficiency anemia, unspecified: Secondary | ICD-10-CM | POA: Insufficient documentation

## 2024-02-05 HISTORY — PX: HEMOSTASIS CLIP PLACEMENT: SHX6857

## 2024-02-05 HISTORY — PX: COLONOSCOPY WITH PROPOFOL: SHX5780

## 2024-02-05 HISTORY — PX: ESOPHAGOGASTRODUODENOSCOPY (EGD) WITH PROPOFOL: SHX5813

## 2024-02-05 HISTORY — PX: POLYPECTOMY: SHX5525

## 2024-02-05 SURGERY — COLONOSCOPY WITH PROPOFOL
Anesthesia: General

## 2024-02-05 MED ORDER — SODIUM CHLORIDE (PF) 0.9 % IJ SOLN
INTRAMUSCULAR | Status: DC | PRN
  Administered 2024-02-05: 16 mL

## 2024-02-05 MED ORDER — SODIUM CHLORIDE 0.9 % IV SOLN: INTRAVENOUS | Status: DC

## 2024-02-05 MED ORDER — DEXMEDETOMIDINE HCL IN NACL 80 MCG/20ML IV SOLN
INTRAVENOUS | Status: DC | PRN
  Administered 2024-02-05: 8 ug via INTRAVENOUS

## 2024-02-05 MED ORDER — PROPOFOL 10 MG/ML IV BOLUS
INTRAVENOUS | Status: AC
  Filled 2024-02-05: qty 20

## 2024-02-05 MED ORDER — GLYCOPYRROLATE 0.2 MG/ML IJ SOLN
INTRAMUSCULAR | Status: DC | PRN
  Administered 2024-02-05: .2 mg via INTRAVENOUS

## 2024-02-05 MED ORDER — LIDOCAINE HCL (CARDIAC) PF 100 MG/5ML IV SOSY
PREFILLED_SYRINGE | INTRAVENOUS | Status: DC | PRN
  Administered 2024-02-05: 100 mg via INTRAVENOUS

## 2024-02-05 MED ORDER — GLYCOPYRROLATE 0.2 MG/ML IJ SOLN
INTRAMUSCULAR | Status: AC
  Filled 2024-02-05: qty 1

## 2024-02-05 MED ORDER — PROPOFOL 10 MG/ML IV BOLUS
INTRAVENOUS | Status: DC | PRN
  Administered 2024-02-05: 20 mg via INTRAVENOUS
  Administered 2024-02-05: 50 mg via INTRAVENOUS
  Administered 2024-02-05: 30 mg via INTRAVENOUS
  Administered 2024-02-05: 80 mg via INTRAVENOUS
  Administered 2024-02-05: 40 mg via INTRAVENOUS
  Administered 2024-02-05: 30 mg via INTRAVENOUS
  Administered 2024-02-05: 70 mg via INTRAVENOUS

## 2024-02-05 NOTE — Op Note (Signed)
 [facility] Gastroenterology Patient Name: Sherri Peters Procedure Date: 02/05/2024 9:28 AM MRN: [id number] Account #: [id number] Date of Birth: September 28, 1946 Admit Type: Outpatient Age: 78 Room: [facility] ENDO ROOM 3 Gender: Female Note Status: Finalized Instrument Name: Upper Endoscope [id number] Procedure:             Upper GI endoscopy Indications:           Unexplained iron deficiency anemia, Gastro-esophageal                         reflux disease Providers:             Boykin Nearing. Genell Thede MD, MD Medicines:             Propofol per Anesthesia Complications:         No immediate complications. Estimated blood loss:                         Minimal. Procedure:             Pre-Anesthesia Assessment:                        - The risks and benefits of the procedure and the                         sedation options and risks were discussed with the                         patient. All questions were answered and informed                         consent was obtained.                        - Patient identification and proposed procedure were                         verified prior to the procedure by the nurse. The                         procedure was verified in the procedure room.                        - ASA Grade Assessment: III - A patient with severe                         systemic disease.                        - After reviewing the risks and benefits, the patient                         was deemed in satisfactory condition to undergo the                         procedure.                        After obtaining informed consent, the endoscope was  passed under direct vision. Throughout the procedure,                         the patient's blood pressure, pulse, and oxygen                         saturations were monitored continuously. The Endoscope                         was introduced through the mouth, and advanced to the                          third part of duodenum. The upper GI endoscopy was                         accomplished without difficulty. The patient tolerated                         the procedure well. Findings:      The esophagus was normal.      The stomach was normal.      The examined duodenum was normal. Biopsies for histology were taken with       a cold forceps for evaluation of celiac disease. Estimated blood loss       was minimal. Impression:            - Normal esophagus.                        - Normal stomach.                        - Normal examined duodenum. Biopsied. Recommendation:        - Await pathology results.                        - Proceed with colonoscopy Procedure Code(s):     --- Professional ---                        [phone], Esophagogastroduodenoscopy, flexible,                         transoral; with biopsy, single or multiple Diagnosis Code(s):     --- Professional ---                        K21.9, Gastro-esophageal reflux disease without                         esophagitis                        D50.9, Iron deficiency anemia, unspecified CPT copyright 2022 American Medical Association. All rights reserved. The codes documented in this report are preliminary and upon coder review [place]  be revised to meet current compliance requirements. Stanton Kidney MD, MD 02/05/2024 9:53:06 AM This report has been signed electronically. Number of Addenda: 0 Note Initiated On: 02/05/2024 9:28 AM Estimated Blood Loss:  Estimated blood loss was minimal.      [facility]

## 2024-02-05 NOTE — Anesthesia Postprocedure Evaluation (Signed)
 Anesthesia Post Note  Patient: Sherri Peters  Procedure(s) Performed: COLONOSCOPY WITH PROPOFOL ESOPHAGOGASTRODUODENOSCOPY (EGD) WITH PROPOFOL POLYPECTOMY CONTROL OF HEMORRHAGE, GI TRACT, ENDOSCOPIC, BY CLIPPING OR OVERSEWING  Patient location during evaluation: Endoscopy Anesthesia Type: General Level of consciousness: awake and alert Pain management: pain level controlled Vital Signs Assessment: post-procedure vital signs reviewed and stable Respiratory status: spontaneous breathing, nonlabored ventilation, respiratory function stable and patient connected to nasal cannula oxygen Cardiovascular status: blood pressure returned to baseline and stable Postop Assessment: no apparent nausea or vomiting Anesthetic complications: no   No notable events documented.   Last Vitals:  Vitals:   02/05/24 1037 02/05/24 1046  BP: 124/89 (!) 147/76  Pulse: 90 92  Resp: 12 13  Temp:    SpO2: 95% 100%    Last Pain:  Vitals:   02/05/24 1046  TempSrc:   PainSc: 0-No pain                 Lenard Simmer

## 2024-02-05 NOTE — H&P (Signed)
 Outpatient short stay form Pre-procedure 02/05/2024 9:26 AM Arkel Cartwright K. Norma Fredrickson, M.D.  Primary Physician: Ardyth Man, PA-C  Reason for visit:  Iron deficiency anemia  History of present illness:  Patient presents for diagnosis of progressive anemia and found to have iron deficiency. Has no complaints of upper symptoms such as anorexia, abdominal pain, severe GERD, dysphagia, hemetemesis, melena, nausea or vomiting. Patient denies  rectal bleeding, weight loss or abdominal pain.   Patient does have some "loose stools". No colonoscopy in over 30 years.   Current Facility-Administered Medications:    0.9 %  sodium chloride infusion, , Intravenous, Continuous, Aijah Lattner, Boykin Nearing, MD  Medications Prior to Admission  Medication Sig Dispense Refill Last Dose/Taking   atorvastatin (LIPITOR) 40 MG tablet TAKE 1 TABLET BY MOUTH  DAILY (Patient taking differently: Take 40 mg by mouth daily.) 29 tablet 0 02/04/2024   B Complex-C (B-COMPLEX WITH VITAMIN C) tablet Take 1 tablet by mouth daily.   Past Week   buPROPion (WELLBUTRIN XL) 150 MG 24 hr tablet Take 150 mg by mouth daily.   02/05/2024 at  6:00 AM   Cholecalciferol (VITAMIN D3) 50 MCG (2000 UT) capsule Take 2,000 Units by mouth daily.   Past Week   citalopram (CELEXA) 20 MG tablet Take 20 mg by mouth daily.   02/04/2024   levothyroxine (SYNTHROID, LEVOTHROID) 75 MCG tablet TAKE 1 TABLET BY MOUTH  DAILY (Patient taking differently: Take 75 mcg by mouth daily before breakfast.) 90 tablet 0 02/05/2024 at  6:00 AM   pantoprazole (PROTONIX) 40 MG tablet Take 1 tablet (40 mg total) by mouth 2 (two) times daily for 7 days. 14 tablet 0 02/05/2024 at  6:00 AM   pregabalin (LYRICA) 100 MG capsule Take 1 capsule by mouth 3 (three) times daily.   02/05/2024 at  6:00 AM   telmisartan-hydrochlorothiazide (MICARDIS HCT) 80-12.5 MG tablet TAKE 1 TABLET BY MOUTH  DAILY 90 tablet 0 02/04/2024   acetaminophen (TYLENOL) 650 MG CR tablet Take 650 mg by mouth every 8 (eight) hours  as needed for pain.      albuterol (VENTOLIN HFA) 108 (90 Base) MCG/ACT inhaler Inhale 2 puffs into the lungs every 6 (six) hours as needed for wheezing or shortness of breath. 8 g 2    busPIRone (BUSPAR) 5 MG tablet Take by mouth daily.      metFORMIN (GLUCOPHAGE-XR) 500 MG 24 hr tablet Take 500 mg by mouth daily.      ondansetron (ZOFRAN-ODT) 4 MG disintegrating tablet Take 1 tablet (4 mg total) by mouth every 6 (six) hours as needed for nausea or vomiting. 15 tablet 0    pregabalin (LYRICA) 100 MG capsule Take 1 capsule (100 mg total) by mouth 3 (three) times daily for 7 days. 21 capsule 0    triamcinolone cream (KENALOG) 0.1 % Apply 1 Application topically 2 (two) times daily. (Patient not taking: Reported on 01/17/2023)        Allergies  Allergen Reactions   Duloxetine Diarrhea    N/V/D   Levaquin [Levofloxacin] Other (See Comments)    hallucinations   Fluoxetine Other (See Comments)   Gabapentin Swelling   Meloxicam Swelling   Tizanidine Other (See Comments)     Past Medical History:  Diagnosis Date   Aortic atherosclerosis (HCC)    Arthritis    Bilateral carotid artery disease (HCC)    Carpal tunnel syndrome of left wrist    CKD (chronic kidney disease), stage II    COPD (chronic obstructive pulmonary  disease) (HCC)    Coronary artery disease    Depression    Diastolic dysfunction    a.) TTE 11/15/2018: EF >55%, mild LVH, MAC, BAE, triv PR, mild MR/TR, G1DD; b.) TTE 09/12/2021: EF >55%, mild LVH, MAC, triv TR/PR, mod MR, G1DD   Endophthalmitis, right    Fibromyalgia    GERD (gastroesophageal reflux disease)    History of bilateral cataract extraction 06/2018   Hyperlipidemia    Hypertension    Hypothyroidism    OSA on CPAP    Pre-diabetes    RLS (restless legs syndrome)    Statin intolerance    Umbilical hernia    a.) s/p repair 11/10/2016    Review of systems:  Otherwise negative.    Physical Exam  Gen: Alert, oriented. Appears stated age.  HEENT: [place]/AT.  PERRLA. Lungs: CTA, no wheezes. CV: RR nl S1, S2. Abd: soft, benign, no masses. BS+ Ext: No edema. Pulses 2+    Planned procedures: Proceed with EGD and colonoscopy. The patient understands the nature of the planned procedure, indications, risks, alternatives and potential complications including but not limited to bleeding, infection, perforation, damage to internal organs and possible oversedation/side effects from anesthesia. The patient agrees and gives consent to proceed.  Please refer to procedure notes for findings, recommendations and patient disposition/instructions.     Tamrah Victorino K. Norma Fredrickson, M.D. Gastroenterology 02/05/2024  9:26 AM

## 2024-02-05 NOTE — Anesthesia Preprocedure Evaluation (Signed)
 Anesthesia Evaluation  Patient identified by MRN, date of birth, ID band Patient awake    Reviewed: Allergy & Precautions, H&P , NPO status , Patient's Chart, lab work & pertinent test results, reviewed documented beta blocker date and time   History of Anesthesia Complications Negative for: history of anesthetic complications  Airway Mallampati: II  TM Distance: >3 FB Neck ROM: full    Dental  (+) Dental Advidsory Given, Implants, Upper Dentures, Lower Dentures   Pulmonary shortness of breath and with exertion, sleep apnea and Continuous Positive Airway Pressure Ventilation , COPD, neg recent URI, Current Smoker and Patient abstained from smoking.   Pulmonary exam normal breath sounds clear to auscultation       Cardiovascular Exercise Tolerance: Good hypertension, (-) angina + CAD  (-) Past MI and (-) Cardiac Stents Normal cardiovascular exam(-) dysrhythmias (-) Valvular Problems/Murmurs Rhythm:regular Rate:Normal     Neuro/Psych neg Seizures PSYCHIATRIC DISORDERS  Depression       GI/Hepatic Neg liver ROS,GERD  ,,  Endo/Other  diabetes, Well Controlled, Type 2, Oral Hypoglycemic AgentsHypothyroidism    Renal/GU CRFRenal disease  negative genitourinary   Musculoskeletal   Abdominal   Peds  Hematology negative hematology ROS (+)   Anesthesia Other Findings Past Medical History: No date: Aortic atherosclerosis (HCC) No date: Arthritis No date: Bilateral carotid artery disease (HCC) No date: Carpal tunnel syndrome of left wrist No date: CKD (chronic kidney disease), stage II No date: COPD (chronic obstructive pulmonary disease) (HCC) No date: Coronary artery disease No date: Depression No date: Diastolic dysfunction     Comment:  a.) TTE 11/15/2018: EF >55%, mild LVH, MAC, BAE, triv               PR, mild MR/TR, G1DD; b.) TTE 09/12/2021: EF >55%, mild               LVH, MAC, triv TR/PR, mod MR, G1DD No date:  Endophthalmitis, right No date: Fibromyalgia No date: GERD (gastroesophageal reflux disease) 06/2018: History of bilateral cataract extraction No date: Hyperlipidemia No date: Hypertension No date: Hypothyroidism No date: OSA on CPAP No date: Pre-diabetes No date: RLS (restless legs syndrome) No date: Statin intolerance No date: Umbilical hernia     Comment:  a.) s/p repair 11/10/2016   Reproductive/Obstetrics negative OB ROS                             Anesthesia Physical Anesthesia Plan  ASA: 3  Anesthesia Plan: General   Post-op Pain Management:    Induction: Intravenous  PONV Risk Score and Plan: 1 and Propofol infusion, TIVA and Treatment [place]seau vary due to age or medical condition  Airway Management Planned: Natural Airway and Nasal Cannula  Additional Equipment:   Intra-op Plan:   Post-operative Plan:   Informed Consent: I have reviewed the patients History and Physical, chart, labs and discussed the procedure including the risks, benefits and alternatives for the proposed anesthesia with the patient or authorized representative who has indicated his/her understanding and acceptance.     Dental Advisory Given  Plan Discussed with: Anesthesiologist, CRNA and Surgeon  Anesthesia Plan Comments:        Anesthesia Quick Evaluation

## 2024-02-05 NOTE — Transfer of Care (Signed)
 Immediate Anesthesia Transfer of Care Note  Patient: Sherri Peters  Procedure(s) Performed: COLONOSCOPY WITH PROPOFOL ESOPHAGOGASTRODUODENOSCOPY (EGD) WITH PROPOFOL POLYPECTOMY CONTROL OF HEMORRHAGE, GI TRACT, ENDOSCOPIC, BY CLIPPING OR OVERSEWING  Patient Location: Endoscopy Unit  Anesthesia Type:General  Level of Consciousness: awake, oriented, drowsy, and patient cooperative  Airway & Oxygen Therapy: Patient Spontanous Breathing  Post-op Assessment: Report given to RN and Post -op Vital signs reviewed and stable  Post vital signs: Reviewed and stable  Last Vitals:  Vitals Value Taken Time  BP 100/63 02/05/24 1027  Temp 36.1 C 02/05/24 1027  Pulse 89 02/05/24 1027  Resp 14 02/05/24 1027  SpO2 93 % 02/05/24 1027    Last Pain:  Vitals:   02/05/24 1027  TempSrc: Temporal  PainSc: Asleep         Complications: No notable events documented.

## 2024-02-05 NOTE — Op Note (Signed)
 [facility] Gastroenterology Patient Name: Sherri Peters Procedure Date: 02/05/2024 9:28 AM MRN: [id number] Account #: [id number] Date of Birth: 1945-12-14 Admit Type: Outpatient Age: 78 Room: [facility] ENDO ROOM 3 Gender: Female Note Status: Finalized Instrument Name: Prentice Docker [id number] Procedure:             Colonoscopy Indications:           Unexplained iron deficiency anemia, Change in bowel                         habits Providers:             Boykin Nearing. Shaquel Josephson MD, MD Medicines:             Propofol per Anesthesia Complications:         No immediate complications. Estimated blood loss: None. Procedure:             Pre-Anesthesia Assessment:                        - The risks and benefits of the procedure and the                         sedation options and risks were discussed with the                         patient. All questions were answered and informed                         consent was obtained.                        - Patient identification and proposed procedure were                         verified prior to the procedure by the nurse. The                         procedure was verified in the procedure room.                        - ASA Grade Assessment: III - A patient with severe                         systemic disease.                        - After reviewing the risks and benefits, the patient                         was deemed in satisfactory condition to undergo the                         procedure.                        After obtaining informed consent, the colonoscope was                         passed under direct vision. Throughout the procedure,  the patient's blood pressure, pulse, and oxygen                         saturations were monitored continuously. The                         Colonoscope was introduced through the anus and                         advanced to the the cecum, identified by appendiceal                          orifice and ileocecal valve. The colonoscopy was                         performed without difficulty. The patient tolerated                         the procedure well. The quality of the bowel                         preparation was good. The ileocecal valve, appendiceal                         orifice, and rectum were photographed. Findings:      The perianal and digital rectal examinations were normal. Pertinent       negatives include normal sphincter tone and no palpable rectal lesions.      Non-bleeding internal hemorrhoids were found during retroflexion. The       hemorrhoids were Grade I (internal hemorrhoids that do not prolapse).      Multiple medium-mouthed and small-mouthed diverticula were found in the       sigmoid colon. There was no evidence of diverticular bleeding.      Five sessile polyps were found in the cecum. The polyps were 10 to 16 mm       in size. Area was successfully injected with 16 mL saline for a lift       polypectomy. These polyps were removed with a hot snare. Resection was       complete, and retrieval was complete. To prevent bleeding after the       polypectomy, three hemostatic clips were successfully placed (MR       conditional). Clip manufacturer: [employer]. There was no       bleeding during, or at the end, of the procedure. Estimated blood loss:       none.      An 8 mm polyp was found in the proximal ascending colon. The polyp was       sessile. The polyp was removed with a saline injection-lift technique       using a hot snare. Resection and retrieval were complete. Estimated       blood loss: none.      The exam was otherwise without abnormality. Impression:            - Non-bleeding internal hemorrhoids.                        - Moderate diverticulosis in the sigmoid colon. There  was no evidence of diverticular bleeding.                        - Five 10 to 16 mm polyps in the cecum, removed with  a                         hot snare. Resected and retrieved. Injected. Clips (MR                         conditional) were placed. Clip manufacturer: [employer].                        - One 8 mm polyp in the proximal ascending colon,                         removed using injection-lift and a hot snare. Resected                         and retrieved.                        - The examination was otherwise normal. Recommendation:        - Patient has a contact number available for                         emergencies. The signs and symptoms of potential                         delayed complications were discussed with the patient.                         Return to normal activities tomorrow. Written                         discharge instructions were provided to the patient.                        - Await pathology results from EGD, also performed                         today.                        - Resume previous diet.                        - Continue present medications.                        - Repeat colonoscopy in 1 year for surveillance.                        - Return to GI office in 3 months.                        - To visualize the small bowel, perform video capsule  endoscopy at appointment to be scheduled. Procedure Code(s):     --- Professional ---                        [phone], Colonoscopy, flexible; with removal of                         tumor(s), polyp(s), or other lesion(s) by snare                         technique                        [id number], Colonoscopy, flexible; with directed submucosal                         injection(s), any substance Diagnosis Code(s):     --- Professional ---                        K57.30, Diverticulosis of large intestine without                         perforation or abscess without bleeding                        R19.4, Change in bowel habit                        D50.9, Iron  deficiency anemia, unspecified                        D12.2, Benign neoplasm of ascending colon                        D12.0, Benign neoplasm of cecum                        K64.0, First degree hemorrhoids CPT copyright 2022 American Medical Association. All rights reserved. The codes documented in this report are preliminary and upon coder review [place]  be revised to meet current compliance requirements. Stanton Kidney MD, MD 02/05/2024 10:30:39 AM This report has been signed electronically. Number of Addenda: 0 Note Initiated On: 02/05/2024 9:28 AM Scope Withdrawal Time: 0 hours 23 minutes 4 seconds  Total Procedure Duration: 0 hours 26 minutes 51 seconds  Estimated Blood Loss:  Estimated blood loss: none.      [facility]

## 2024-02-05 NOTE — Interval H&P Note (Signed)
 History and Physical Interval Note:  02/05/2024 9:29 AM  Sherri Peters  has presented today for surgery, with the diagnosis of D50.9 (ICD-10-CM) - Iron deficiency anemia, unspecified iron deficiency anemia type.  The various methods of treatment have been discussed with the patient and family. After consideration of risks, benefits and other options for treatment, the patient has consented to  Procedure(s) with comments: COLONOSCOPY WITH PROPOFOL (N/A) - DM ESOPHAGOGASTRODUODENOSCOPY (EGD) WITH PROPOFOL (N/A) as a surgical intervention.  The patient's history has been reviewed, patient examined, no change in status, stable for surgery.  I have reviewed the patient's chart and labs.  Questions were answered to the patient's satisfaction.     [place], [place]

## 2024-02-06 ENCOUNTER — Encounter: Payer: Self-pay | Admitting: Internal Medicine

## 2024-02-06 LAB — SURGICAL PATHOLOGY

## 2024-04-04 NOTE — Progress Notes (Signed)
 [facility] [address] [place], [place] [address]  Pulmonary Sleep Medicine   Office Visit Note  Patient Name: Sherri Peters DOB: [birth year] MRN [id number]    Chief Complaint: Obstructive Sleep Apnea visit  Brief History:  Alisson is seen today for an annual follow up visit for CPAP@ 9 cmH2O. The patient has a 9 year history of sleep apnea. Patient is using PAP nightly.  The patient feels rested after sleeping with PAP.  The patient reports benefiting from PAP use. Reported sleepiness is  improved and the Epworth Sleepiness Score is 8 out of 24. The patient does take naps daily. The patient complains of the following: none.  The compliance download shows 87% compliance with an average use time of 7 hours 2 minutes. The AHI is 17.9.  The patient does complain of limb movements disrupting sleep. The patient continues to require PAP therapy in order to eliminate sleep apnea.   ROS  General: (-) fever, (-) chills, (-) night sweat Nose and Sinuses: (-) nasal stuffiness or itchiness, (-) postnasal drip, (-) nosebleeds, (-) sinus trouble. Mouth and Throat: (-) sore throat, (-) hoarseness. Neck: (-) swollen glands, (-) enlarged thyroid, (-) neck pain. Respiratory: + cough, + shortness of breath, + wheezing. Neurologic: - numbness, - tingling. Psychiatric: + anxiety, + depression   Current Medication: Outpatient Encounter Medications as of 04/07/2024  Medication Sig   acetaminophen  (TYLENOL ) 650 MG CR tablet Take 650 mg by mouth every 8 (eight) hours as needed for pain.   albuterol  (VENTOLIN  HFA) 108 (90 Base) MCG/ACT inhaler Inhale 2 puffs into the lungs every 6 (six) hours as needed for wheezing or shortness of breath.   atorvastatin  (LIPITOR) 40 MG tablet TAKE 1 TABLET BY MOUTH  DAILY (Patient taking differently: Take 40 mg by mouth daily.)   B Complex-C (B-COMPLEX WITH VITAMIN C) tablet Take 1 tablet by mouth daily.   buPROPion  (WELLBUTRIN  XL) 150 MG 24 hr tablet Take 150 mg  by mouth daily.   busPIRone  (BUSPAR ) 5 MG tablet Take by mouth daily.   Cholecalciferol (VITAMIN D3) 50 MCG (2000 UT) capsule Take 2,000 Units by mouth daily.   citalopram  (CELEXA ) 20 MG tablet Take 20 mg by mouth daily.   levothyroxine  (SYNTHROID , LEVOTHROID) 75 MCG tablet TAKE 1 TABLET BY MOUTH  DAILY (Patient taking differently: Take 75 mcg by mouth daily before breakfast.)   metFORMIN  (GLUCOPHAGE -XR) 500 MG 24 hr tablet Take 500 mg by mouth daily.   ondansetron  (ZOFRAN -ODT) 4 MG disintegrating tablet Take 1 tablet (4 mg total) by mouth every 6 (six) hours as needed for nausea or vomiting.   pantoprazole  (PROTONIX ) 40 MG tablet Take 1 tablet (40 mg total) by mouth 2 (two) times daily for 7 days.   pregabalin  (LYRICA ) 100 MG capsule Take 1 capsule by mouth 3 (three) times daily.   pregabalin  (LYRICA ) 100 MG capsule Take 1 capsule (100 mg total) by mouth 3 (three) times daily for 7 days.   telmisartan -hydrochlorothiazide  (MICARDIS  HCT) 80-12.5 MG tablet TAKE 1 TABLET BY MOUTH  DAILY   triamcinolone cream (KENALOG) 0.1 % Apply 1 Application topically 2 (two) times daily. (Patient not taking: Reported on 01/17/2023)   No facility-administered encounter medications on file as of 04/07/2024.    Surgical History: Past Surgical History:  Procedure Laterality Date   ABDOMINAL HYSTERECTOMY     APPENDECTOMY N/A    CATARACT EXTRACTION Bilateral 06/2018   CHOLECYSTECTOMY N/A 12/08/2019   COLONOSCOPY     COLONOSCOPY WITH PROPOFOL  N/A  02/05/2024   Procedure: COLONOSCOPY WITH PROPOFOL ;  Surgeon: Toledo, Alphonsus Jeans, MD;  Location: [facility] ENDOSCOPY;  Service: Gastroenterology;  Laterality: N/A;  DM   ESOPHAGOGASTRODUODENOSCOPY (EGD) WITH PROPOFOL  N/A 02/05/2024   Procedure: ESOPHAGOGASTRODUODENOSCOPY (EGD) WITH PROPOFOL ;  Surgeon: Toledo, Alphonsus Jeans, MD;  Location: [facility] ENDOSCOPY;  Service: Gastroenterology;  Laterality: N/A;   HEMOSTASIS CLIP PLACEMENT  02/05/2024   Procedure: CONTROL OF HEMORRHAGE, GI TRACT,  ENDOSCOPIC, BY CLIPPING OR OVERSEWING;  Surgeon: Corky Diener, Alphonsus Jeans, MD;  Location: [facility] ENDOSCOPY;  Service: Gastroenterology;;   KNEE ARTHROPLASTY Left 01/17/2023   Procedure: COMPUTER ASSISTED TOTAL KNEE ARTHROPLASTY;  Surgeon: Arlyne Lame, MD;  Location: [facility] ORS;  Service: Orthopedics;  Laterality: Left;   POLYPECTOMY  02/05/2024   Procedure: POLYPECTOMY;  Surgeon: Toledo, Alphonsus Jeans, MD;  Location: [facility] ENDOSCOPY;  Service: Gastroenterology;;   TONSILLECTOMY Bilateral    TOTAL KNEE ARTHROPLASTY Bilateral 06/03/2020   ARTHROPLASTY, KNEE, CONDYLE AND PLATEAU; MEDIAL AND LATERAL COMPARTMENTSWITH OR WITHOUT PATELLA RESURFACING (TOTAL KNEE ARTHROPLASTY); Surgeon: Cyndi Drain, MD; Location: [facility] OR; Service: Orthopedics; Laterality: Right;   UMBILICAL HERNIA REPAIR  11/10/2016   LAPAROSCOPY, SURGICAL; REPAIR, UMBILICAL HERNIA (INCLUDES MESH INSERTION, WHEN PERFORMED); REDUCIBLE - laparoscopic umbilical hernia repair. GETA; Surgeon: Ever Hiss, MD; Location: [facility] OR; Service: General Surgery; Laterality: N/A    Medical History: Past Medical History:  Diagnosis Date   Aortic atherosclerosis (HCC)    Arthritis    Bilateral carotid artery disease (HCC)    Carpal tunnel syndrome of left wrist    CKD (chronic kidney disease), stage II    COPD (chronic obstructive pulmonary disease) (HCC)    Coronary artery disease    Depression    Diastolic dysfunction    a.) TTE 11/15/2018: EF >55%, mild LVH, MAC, BAE, triv PR, mild MR/TR, G1DD; b.) TTE 09/12/2021: EF >55%, mild LVH, MAC, triv TR/PR, mod MR, G1DD   Endophthalmitis, right    Fibromyalgia    GERD (gastroesophageal reflux disease)    History of bilateral cataract extraction 06/2018   Hyperlipidemia    Hypertension    Hypothyroidism    OSA on CPAP    Pre-diabetes    RLS (restless legs syndrome)    Statin intolerance    Umbilical hernia    a.) s/p repair 11/10/2016    Family History: Non  contributory to the present illness  Social History: Social History   Socioeconomic History   Marital status: Divorced    Spouse name: Not on file   Number of children: Not on file   Years of education: Not on file   Highest education level: Not on file  Occupational History   Not on file  Tobacco Use   Smoking status: Every Day    Current packs/day: 0.00    Types: Cigarettes    Last attempt to quit: 07/20/2016    Years since quitting: 7.7   Smokeless tobacco: Never  Vaping Use   Vaping status: Never Used  Substance and Sexual Activity   Alcohol use: No   Drug use: No   Sexual activity: Yes  Other Topics Concern   Not on file  Social History Narrative   Not on file   Social Drivers of Health   Financial Resource Strain: Low Risk  (09/24/2023)   Received from [facility]   Overall Financial Resource Strain (CARDIA)    Difficulty of Paying Living Expenses: Not very hard  Food Insecurity: No Food Insecurity (09/24/2023)   Received from [facility]   Hunger Vital Sign    Worried About Running Out of Food in the Last Year: Never true    Ran Out of Food in the Last Year: Never true  Transportation Needs: No Transportation Needs (09/24/2023)   Received from [facility]    In the past 12 months, has lack of transportation kept you from medical appointments or from getting medications?: No    Lack of Transportation (Non-Medical): No  Physical Activity: Not on file  Stress: Not on file  Social Connections: Not on file  Intimate Partner Violence: Not At Risk (01/17/2023)   Humiliation, Afraid, Rape, and Kick questionnaire    Fear of Current or Ex-Partner: No    Emotionally Abused: No    Physically Abused: No    Sexually Abused: No    Vital Signs: There were no vitals taken for this visit. There is no height or weight on file to calculate BMI.    Examination: General Appearance: The  patient is well-developed, well-nourished, and in no distress. Neck Circumference: 43 cm Skin: Gross inspection of skin unremarkable. Head: normocephalic, no gross deformities. Eyes: no gross deformities noted. ENT: ears appear grossly normal Neurologic: Alert and oriented. No involuntary movements.  STOP BANG RISK ASSESSMENT S (snore) Have you been told that you snore?     NO   T (tired) Are you often tired, fatigued, or sleepy during the day?   YES  O (obstruction) Do you stop breathing, choke, or gasp during sleep? NO   P (pressure) Do you have or are you being treated for high blood pressure? YES   B (BMI) Is your body index greater than 35 kg/m? NO   A (age) Are you 10 years old or older? YES   N (neck) Do you have a neck circumference greater than 16 inches?   YES  G (gender) Are you a female? NO   TOTAL STOP/BANG "YES" ANSWERS 4       A STOP-Bang score of 2 or less is considered low risk, and a score of 5 or more is high risk for having either moderate or severe OSA. For people who score 3 or 4, doctors Aston need to perform further assessment to determine how likely they are to have OSA.         EPWORTH SLEEPINESS SCALE:  Scale:  (0)= no chance of dozing; (1)= slight chance of dozing; (2)= moderate chance of dozing; (3)= high chance of dozing  Chance  Situtation    Sitting and reading: 3    Watching TV: 3    Sitting Inactive in public: 0    As a passenger in car: 2      Lying down to rest: 0    Sitting and talking: 0    Sitting quielty after lunch: 0    In a car, stopped in traffic: 0   TOTAL SCORE:   8 out of 24    SLEEP STUDIES:  PSG (04/02/20) AHI 6.8/hr, REM AHI 30.4/hr,  min SpO2 84% Titration (02/2015) CPAP@ 7 cmH2O Titration (04/2023) Recommend BiPAP@ 20/16 cmH2O   CPAP COMPLIANCE DATA:  Date Range: 04/06/2023-04/04/2024  Average Daily Use: 7 hours 2 minutes  Median Use: 7 hours 4 minutes  Compliance for > 4 Hours: 87%  AHI: 17.9  respiratory events per hour  Days Used: 327/365 days  Mask Leak: 7.2  95th Percentile Pressure: 9         LABS:  Recent Results (from the past 2160 hours)  Urinalysis, w/ Reflex to Culture (Infection Suspected) -Urine, Clean Catch     Status: Abnormal   Collection Time: 01/09/24  3:24 PM  Result Value Ref Range   Specimen Source URINE, CLEAN CATCH    Color, Urine YELLOW YELLOW   APPearance CLEAR CLEAR   Specific Gravity, Urine 1.010 1.005 - 1.030   pH 5.5 5.0 - 8.0   Glucose, UA NEGATIVE NEGATIVE mg/dL   Hgb urine dipstick NEGATIVE NEGATIVE   Bilirubin Urine NEGATIVE NEGATIVE   Ketones, ur NEGATIVE NEGATIVE mg/dL   Protein, ur NEGATIVE NEGATIVE mg/dL   Nitrite NEGATIVE NEGATIVE   Leukocytes,Ua NEGATIVE NEGATIVE   Squamous Epithelial / HPF 0-5 0 - 5 /HPF   WBC, UA 0-5 0 - 5 WBC/hpf    Comment: Reflex urine culture not performed if WBC <=10, OR if Squamous epithelial cells >5. If Squamous epithelial cells >5, suggest recollection.   RBC / HPF NONE SEEN 0 - 5 RBC/hpf   Bacteria, UA RARE (A) NONE SEEN    Comment: Performed at [facility], [address], [place], [place] [address]  Surgical pathology     Status: None   Collection Time: 02/05/24 12:00 AM  Result Value Ref Range   SURGICAL PATHOLOGY      SURGICAL PATHOLOGY [facility] [address], [address] [place], [place] [address] Telephone [phone] or [phone] Fax [phone]  REPORT OF SURGICAL PATHOLOGY   Accession #: [id number] Patient Name: NOBIE, ALLEYNE Visit # : [id number]  MRN: [id number] Physician: Mardy Shall DOB/Age April 10, 1946 (Age: 69) Gender: F Collected Date: 02/05/2024 Received Date: 02/05/2024  FINAL DIAGNOSIS       1. Duodenum, Biopsy, cbx :       - UNREMARKABLE DUODENAL MUCOSA.      - NEGATIVE FOR FEATURES OF CELIAC, DYSPLASIA, AND MALIGNANCY.       2. Cecum Polyp, hot snare x5 :       - TUBULAR ADENOMA (MULTIPLE FRAGMENTS).      - NEGATIVE  FOR HIGH-GRADE DYSPLASIA AND MALIGNANCY.       3. Ascending  Colon Polyp, hot snare proximal :       - TUBULAR ADENOMA.      - NEGATIVE FOR HIGH-GRADE DYSPLASIA AND MALIGNANCY.       ELECTRONIC SIGNATURE : Rubinas Md, Alexandria Ida , Sports administrator, Electronic Signature  MICROSCOPIC DESCRIPTION  CASE COMMENTS STAINS U SED IN DIAGNOSIS: H&E H&E H&E H&E    CLINICAL HISTORY  SPECIMEN(S) OBTAINED 1. Duodenum, Biopsy, Cbx 2. Cecum Polyp, Hot Snare X5 3. Ascending  Colon Polyp, Hot Snare Proximal  SPECIMEN COMMENTS: SPECIMEN CLINICAL INFORMATION: 1. EGD.Normal.Colon, internal hemorrhoids, diverticulosis, colon polyps    Gross Description 1. "CBX duodenum rule out celiac", received in formalin is a 0.8 x 0.5 x 0.2 cm aggregate of 3 pink-tan tissue fragments.The specimen is submitted in toto 1 block (1A). 2. "Hot snared cecal polyps x5", received in formalin is a2.0 x 1.5 x 0.5 cm aggregate of multiple tan-pink tissue fragments.The specimen is submitted in toto in 2 blocks (2A-B). 3. "Hot snared proximal ascending colon polyp x1", received in formalin is a 0.8 x 0.7 x 0.1 cm aggregate of 3 tan-pink tissue fragments.The specimen is submitted in toto in 1 block (3A).      AMG 02/05/2024        Report signed out from the following location(s) [place] [address], [place],  [place] [address] CLIA #: [id number]  [facility] [address] [place], [place] [address] CLIA #: [id number]     Radiology: No results found.  No results found.  No results found.    Assessment and Plan: Patient Active Problem List   Diagnosis Date Noted   Hyponatremia 10/09/2023   Metabolic acidosis 10/09/2023   Gastroenteritis 10/08/2023   Total knee replacement status 01/17/2023   Degeneration of lumbar intervertebral disc 12/16/2022   Scoliosis of lumbar spine 12/16/2022   Primary osteoarthritis of left knee 12/02/2022   Severe obesity (BMI  35.0-39.9) with comorbidity (HCC) 12/02/2022   Arthritis of right hip 07/04/2022   Low back pain 07/04/2022   Sacroiliac joint pain 07/04/2022   Arthropathy of lumbar facet joint 07/04/2022   Lumbar radiculopathy 07/04/2022   Lumbar spondylosis 07/04/2022   Spondylolisthesis, lumbar region 07/04/2022   Prediabetes 05/26/2022   Primary hypertension 05/15/2022   Coronary artery disease involving native coronary artery of native heart 08/18/2021   Fibromyalgia 05/05/2021   CPAP use counseling 03/14/2021   Secondary hyperparathyroidism (HCC) 02/15/2021   Tobacco use 02/15/2021   Primary osteoarthritis of both first carpometacarpal joints 11/09/2019   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/18/2019   Intractable nausea and vomiting 06/02/2019   Bilateral carotid artery stenosis 11/22/2018   SOBOE (shortness of breath on exertion) 11/22/2018   Endophthalmitis, right eye 07/09/2018   Pseudophakia of right eye 07/09/2018   Current moderate episode of major depressive disorder without prior episode (HCC) 01/02/2018   Primary osteoarthritis of right knee 12/07/2017   Chronic kidney disease, stage 3a (HCC) 11/22/2017   Gastroesophageal reflux disease without esophagitis 11/22/2017   Obesity (BMI 35.0-39.9 without comorbidity) 11/22/2017   Other emphysema (HCC) 11/22/2017   Age-related osteoporosis without current pathological fracture 10/18/2016   Bilateral chronic knee pain 10/18/2016   Chronic thumb pain, bilateral 10/18/2016   Carpal tunnel syndrome of left wrist 06/07/2016   Restless leg syndrome 12/14/2015   Arthritis of knee 12/14/2015   Shingles 08/23/2015   OSA on CPAP 05/18/2015   Hyperlipidemia    Hypothyroid    Hypertension    Depression     1. OSA (obstructive sleep apnea) (Primary) The patient does tolerate PAP and reports  benefit from PAP use. Her apnea is not controlled. She had a titration done last year that recommended a switch to bipap at 20/16. Patient  cannot afford a new machine. We did try apap briefly last year which did not improve her apnea. We will try adjusting her pressure to 14, with the goal of ultimately trying to get her to 20 cm. She will purchase a wedge pillow as she cannot tolerate sleeping laterally. Hopefully this will allow her apnea to be controlled at lower pressures. We will do a 2 week download. The patient was reminded how to clean equipment and advised to replace supplies routinely. The patient was also counselled on weight loss. The compliance is good. The AHI is 17.9.   OSA on cpap- not controlled. Unable to afford bipap. Will adjust to 14 cm, wedge pillow, and f/u download 2 weeks. Will continue to try to incrementally adjust pressures to get closer to her IPAP requirement. F/u 63m   2. CPAP use counseling CPAP Counseling: had a lengthy discussion with the patient regarding the importance of PAP therapy in management of the sleep apnea. Patient appears to understand the risk factor reduction and also understands the risks associated with untreated sleep apnea. Patient will try to  make a good faith effort to remain compliant with therapy. Also instructed the patient on proper cleaning of the device including the water must be changed daily if possible and use of distilled water is preferred. Patient understands that the machine should be regularly cleaned with appropriate recommended cleaning solutions that do not damage the PAP machine for example given white vinegar and water rinses. Other methods such as ozone treatment Biedermann not be as good as these simple methods to achieve cleaning.   3. Restless leg syndrome She is on lyrica . Continue to monitor.     General Counseling: I have discussed the findings of the evaluation and examination with Tanya Fantasia.  I have also discussed any further diagnostic evaluation thatmay be needed or ordered today. Calissa verbalizes understanding of the findings of todays visit. We also reviewed her  medications today and discussed drug interactions and side effects including but not limited excessive drowsiness and altered mental states. We also discussed that there is always a risk not just to her but also people around her. she has been encouraged to call the office with any questions or concerns that should arise related to todays visit.  No orders of the defined types were placed in this encounter.       I have personally obtained a history, examined the patient, evaluated laboratory and imaging results, formulated the assessment and plan and placed orders. This patient was seen today by Louann Rous, PA-C in collaboration with Dr. Cam Cava.   Cordie Deters, MD [facility] Diplomate ABMS Pulmonary Critical Care Medicine and Sleep Medicine

## 2024-04-07 ENCOUNTER — Ambulatory Visit (INDEPENDENT_AMBULATORY_CARE_PROVIDER_SITE_OTHER): Admitting: Internal Medicine

## 2024-04-07 VITALS — BP 114/67 | HR 86 | Resp 16 | Ht 63.0 in | Wt 187.0 lb

## 2024-04-07 DIAGNOSIS — G2581 Restless legs syndrome: Secondary | ICD-10-CM | POA: Diagnosis not present

## 2024-04-07 DIAGNOSIS — G4733 Obstructive sleep apnea (adult) (pediatric): Secondary | ICD-10-CM

## 2024-04-07 DIAGNOSIS — Z7189 Other specified counseling: Secondary | ICD-10-CM | POA: Diagnosis not present

## 2024-04-07 NOTE — Patient Instructions (Signed)

## 2024-07-11 NOTE — Progress Notes (Signed)
 [facility] [address] [place], [place] [address]  Pulmonary Sleep Medicine   Office Visit Note  Patient Name: Sherri Peters DOB: 10/07/1946 MRN [id number]    Chief Complaint: Obstructive Sleep Apnea visit  Brief History:  Jameila is seen today for an annual follow up visit for CPAP@ 9 cmH2O. The patient has a 10 year history of sleep apnea. Patient is using PAP nightly.  The patient feels rested after sleeping with PAP.  The patient reports benefiting from PAP use. Reported sleepiness is  improved and the Epworth Sleepiness Score is 3 out of 24. The patient does not take naps. The patient complains of the following: none.  The compliance download shows 97% compliance with an average use time of 8 hours 9 minutes. The AHI is 12.8.  The patient does not complain of limb movements disrupting sleep. The patient continues to require PAP therapy in order to eliminate sleep apnea.   ROS  General: (-) fever, (-) chills, (-) night sweat Nose and Sinuses: (-) nasal stuffiness or itchiness, (-) postnasal drip, (-) nosebleeds, (-) sinus trouble. Mouth and Throat: (-) sore throat, (-) hoarseness. Neck: (-) swollen glands, (-) enlarged thyroid, (-) neck pain. Respiratory: - cough, + shortness of breath (with exertion) , - wheezing. Neurologic: - numbness, - tingling. Psychiatric: - anxiety, + depression   Current Medication: Outpatient Encounter Medications as of 07/14/2024  Medication Sig   acetaminophen  (TYLENOL ) 650 MG CR tablet Take 650 mg by mouth every 8 (eight) hours as needed for pain.   albuterol  (VENTOLIN  HFA) 108 (90 Base) MCG/ACT inhaler Inhale 2 puffs into the lungs every 6 (six) hours as needed for wheezing or shortness of breath.   atorvastatin  (LIPITOR) 40 MG tablet TAKE 1 TABLET BY MOUTH  DAILY (Patient taking differently: Take 40 mg by mouth daily.)   B Complex-C (B-COMPLEX WITH VITAMIN C) tablet Take 1 tablet by mouth daily.   buPROPion  (WELLBUTRIN  XL) 150 MG 24  hr tablet Take 150 mg by mouth daily.   busPIRone  (BUSPAR ) 5 MG tablet Take by mouth daily.   Cholecalciferol (VITAMIN D3) 50 MCG (2000 UT) capsule Take 2,000 Units by mouth daily.   citalopram  (CELEXA ) 20 MG tablet Take 20 mg by mouth daily.   levothyroxine  (SYNTHROID , LEVOTHROID) 75 MCG tablet TAKE 1 TABLET BY MOUTH  DAILY (Patient taking differently: Take 75 mcg by mouth daily before breakfast.)   ondansetron  (ZOFRAN -ODT) 4 MG disintegrating tablet Take 1 tablet (4 mg total) by mouth every 6 (six) hours as needed for nausea or vomiting.   pantoprazole  (PROTONIX ) 40 MG tablet Take 1 tablet (40 mg total) by mouth 2 (two) times daily for 7 days.   pregabalin  (LYRICA ) 100 MG capsule Take 1 capsule by mouth 3 (three) times daily.   pregabalin  (LYRICA ) 100 MG capsule Take 1 capsule (100 mg total) by mouth 3 (three) times daily for 7 days.   telmisartan -hydrochlorothiazide  (MICARDIS  HCT) 80-12.5 MG tablet TAKE 1 TABLET BY MOUTH  DAILY   triamcinolone cream (KENALOG) 0.1 % Apply 1 Application topically 2 (two) times daily. (Patient not taking: Reported on 01/17/2023)   No facility-administered encounter medications on file as of 07/14/2024.    Surgical History: Past Surgical History:  Procedure Laterality Date   ABDOMINAL HYSTERECTOMY     APPENDECTOMY N/A    CATARACT EXTRACTION Bilateral 06/2018   CHOLECYSTECTOMY N/A 12/08/2019   COLONOSCOPY     COLONOSCOPY WITH PROPOFOL  N/A 02/05/2024   Procedure: COLONOSCOPY WITH PROPOFOL ;  Surgeon: Hickman, Teodoro  K, MD;  Location: [facility] ENDOSCOPY;  Service: Gastroenterology;  Laterality: N/A;  DM   ESOPHAGOGASTRODUODENOSCOPY (EGD) WITH PROPOFOL  N/A 02/05/2024   Procedure: ESOPHAGOGASTRODUODENOSCOPY (EGD) WITH PROPOFOL ;  Surgeon: Toledo, Ladell POUR, MD;  Location: [facility] ENDOSCOPY;  Service: Gastroenterology;  Laterality: N/A;   HEMOSTASIS CLIP PLACEMENT  02/05/2024   Procedure: CONTROL OF HEMORRHAGE, GI TRACT, ENDOSCOPIC, BY CLIPPING OR OVERSEWING;  Surgeon: Aundria,  Ladell POUR, MD;  Location: [facility] ENDOSCOPY;  Service: Gastroenterology;;   KNEE ARTHROPLASTY Left 01/17/2023   Procedure: COMPUTER ASSISTED TOTAL KNEE ARTHROPLASTY;  Surgeon: Mardee Lynwood SQUIBB, MD;  Location: [facility] ORS;  Service: Orthopedics;  Laterality: Left;   POLYPECTOMY  02/05/2024   Procedure: POLYPECTOMY;  Surgeon: Toledo, Ladell POUR, MD;  Location: [facility] ENDOSCOPY;  Service: Gastroenterology;;   TONSILLECTOMY Bilateral    TOTAL KNEE ARTHROPLASTY Bilateral 06/03/2020   ARTHROPLASTY, KNEE, CONDYLE AND PLATEAU; MEDIAL AND LATERAL COMPARTMENTSWITH OR WITHOUT PATELLA RESURFACING (TOTAL KNEE ARTHROPLASTY); Surgeon: Sande Ozell Mt, MD; Location: [facility] OR; Service: Orthopedics; Laterality: Right;   UMBILICAL HERNIA REPAIR  11/10/2016   LAPAROSCOPY, SURGICAL; REPAIR, UMBILICAL HERNIA (INCLUDES MESH INSERTION, WHEN PERFORMED); REDUCIBLE - laparoscopic umbilical hernia repair. GETA; Surgeon: Ladora Hawthorn, MD; Location: [facility] OR; Service: General Surgery; Laterality: N/A    Medical History: Past Medical History:  Diagnosis Date   Aortic atherosclerosis (HCC)    Arthritis    Bilateral carotid artery disease (HCC)    Carpal tunnel syndrome of left wrist    CKD (chronic kidney disease), stage II    COPD (chronic obstructive pulmonary disease) (HCC)    Coronary artery disease    Depression    Diastolic dysfunction    a.) TTE 11/15/2018: EF >55%, mild LVH, MAC, BAE, triv PR, mild MR/TR, G1DD; b.) TTE 09/12/2021: EF >55%, mild LVH, MAC, triv TR/PR, mod MR, G1DD   Endophthalmitis, right    Fibromyalgia    GERD (gastroesophageal reflux disease)    History of bilateral cataract extraction 06/2018   Hyperlipidemia    Hypertension    Hypothyroidism    OSA on CPAP    Pre-diabetes    RLS (restless legs syndrome)    Statin intolerance    Umbilical hernia    a.) s/p repair 11/10/2016    Family History: Non contributory to the present illness  Social  History: Social History   Socioeconomic History   Marital status: Divorced    Spouse name: Not on file   Number of children: Not on file   Years of education: Not on file   Highest education level: Not on file  Occupational History   Not on file  Tobacco Use   Smoking status: Every Day    Current packs/day: 0.00    Types: Cigarettes    Last attempt to quit: 07/20/2016    Years since quitting: 7.9   Smokeless tobacco: Never  Vaping Use   Vaping status: Never Used  Substance and Sexual Activity   Alcohol use: No   Drug use: No   Sexual activity: Yes  Other Topics Concern   Not on file  Social History Narrative   Not on file   Social Drivers of Health   Financial Resource Strain: Medium Risk (04/13/2024)   Received from [facility]   Overall Financial Resource Strain (CARDIA)    Difficulty of Paying Living Expenses: Somewhat hard  Food Insecurity: No Food Insecurity (04/13/2024)   Received from [facility]   Hunger Vital Sign    Within the past  12 months, you worried that your food would run out before you got the money to buy more.: Never true    Within the past 12 months, the food you bought just didn't last and you didn't have money to get more.: Never true  Transportation Needs: No Transportation Needs (04/13/2024)   Received from [facility] - Transportation    In the past 12 months, has lack of transportation kept you from medical appointments or from getting medications?: No    Lack of Transportation (Non-Medical): No  Physical Activity: Not on file  Stress: Not on file  Social Connections: Not on file  Intimate Partner Violence: Not At Risk (01/17/2023)   Humiliation, Afraid, Rape, and Kick questionnaire    Fear of Current or Ex-Partner: No    Emotionally Abused: No    Physically Abused: No    Sexually Abused: No    Vital Signs: There were no vitals taken for this visit. There is no height or weight  on file to calculate BMI.    Examination: General Appearance: The patient is well-developed, well-nourished, and in no distress. Neck Circumference: 43 cm Skin: Gross inspection of skin unremarkable. Head: normocephalic, no gross deformities. Eyes: no gross deformities noted. ENT: ears appear grossly normal Neurologic: Alert and oriented. No involuntary movements.  STOP BANG RISK ASSESSMENT S (snore) Have you been told that you snore?     NO   T (tired) Are you often tired, fatigued, or sleepy during the day?   YES  O (obstruction) Do you stop breathing, choke, or gasp during sleep? NO   P (pressure) Do you have or are you being treated for high blood pressure? YES   B (BMI) Is your body index greater than 35 kg/m? YES   A (age) Are you 32 years old or older? NO   N (neck) Do you have a neck circumference greater than 16 inches?   YES   G (gender) Are you a female? NO   TOTAL STOP/BANG "YES" ANSWERS 4       A STOP-Bang score of 2 or less is considered low risk, and a score of 5 or more is high risk for having either moderate or severe OSA. For people who score 3 or 4, doctors Labell need to perform further assessment to determine how likely they are to have OSA.         EPWORTH SLEEPINESS SCALE:  Scale:  (0)= no chance of dozing; (1)= slight chance of dozing; (2)= moderate chance of dozing; (3)= high chance of dozing  Chance  Situtation    Sitting and reading: 1    Watching TV: 1    Sitting Inactive in public: 0    As a passenger in car: 1      Lying down to rest: 0    Sitting and talking: 0    Sitting quielty after lunch: 0    In a car, stopped in traffic: 0   TOTAL SCORE:   3 out of 24    SLEEP STUDIES:  PSG (04/02/20) AHI 6.8/hr, REM AHI 30.4/hr,  min SpO2 84% Titration (02/2015) CPAP@ 7 cmH2O Titration (04/2023) Recommend BiPAP@ 20/16 cmH2O   CPAP COMPLIANCE DATA:  Date Range: 04/11/2024-07/09/2024  Average Daily Use: 8 hours 9  minutes  Median Use: 8 hours 20 minutes  Compliance for > 4 Hours: 97%  AHI: 12.8 respiratory events per hour  Days Used: 89/90 days  Mask Leak: 35.9  95th Percentile  Pressure: 14         LABS: No results found for this or any previous visit (from the past 2160 hours).  Radiology: No results found.  No results found.  No results found.    Assessment and Plan: Patient Active Problem List   Diagnosis Date Noted   Hyponatremia 10/09/2023   Metabolic acidosis 10/09/2023   Gastroenteritis 10/08/2023   Total knee replacement status 01/17/2023   Degeneration of lumbar intervertebral disc 12/16/2022   Scoliosis of lumbar spine 12/16/2022   Primary osteoarthritis of left knee 12/02/2022   Severe obesity (BMI 35.0-39.9) with comorbidity (HCC) 12/02/2022   Arthritis of right hip 07/04/2022   Low back pain 07/04/2022   Sacroiliac joint pain 07/04/2022   Arthropathy of lumbar facet joint 07/04/2022   Lumbar radiculopathy 07/04/2022   Lumbar spondylosis 07/04/2022   Spondylolisthesis, lumbar region 07/04/2022   Prediabetes 05/26/2022   Primary hypertension 05/15/2022   Coronary artery disease involving native coronary artery of native heart 08/18/2021   Fibromyalgia 05/05/2021   CPAP use counseling 03/14/2021   Secondary hyperparathyroidism (HCC) 02/15/2021   Tobacco use 02/15/2021   Primary osteoarthritis of both first carpometacarpal joints 11/09/2019   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/18/2019   Intractable nausea and vomiting 06/02/2019   Bilateral carotid artery stenosis 11/22/2018   SOBOE (shortness of breath on exertion) 11/22/2018   Endophthalmitis, right eye 07/09/2018   Pseudophakia of right eye 07/09/2018   Current moderate episode of major depressive disorder without prior episode (HCC) 01/02/2018   Primary osteoarthritis of right knee 12/07/2017   Chronic kidney disease, stage 3a (HCC) 11/22/2017   Gastroesophageal reflux disease  without esophagitis 11/22/2017   Obesity (BMI 35.0-39.9 without comorbidity) 11/22/2017   Other emphysema (HCC) 11/22/2017   Age-related osteoporosis without current pathological fracture 10/18/2016   Bilateral chronic knee pain 10/18/2016   Chronic thumb pain, bilateral 10/18/2016   Carpal tunnel syndrome of left wrist 06/07/2016   Restless leg syndrome 12/14/2015   Arthritis of knee 12/14/2015   Shingles 08/23/2015   OSA on CPAP 05/18/2015   Hyperlipidemia    Hypothyroid    Hypertension    Depression    1. OSA (obstructive sleep apnea) (Primary) The patient does tolerate PAP and reports  benefit from PAP use. Her apnea is improved on the pressure of 14 cm that we increased last visit, but it is still not controlled. Patient cannot afford a new machine at this time, so we will continue to incrementally increase her pressures to get as close as possible to the required pressure from her bipap titration from 2024. We will increase to 16 cm and do a 2 week download. The patient was reminded how to clean equipment and advised to replace supplies routinely. The patient was also counselled on weight loss. The compliance is excellent. The AHI is 12.8.   OSA- not controlled. Increase pressure to 16 cm, 2 week download. F/u 53m.   2. CPAP use counseling CPAP Counseling: had a lengthy discussion with the patient regarding the importance of PAP therapy in management of the sleep apnea. Patient appears to understand the risk factor reduction and also understands the risks associated with untreated sleep apnea. Patient will try to make a good faith effort to remain compliant with therapy. Also instructed the patient on proper cleaning of the device including the water must be changed daily if possible and use of distilled water is preferred. Patient understands that the machine should be regularly cleaned with appropriate recommended  cleaning solutions that do not damage the PAP machine for example given  white vinegar and water rinses. Other methods such as ozone treatment Roundy not be as good as these simple methods to achieve cleaning.      General Counseling: I have discussed the findings of the evaluation and examination with Consuelo.  I have also discussed any further diagnostic evaluation thatmay be needed or ordered today. Paizleigh verbalizes understanding of the findings of todays visit. We also reviewed her medications today and discussed drug interactions and side effects including but not limited excessive drowsiness and altered mental states. We also discussed that there is always a risk not just to her but also people around her. she has been encouraged to call the office with any questions or concerns that should arise related to todays visit.  No orders of the defined types were placed in this encounter.       I have personally obtained a history, examined the patient, evaluated laboratory and imaging results, formulated the assessment and plan and placed orders. This patient was seen today by Lauraine Lay, PA-C in collaboration with Dr. Elfreda Bathe.   Elfreda DELENA Bathe, MD [facility] Diplomate ABMS Pulmonary Critical Care Medicine and Sleep Medicine

## 2024-07-14 ENCOUNTER — Ambulatory Visit (INDEPENDENT_AMBULATORY_CARE_PROVIDER_SITE_OTHER): Admitting: Internal Medicine

## 2024-07-14 VITALS — BP 131/76 | HR 82 | Resp 16 | Ht 63.0 in | Wt 193.0 lb

## 2024-07-14 DIAGNOSIS — Z7189 Other specified counseling: Secondary | ICD-10-CM | POA: Diagnosis not present

## 2024-07-14 DIAGNOSIS — G4733 Obstructive sleep apnea (adult) (pediatric): Secondary | ICD-10-CM

## 2024-07-14 NOTE — Patient Instructions (Signed)

## 2024-10-08 NOTE — Progress Notes (Signed)
 Established Patient Visit   Chief Complaint: Chief Complaint  Patient presents with  . Coronary Artery Disease  . Follow-up  . Shortness of Breath  . Leg Swelling   Date of Service: 10/08/2024 Date of Birth: October 15, 1946 PCP: Johnie Damien Dragon, PA  History of Present Illness: Sherri Peters is a 78 y.o.female patient with a past history of coronary artery disease by CT scan, hypertension, OSA on CPAP, hyperlipidemia, bilateral carotid artery stenosis.    History of Present Illness Sherri Peters is a 78 year old female with prediabetes and hypertension who presents with worsening shortness of breath and leg swelling.  She experiences increased shortness of breath, which she attributes to swelling in her legs and feet, with the right leg being more affected than the left. Her symptoms have been present for some time and have worsened since her last visit six to seven months ago. She notes that her symptoms fluctuate, sometimes being worse than at present.  She describes a sensation of 'walking on rocks.' She experiences a pins and needles sensation in her feet.  She is currently taking a combination of telmisartan  and hydrochlorothiazide  for hypertension and atorvastatin  three times a week for cholesterol management. Atorvastatin  causes muscle pain, which is why she takes it less frequently. Her primary doctor previously adjusted her blood pressure medication by increasing the diuretic component, but this did not alleviate her symptoms.  She has a history of prediabetes and is not currently taking metformin . She experiences tremors, primarily in her hands, which vary in severity. She is unsure if these are related to her shoulder issues or another cause.   Past Medical and Surgical History  Past Medical History Past Medical History:  Diagnosis Date  . Age-related osteoporosis without current pathological fracture 10/18/2016  . Anemia, unspecified type 08/03/2023  . Anxiety 10/10/2023  . Arthritis  of knee 12/14/2015   Last Assessment & Plan:  The current medical regimen is effective;  continue present plan and medications.  . Bilateral carotid artery stenosis 11/22/2018  . Bilateral chronic knee pain 10/18/2016  . Carpal tunnel syndrome of left wrist 06/07/2016   Last Assessment & Plan:  Discussed splints and care  . Chronic renal insufficiency, stage 2 (mild)   . Chronic thumb pain, bilateral 10/18/2016  . Coronary artery disease involving native coronary artery of native heart 08/18/2021  . CRI (chronic renal insufficiency), stage 3 (moderate) (CMS/HHS-HCC) 11/22/2017  . Depression   . Endophthalmitis, right eye 07/09/2018  . Fibromyalgia 05/05/2021  . Fibromyalgia muscle pain 05/05/2021  . Finger pain, left 09/17/2019  . Finger pain, right 09/17/2019  . Gastroesophageal reflux disease without esophagitis 11/22/2017  . H/O radioactive iodine thyroid ablation   . History of chicken pox   . Hyperlipidemia 11/22/2017   Last Assessment & Plan:  The current medical regimen is effective;  continue present plan and medications.  . Hypertension   . Hypothyroid 11/22/2017   Last Assessment & Plan:  The current medical regimen is effective;  continue present plan and medications.  . Kidney disease   . Obesity 2019  . OSA on CPAP 05/18/2015   Doing well using every night Chronic headaches have resolved  . Osteoarthritis   . Other emphysema (CMS/HHS-HCC) 11/22/2017  . Prediabetes 05/26/2022  . Pseudophakia of right eye 07/09/2018   06/27/18 - Complex - followed by Endophthalmitis  . Restless leg syndrome 12/14/2015   Last Assessment & Plan:  Chest restless legs will increase Lyrica  200 mg twice a day patient  Helman increase to 300 mg a day if needed.  . Shingles 08/23/2015   Last Assessment & Plan:  Discussed shingles care and treatment. That she is contagious for chickenpox and avoid pregnant women and children. To be sure and wash her hands after touching her scalp and rash area.  Discussed treatment will not use prednisone  because of history of possible allergic reaction to prednisone . We will use acyclovir but understands its late in the process and Guiney not be very  . Sleep apnea   . Statin intolerance 06/17/2021  . Tobacco abuse 11/22/2017  . Umbilical hernia without obstruction and without gangrene 09/18/2016  . Vision abnormalities     Past Surgical History She has a past surgical history that includes Appendectomy (1980s); Tonsillectomy (1950s); Abdominal hysterectomy (1980s); Colonoscopy; laparoscopic repair umbilical hernia (N/A, 11/10/2016); Hernia repair (2018, Feb); Hysterectomy (1980s); extraction cataract extracapsular w/insertion intraocular prosthesis (Right, 06/27/2018); extraction cataract extracapsular w/insertion intraocular prosthesis (Right, 06/27/2018); injection intravitreal pharmacologic agent (Right, 07/05/2018); Cholecystectomy (12/08/2019); arthroplasty total knee (Right, 05/24/2020); Left total knee arthroplasty using computer-assisted navigation (01/17/2023); Colon @ [facility] (02/05/2024); and EGD @ [facility] (02/05/2024).   Medications and Allergies  Current Medications  Current Outpatient Medications on File Prior to Visit  Medication Sig Dispense Refill  . acetaminophen  (TYLENOL ) 650 MG ER tablet Take 1 tablet (650 mg total) by mouth 3 (three) times a day 30 tablet 0  . buPROPion  (WELLBUTRIN  XL) 150 MG XL tablet TAKE 1 TABLET BY MOUTH EVERY DAY 90 tablet 3  . busPIRone  (BUSPAR ) 5 MG tablet Take 1 tablet (5 mg total) by mouth 2 (two) times daily as needed 180 tablet 3  . citalopram  (CELEXA ) 40 MG tablet Take 40 mg by mouth once daily    . levothyroxine  (SYNTHROID ) 75 MCG tablet TAKE 1 TABLET ONE TIME DAILY ON AN EMPTY STOMACH WITH A GLASS OF WATER AT LEAST 30 TO 60 MINUTES BEFORE BREAKFAST 100 tablet 3  . multivitamin tablet Take 1 tablet by mouth once daily    . ondansetron  (ZOFRAN -ODT) 4 MG disintegrating tablet TAKE 1 TABLET BY MOUTH EVERY 8  HOURS AS NEEDED FOR UP TO 5 DAYS    . pantoprazole  (PROTONIX ) 40 MG DR tablet Take 1 tablet (40 mg total) by mouth 2 (two) times daily before meals 60 tablet 11  . pregabalin  (LYRICA ) 100 MG capsule Take 1 capsule (100 mg total) by mouth 3 (three) times daily 90 capsule 2  . telmisartan -hydroCHLOROthiazide  (MICARDIS  HCT) 80-12.5 mg tablet Take 1 tablet by mouth once daily 90 tablet 3  . albuterol  MDI, PROVENTIL , VENTOLIN , PROAIR , HFA 90 mcg/actuation inhaler Inhale 2 inhalations into the lungs every 4 (four) hours as needed for Wheezing or Shortness of Breath for up to 30 days (Patient not taking: Reported on 10/08/2024) 1 each 11  . ascorbic acid, vitamin C, (VITAMIN C) 500 MG tablet Take 500 mg by mouth once daily (Patient not taking: Reported on 10/08/2024)    . b complex vitamins capsule Take 1 capsule by mouth once daily (Patient not taking: Reported on 10/08/2024)    . cholecalciferol (VITAMIN D3) 2,000 unit capsule Take 2,000 Units by mouth once daily (Patient not taking: Reported on 10/08/2024)    . citalopram  (CELEXA ) 20 MG tablet Take 1 tablet (20 mg total) by mouth once daily (Patient not taking: Reported on 10/08/2024) 90 tablet 3   No current facility-administered medications on file prior to visit.    Allergies: Fluoxetine, Tizanidine, Cymbalta [duloxetine], Gabapentin , Levofloxacin, and Meloxicam   Social and  Family History  Social History  reports that she has been smoking cigarettes. She started smoking about 53 years ago. She has a 53.9 pack-year smoking history. She has never used smokeless tobacco. She reports that she does not drink alcohol and does not use drugs.  Family History Family History  Problem Relation Name Age of Onset  . Coronary Artery Disease (Blocked arteries around heart) Mother Gale JAYSON Hummer   . Heart disease Mother Gale JAYSON Hummer   . High blood pressure (Hypertension) Mother Gale JAYSON Hummer   . Hyperlipidemia (Elevated cholesterol) Mother Gale JAYSON Hummer    . Osteoarthritis Mother Gale JAYSON Hummer   . Reflux disease Mother Gale JAYSON Hummer   . Myocardial Infarction (Heart attack) Mother Gale JAYSON Hummer   . Peripheral Vascular Disease (PVD or blocked arteries in arms and legs) Mother Gale JAYSON Hummer   . Anesthesia problems Mother Gale JAYSON Hummer        cardiac arrest with epidural  . Coronary Artery Disease (Blocked arteries around heart) Father Claudean Hummer   . Stroke Father Claudean Hummer   . Heart disease Father Claudean Hummer   . High blood pressure (Hypertension) Father Claudean Hummer   . Hyperlipidemia (Elevated cholesterol) Father Claudean Hummer   . Reflux disease Father Claudean Hummer   . Myocardial Infarction (Heart attack) Father Claudean Hummer   . Depression Sister    . Stroke Daughter    . Skin cancer Daughter    . Obesity Sister    . Glaucoma Sister    . Coronary Artery Disease (Blocked arteries around heart) Sister    . Hyperlipidemia (Elevated cholesterol) Sister    . High blood pressure (Hypertension) Sister Tilton Hummer Stiltner   . Hyperlipidemia (Elevated cholesterol) Sister Tilton Hummer [address]   . Kidney disease Sister Tilton Hummer Darnel   . Reflux disease Sister Tilton Hummer Darnel   . Coronary Artery Disease (Blocked arteries around heart) Sister Tilton Hummer Darnel   . Heart disease Sister Tilton Hummer Darnel   . High blood pressure (Hypertension) Sister Lonell Hummer Cancer   . Hyperlipidemia (Elevated cholesterol) Sister Lonell Hummer Cancer   . Reflux disease Sister Lonell Hummer Cancer   . High blood pressure (Hypertension) Sister Mercy Hummer Desanctis        Cerebral Aneurysm rupture survivor  . Hyperlipidemia (Elevated cholesterol) Sister Mercy Hummer Desanctis   . Reflux disease Sister Mercy Hummer Desanctis   . High blood pressure (Hypertension) Sister Particia Hummer Cedar   . Hyperlipidemia (Elevated cholesterol) Sister Particia Hummer Cedar   . Reflux  disease Sister Particia Hummer Cedar   . Malignant hyperthermia Neg Hx      Review of Systems   Review of Systems  Positive for none  Negative for weight gain weight loss, weakness, vision change, hearing loss, cough, congestion, PND, orthopnea, heartburn, nausea, diaphoresis, vomiting, diarrhea, bloody stool, melena, stomach pain, extremity pain, leg weakness, leg cramping, leg blood clots, headache, blackouts, nosebleed, trouble swallowing, mouth pain, urinary frequency, urination at night, muscle weakness, skin lesions, skin rashes, tingling ,ulcers, numbness, anxiety, and/or depression Physical Examination   Vitals:BP (!) 140/90   Pulse 83   Ht 157.5 cm (5' 2)   Wt 87.1 kg (192 lb)   SpO2 93%   BMI 35.12 kg/m  Ht:157.5 cm (5' 2) Wt:87.1 kg (192 lb) ADJ:Anib surface area is 1.95 meters squared. Body mass index is 35.12 kg/m. Appearance: well appearing in no acute distress HEENT: Pupils equally reactive to light and accomodation,  no xanthalasma   Neck: Supple, no apparent thyromegaly, masses, or lymphadenopathy  Lungs: normal respiratory effort; no crackles, no rhonchi, no wheezes Heart: Regular rate and rhythm. Normal S1 S2  No gallops, murmur, no rub, PMI is normal size and placement. carotid upstroke normal without bruit. Jugular venous pressure is normal Abdomen: soft, nontender, not distended with normal bowel sounds. No apparent hepatosplenomegally. Abdominal aorta is normal size without bruit Extremities: no edema, no ulcers, no clubbing, no cyanosis Peripheral Pulses: 2+ in upper extremities, 2+ femoral pulses bilaterally, 2+lower extremity  Musculoskeletal;  Normal muscle tone without kyphosis Neurological:   Oriented and Alert, Cranial nerves intact  Assessment   78 y.o. female with  Encounter Diagnoses  Name Primary?  . Coronary artery disease involving native coronary artery of native heart without angina pectoris Yes  . Primary hypertension   . OSA on CPAP   .  Mixed hyperlipidemia   . Prediabetes   . Bilateral carotid artery stenosis       Plan   Assessment & Plan Heart failure Increased dyspnea and peripheral edema, right leg worse than left. No current diuretic therapy. Jardiance prescribed to manage fluid retention and improve A1c levels. - Prescribed Jardiance 10 mg for fluid retention and heart failure management.  Coronary artery disease Managed with telmisartan  hydrochlorothiazide . Blood pressure well-managed without need for additional antihypertensive adjustments.  Primary hypertension Blood pressure well-controlled with telmisartan  hydrochlorothiazide . No need for additional antihypertensive adjustments.  Mixed hyperlipidemia Atorvastatin  causing significant myalgia, taken three times a week. Plan to switch to Zetia to manage cholesterol without causing muscle pain. - Discontinued atorvastatin . - Prescribed Zetia for cholesterol management.  Prediabetes Jardiance prescribed to help manage A1c levels and prevent progression to diabetes. - Prescribed Jardiance 10 mg to help manage A1c levels.   No orders of the defined types were placed in this encounter.   Return in about 3 months (around 01/06/2025).   Attestation Statement:   I personally performed the service, non-incident to. (WP)   SABINA CUSTOVIC, DO

## 2024-10-11 ENCOUNTER — Emergency Department

## 2024-10-11 ENCOUNTER — Inpatient Hospital Stay
Admission: EM | Admit: 2024-10-11 | Discharge: 2024-10-15 | DRG: 189 | Disposition: A | Attending: Hospitalist | Admitting: Hospitalist

## 2024-10-11 ENCOUNTER — Other Ambulatory Visit: Payer: Self-pay

## 2024-10-11 DIAGNOSIS — I10 Essential (primary) hypertension: Secondary | ICD-10-CM | POA: Diagnosis present

## 2024-10-11 DIAGNOSIS — N390 Urinary tract infection, site not specified: Secondary | ICD-10-CM | POA: Diagnosis present

## 2024-10-11 DIAGNOSIS — E785 Hyperlipidemia, unspecified: Secondary | ICD-10-CM | POA: Diagnosis present

## 2024-10-11 DIAGNOSIS — Z716 Tobacco abuse counseling: Secondary | ICD-10-CM

## 2024-10-11 DIAGNOSIS — J441 Chronic obstructive pulmonary disease with (acute) exacerbation: Secondary | ICD-10-CM | POA: Diagnosis present

## 2024-10-11 DIAGNOSIS — G4733 Obstructive sleep apnea (adult) (pediatric): Secondary | ICD-10-CM

## 2024-10-11 DIAGNOSIS — G2581 Restless legs syndrome: Secondary | ICD-10-CM | POA: Diagnosis present

## 2024-10-11 DIAGNOSIS — J988 Other specified respiratory disorders: Principal | ICD-10-CM

## 2024-10-11 DIAGNOSIS — F32A Depression, unspecified: Secondary | ICD-10-CM | POA: Diagnosis present

## 2024-10-11 DIAGNOSIS — K219 Gastro-esophageal reflux disease without esophagitis: Secondary | ICD-10-CM | POA: Diagnosis present

## 2024-10-11 DIAGNOSIS — M7989 Other specified soft tissue disorders: Secondary | ICD-10-CM

## 2024-10-11 DIAGNOSIS — R0902 Hypoxemia: Secondary | ICD-10-CM

## 2024-10-11 LAB — PROTIME-INR
INR: 1 (ref 0.8–1.2)
Prothrombin Time: 13.3 s (ref 11.4–15.2)

## 2024-10-11 LAB — URINALYSIS, W/ REFLEX TO CULTURE (INFECTION SUSPECTED)
Bilirubin Urine: NEGATIVE
Glucose, UA: 150 mg/dL — AB
Ketones, ur: NEGATIVE mg/dL
Nitrite: POSITIVE — AB
Protein, ur: 100 mg/dL — AB
Specific Gravity, Urine: 1.01 (ref 1.005–1.030)
pH: 5 (ref 5.0–8.0)

## 2024-10-11 LAB — RESP PANEL BY RT-PCR (RSV, FLU A&B, COVID)  RVPGX2
Influenza A by PCR: NEGATIVE
Influenza B by PCR: NEGATIVE
Resp Syncytial Virus by PCR: NEGATIVE
SARS Coronavirus 2 by RT PCR: NEGATIVE

## 2024-10-11 LAB — CBC
HCT: 35.9 % — ABNORMAL LOW (ref 36.0–46.0)
Hemoglobin: 11.6 g/dL — ABNORMAL LOW (ref 12.0–15.0)
MCH: 32 pg (ref 26.0–34.0)
MCHC: 32.3 g/dL (ref 30.0–36.0)
MCV: 98.9 fL (ref 80.0–100.0)
Platelets: 200 K/uL (ref 150–400)
RBC: 3.63 MIL/uL — ABNORMAL LOW (ref 3.87–5.11)
RDW: 14 % (ref 11.5–15.5)
WBC: 6.5 K/uL (ref 4.0–10.5)
nRBC: 0 % (ref 0.0–0.2)

## 2024-10-11 LAB — CBC WITH DIFFERENTIAL/PLATELET
Abs Immature Granulocytes: 0.04 K/uL (ref 0.00–0.07)
Basophils Absolute: 0 K/uL (ref 0.0–0.1)
Basophils Relative: 0 %
Eosinophils Absolute: 0 K/uL (ref 0.0–0.5)
Eosinophils Relative: 0 %
HCT: 37.6 % (ref 36.0–46.0)
Hemoglobin: 12.1 g/dL (ref 12.0–15.0)
Immature Granulocytes: 1 %
Lymphocytes Relative: 21 %
Lymphs Abs: 1.6 K/uL (ref 0.7–4.0)
MCH: 31.8 pg (ref 26.0–34.0)
MCHC: 32.2 g/dL (ref 30.0–36.0)
MCV: 98.9 fL (ref 80.0–100.0)
Monocytes Absolute: 0.9 K/uL (ref 0.1–1.0)
Monocytes Relative: 12 %
Neutro Abs: 5 K/uL (ref 1.7–7.7)
Neutrophils Relative %: 66 %
Platelets: 210 K/uL (ref 150–400)
RBC: 3.8 MIL/uL — ABNORMAL LOW (ref 3.87–5.11)
RDW: 14 % (ref 11.5–15.5)
WBC: 7.7 K/uL (ref 4.0–10.5)
nRBC: 0 % (ref 0.0–0.2)

## 2024-10-11 LAB — BLOOD GAS, VENOUS
Acid-Base Excess: 1.2 mmol/L (ref 0.0–2.0)
Bicarbonate: 26 mmol/L (ref 20.0–28.0)
O2 Saturation: 91.4 %
Patient temperature: 37
pCO2, Ven: 41 mmHg — ABNORMAL LOW (ref 44–60)
pH, Ven: 7.41 (ref 7.25–7.43)
pO2, Ven: 57 mmHg — ABNORMAL HIGH (ref 32–45)

## 2024-10-11 LAB — COMPREHENSIVE METABOLIC PANEL WITH GFR
ALT: 19 U/L (ref 0–44)
AST: 25 U/L (ref 15–41)
Albumin: 4.2 g/dL (ref 3.5–5.0)
Alkaline Phosphatase: 62 U/L (ref 38–126)
Anion gap: 13 (ref 5–15)
BUN: 13 mg/dL (ref 8–23)
CO2: 24 mmol/L (ref 22–32)
Calcium: 8.9 mg/dL (ref 8.9–10.3)
Chloride: 103 mmol/L (ref 98–111)
Creatinine, Ser: 1.22 mg/dL — ABNORMAL HIGH (ref 0.44–1.00)
GFR, Estimated: 46 mL/min — ABNORMAL LOW (ref >60)
Glucose, Bld: 111 mg/dL — ABNORMAL HIGH (ref 70–99)
Potassium: 3.8 mmol/L (ref 3.5–5.1)
Sodium: 140 mmol/L (ref 135–145)
Total Bilirubin: 0.2 mg/dL (ref 0.0–1.2)
Total Protein: 6.8 g/dL (ref 6.5–8.1)

## 2024-10-11 LAB — EXPECTORATED SPUTUM ASSESSMENT W GRAM STAIN, RFLX TO RESP C

## 2024-10-11 LAB — D-DIMER, QUANTITATIVE: D-Dimer, Quant: 0.84 ug{FEU}/mL — ABNORMAL HIGH (ref 0.00–0.50)

## 2024-10-11 LAB — CREATININE, SERUM
Creatinine, Ser: 1.17 mg/dL — ABNORMAL HIGH (ref 0.44–1.00)
GFR, Estimated: 48 mL/min — ABNORMAL LOW (ref >60)

## 2024-10-11 LAB — TSH: TSH: 1.93 u[IU]/mL (ref 0.350–4.500)

## 2024-10-11 LAB — LACTIC ACID, PLASMA: Lactic Acid, Venous: 1.1 mmol/L (ref 0.5–1.9)

## 2024-10-11 MED ORDER — IOHEXOL 350 MG/ML SOLN
75.0000 mL | Freq: Once | INTRAVENOUS | Status: AC | PRN
  Administered 2024-10-11: 75 mL via INTRAVENOUS

## 2024-10-11 MED ORDER — NICOTINE 7 MG/24HR TD PT24: 7.0000 mg | MEDICATED_PATCH | Freq: Every day | TRANSDERMAL | 0 refills | Status: AC

## 2024-10-11 MED ORDER — ALUM & MAG HYDROXIDE-SIMETH 200-200-20 MG/5ML PO SUSP: 30.0000 mL | Freq: Four times a day (QID) | ORAL | Status: DC | PRN

## 2024-10-11 MED ORDER — SODIUM CHLORIDE 0.9 % IV SOLN
100.0000 mg | Freq: Once | INTRAVENOUS | Status: AC
  Administered 2024-10-11: 100 mg via INTRAVENOUS
  Filled 2024-10-11: qty 100

## 2024-10-11 MED ORDER — PREDNISONE 20 MG PO TABS
40.0000 mg | ORAL_TABLET | Freq: Every day | ORAL | Status: DC
  Administered 2024-10-12: 40 mg via ORAL
  Filled 2024-10-11: qty 2

## 2024-10-11 MED ORDER — PREGABALIN 50 MG PO CAPS
100.0000 mg | ORAL_CAPSULE | Freq: Three times a day (TID) | ORAL | Status: DC
  Administered 2024-10-11 – 2024-10-15 (×13): 100 mg via ORAL
  Filled 2024-10-11 (×13): qty 2

## 2024-10-11 MED ORDER — SODIUM CHLORIDE 0.9 % IV SOLN
1.0000 g | Freq: Once | INTRAVENOUS | Status: AC
  Administered 2024-10-11: 1 g via INTRAVENOUS
  Filled 2024-10-11: qty 10

## 2024-10-11 MED ORDER — IPRATROPIUM-ALBUTEROL 0.5-2.5 (3) MG/3ML IN SOLN
6.0000 mL | Freq: Once | RESPIRATORY_TRACT | Status: AC
  Administered 2024-10-11: 6 mL via RESPIRATORY_TRACT
  Filled 2024-10-11: qty 3

## 2024-10-11 MED ORDER — IRBESARTAN 150 MG PO TABS
75.0000 mg | ORAL_TABLET | Freq: Every day | ORAL | Status: DC
  Administered 2024-10-11 – 2024-10-15 (×5): 75 mg via ORAL
  Filled 2024-10-11 (×6): qty 1

## 2024-10-11 MED ORDER — NICOTINE POLACRILEX 4 MG MT LOZG: 4.0000 mg | LOZENGE | OROMUCOSAL | 0 refills | Status: AC | PRN

## 2024-10-11 MED ORDER — ONDANSETRON HCL 4 MG PO TABS
4.0000 mg | ORAL_TABLET | Freq: Four times a day (QID) | ORAL | Status: DC | PRN
  Administered 2024-10-15: 4 mg via ORAL
  Filled 2024-10-11: qty 1

## 2024-10-11 MED ORDER — ACETAMINOPHEN 325 MG PO TABS
650.0000 mg | ORAL_TABLET | Freq: Four times a day (QID) | ORAL | Status: DC | PRN
  Administered 2024-10-11: 650 mg via ORAL
  Filled 2024-10-11: qty 2

## 2024-10-11 MED ORDER — LEVOTHYROXINE SODIUM 50 MCG PO TABS
75.0000 ug | ORAL_TABLET | Freq: Every day | ORAL | Status: DC
  Administered 2024-10-12 – 2024-10-15 (×4): 75 ug via ORAL
  Filled 2024-10-11 (×4): qty 2

## 2024-10-11 MED ORDER — CITALOPRAM HYDROBROMIDE 20 MG PO TABS
20.0000 mg | ORAL_TABLET | Freq: Every day | ORAL | Status: DC
  Administered 2024-10-11 – 2024-10-15 (×5): 20 mg via ORAL
  Filled 2024-10-11 (×5): qty 1

## 2024-10-11 MED ORDER — POLYETHYLENE GLYCOL 3350 17 G PO PACK: 17.0000 g | PACK | Freq: Every day | ORAL | Status: DC | PRN

## 2024-10-11 MED ORDER — ONDANSETRON HCL 4 MG/2ML IJ SOLN
4.0000 mg | Freq: Four times a day (QID) | INTRAMUSCULAR | Status: DC | PRN
  Administered 2024-10-11 – 2024-10-13 (×5): 4 mg via INTRAVENOUS
  Filled 2024-10-11 (×5): qty 2

## 2024-10-11 MED ORDER — SODIUM CHLORIDE 0.9 % IV SOLN
100.0000 mg | Freq: Two times a day (BID) | INTRAVENOUS | Status: DC
  Administered 2024-10-11 – 2024-10-15 (×8): 100 mg via INTRAVENOUS
  Filled 2024-10-11 (×8): qty 100

## 2024-10-11 MED ORDER — ENOXAPARIN SODIUM 40 MG/0.4ML IJ SOSY
40.0000 mg | PREFILLED_SYRINGE | INTRAMUSCULAR | Status: DC
  Administered 2024-10-11 – 2024-10-15 (×5): 40 mg via SUBCUTANEOUS
  Filled 2024-10-11 (×5): qty 0.4

## 2024-10-11 MED ORDER — METHYLPREDNISOLONE SODIUM SUCC 40 MG IJ SOLR
40.0000 mg | Freq: Two times a day (BID) | INTRAMUSCULAR | Status: AC
  Administered 2024-10-11 (×2): 40 mg via INTRAVENOUS
  Filled 2024-10-11 (×2): qty 1

## 2024-10-11 MED ORDER — IPRATROPIUM-ALBUTEROL 0.5-2.5 (3) MG/3ML IN SOLN: 3.0000 mL | Freq: Four times a day (QID) | RESPIRATORY_TRACT | Status: DC | PRN

## 2024-10-11 MED ORDER — SODIUM CHLORIDE 0.9 % IV SOLN: 2.0000 g | Freq: Two times a day (BID) | INTRAVENOUS | Status: DC

## 2024-10-11 MED ORDER — SODIUM CHLORIDE 0.9 % IV SOLN
1.0000 g | INTRAVENOUS | Status: DC
  Administered 2024-10-12 – 2024-10-15 (×4): 1 g via INTRAVENOUS
  Filled 2024-10-11 (×4): qty 10

## 2024-10-11 MED ORDER — OXYCODONE HCL 5 MG PO TABS
5.0000 mg | ORAL_TABLET | ORAL | Status: DC | PRN
  Administered 2024-10-11 – 2024-10-14 (×4): 5 mg via ORAL
  Filled 2024-10-11 (×4): qty 1

## 2024-10-11 MED ORDER — CITALOPRAM HYDROBROMIDE 20 MG PO TABS: 20.0000 mg | ORAL_TABLET | Freq: Every day | ORAL | Status: DC

## 2024-10-11 MED ORDER — PANTOPRAZOLE SODIUM 40 MG IV SOLR
40.0000 mg | Freq: Two times a day (BID) | INTRAVENOUS | Status: DC
  Administered 2024-10-11 – 2024-10-12 (×3): 40 mg via INTRAVENOUS
  Filled 2024-10-11 (×3): qty 10

## 2024-10-11 MED ORDER — ACETAMINOPHEN 650 MG RE SUPP: 650.0000 mg | Freq: Four times a day (QID) | RECTAL | Status: DC | PRN

## 2024-10-11 MED ORDER — MORPHINE SULFATE (PF) 2 MG/ML IV SOLN
2.0000 mg | INTRAVENOUS | Status: DC | PRN
  Administered 2024-10-12 – 2024-10-14 (×3): 2 mg via INTRAVENOUS
  Filled 2024-10-11 (×3): qty 1

## 2024-10-11 MED ORDER — LEVOTHYROXINE SODIUM 50 MCG PO TABS: 75.0000 ug | ORAL_TABLET | Freq: Every day | ORAL | Status: DC

## 2024-10-11 MED ORDER — LACTATED RINGERS IV BOLUS (SEPSIS)
1000.0000 mL | Freq: Once | INTRAVENOUS | Status: AC
  Administered 2024-10-11: 1000 mL via INTRAVENOUS

## 2024-10-11 MED ORDER — ALBUTEROL SULFATE (2.5 MG/3ML) 0.083% IN NEBU
2.5000 mg | INHALATION_SOLUTION | Freq: Once | RESPIRATORY_TRACT | Status: AC
  Administered 2024-10-11: 2.5 mg via RESPIRATORY_TRACT
  Filled 2024-10-11: qty 3

## 2024-10-11 NOTE — Plan of Care (Signed)
  Problem: Education: Goal: Knowledge of General Education information will improve Description: Including pain rating scale, medication(s)/side effects and non-pharmacologic comfort measures Outcome: Progressing   Problem: Clinical Measurements: Goal: Ability to maintain clinical measurements within normal limits will improve Outcome: Progressing Goal: Will remain free from infection Outcome: Progressing Goal: Diagnostic test results will improve Outcome: Progressing Goal: Respiratory complications will improve Outcome: Progressing Goal: Cardiovascular complication will be avoided Outcome: Progressing   Problem: Activity: Goal: Risk for activity intolerance will decrease Outcome: Progressing   Problem: Nutrition: Goal: Adequate nutrition will be maintained Outcome: Progressing   Problem: Pain Managment: Goal: General experience of comfort will improve and/or be controlled Outcome: Progressing   Problem: Safety: Goal: Ability to remain free from injury will improve Outcome: Progressing   Problem: Skin Integrity: Goal: Risk for impaired skin integrity will decrease Outcome: Progressing

## 2024-10-11 NOTE — ED Provider Notes (Signed)
 .----------------------------------------- 7:28 AM on 10/11/2024 -----------------------------------------  Blood pressure (!) 166/72, pulse (!) 102, temperature 99.5 F (37.5 C), temperature source Oral, resp. rate 17, height 5' 3 (1.6 m), weight 83.9 kg, SpO2 91%.  Assuming care from Dr. Cyrena.  In short, Sherri Peters is a 78 y.o. female with a chief complaint of productive cough and shortness of breath.  Refer to the original H&P for additional details.  The current plan of care is to follow-up imaging studies, likely admission for COPD exacerbation.  On assessment patient still feels some shortness of breath, she is wheezing on the left, will give her an albuterol  neb here.  Discussed with her about imaging lab results, we are still pending her CT PE study.  Will plan to have her admitted for COPD exacerbation and hypoxia.  UA is consistent with UTI.  Will give her a dose of ceftriaxone  here, will also give her dose of doxycycline  for COPD exacerbation.  Will consult hospitalist for admission given her hypoxia and COPD.  She is admitted.  SABRACRITICAL CARE Performed by: Lorelle Jerri Cheadle   Total critical care time: 35 minutes  Critical care time was exclusive of separately billable procedures and treating other patients.  Critical care was necessary to treat or prevent imminent or life-threatening deterioration.  Critical care was time spent personally by me on the following activities: development of treatment plan with patient and/or surrogate as well as nursing, discussions with consultants, evaluation of patient's response to treatment, examination of patient, obtaining history from patient or surrogate, ordering and performing treatments and interventions, ordering and review of laboratory studies, ordering and review of radiographic studies, pulse oximetry and re-evaluation of patient's condition.    Clinical Course as of 10/11/24 9078  Sat Oct 11, 2024  0727 Independent review of labs,  D-dimer is elevated, ordered ultrasound as well as CT PE study.  Electrolytes not severely deranged, lactate is normal, no leukocytosis. [TT]  9272 DG Chest 2 View  IMPRESSION: 1. No acute findings. 2. Hyperinflation and chronic interstitial prominence, unchanged.   [TT]  [id number] Patient now hypoxic to 87%, placed on 4 L nasal cannula. [TT]  9191 US  Venous Img Lower Bilateral 1. No evidence of DVT.  [TT]    Clinical Course User Index [TT] C[place]le Lorelle Jerri, MD     Medications  albuterol  (PROVENTIL ) (2.5 MG/3ML) 0.083% nebulizer solution 2.5 mg (has no administration in time range)  cefTRIAXone  (ROCEPHIN ) 1 g in sodium chloride  0.9 % 100 mL IVPB (has no administration in time range)  doxycycline  (VIBRAMYCIN ) 100 mg in sodium chloride  0.9 % 250 mL IVPB (has no administration in time range)  lactated ringers  bolus 1,000 mL (0 mLs Intravenous Stopped 10/11/24 0717)  ipratropium-albuterol  (DUONEB) 0.5-2.5 (3) MG/3ML nebulizer solution 6 mL (6 mLs Nebulization Given 10/11/24 0647)  iohexol  (OMNIPAQUE ) 350 MG/ML injection 75 mL (75 mLs Intravenous Contrast Given 10/11/24 0835)     ED Discharge Orders          Ordered    nicotine  (NICODERM CQ  - DOSED IN MG/24 HR) 7 mg/24hr patch  Daily        10/11/24 0634    nicotine  polacrilex (NICOTINE  MINI) 4 MG lozenge  As needed        10/11/24 9365           Final diagnoses:  Respiratory infection  Acute exacerbation of chronic obstructive pulmonary disease (COPD) (HCC)  Leg swelling  Encounter for smoking cessation counseling  Hypoxia  [place] Lorelle Cummins, MD 10/11/24 [phone]

## 2024-10-11 NOTE — ED Notes (Signed)
 Informed RN bed assigned

## 2024-10-11 NOTE — ED Provider Notes (Signed)
 [facility] Provider Note    None    (approximate)   History   Shortness of Breath   HPI  Sherri Peters is a 78 y.o. female   Past medical history of current smoker, COPD, no home oxygen, hypertension, hyperlipidemia, hypothyroid, heart failure with preserved ejection fraction, CAD, here with productive cough and nasal congestion over the last several days, worsening shortness of breath.  No chest pain.  Has had bilateral leg swelling chronically, right worse than left, seen by cardiologist at the beginning of this month for this problem.  No history of blood clot.  Denies chest pain.  No fever.  No GI or GU symptoms.  Poor p.o. intake over the last several days due to her sickness.  Looser stools no GI bleeding.  External Medical Documents Reviewed: Recent outpatient cardiology note      Physical Exam   Triage Vital Signs: ED Triage Vitals  Encounter Vitals Group     BP 10/11/24 0626 (!) 166/72     Girls Systolic BP Percentile --      Girls Diastolic BP Percentile --      Boys Systolic BP Percentile --      Boys Diastolic BP Percentile --      Pulse Rate 10/11/24 0622 (!) 102     Resp 10/11/24 0622 17     Temp 10/11/24 0622 99.5 F (37.5 C)     Temp Source 10/11/24 0622 Oral     SpO2 10/11/24 0622 96 %     Weight 10/11/24 0623 185 lb (83.9 kg)     Height 10/11/24 0623 5' 3 (1.6 m)     Head Circumference --      Peak Flow --      Pain Score 10/11/24 0623 0     Pain Loc --      Pain Education --      Exclude from Growth Chart --     Most recent vital signs: Vitals:   10/11/24 0626 10/11/24 0626  BP:  (!) 166/72  Pulse:    Resp:    Temp:    SpO2: 91%     General: Awake, no distress.  CV:  Good peripheral perfusion.  Resp:  Normal effort.  Abd:  No distention.  Other:  Somewhat tachypneic breathing 20-25 times per minute, oxygenation around 90% on room air, slightly tachycardic 100, slightly tacky mucous membranes and poor  skin turgor looks somewhat dehydrated.  Soft benign abdominal exam.  Lung sounds with wheezing throughout, no focality.  Mild bilateral lower extremity edema.  Nontender legs.  Skin feels warm well-perfused.   ED Results / Procedures / Treatments   Labs (all labs ordered are listed, but only abnormal results are displayed) Labs Reviewed  CBC WITH DIFFERENTIAL/PLATELET - Abnormal; Notable for the following components:      Result Value   RBC 3.80 (*)    All other components within normal limits  BLOOD GAS, VENOUS - Abnormal; Notable for the following components:   pCO2, Ven 41 (*)    pO2, Ven 57 (*)    All other components within normal limits  RESP PANEL BY RT-PCR (RSV, FLU A&B, COVID)  RVPGX2  CULTURE, BLOOD (ROUTINE X 2)  CULTURE, BLOOD (ROUTINE X 2)  LACTIC ACID, PLASMA  LACTIC ACID, PLASMA  COMPREHENSIVE METABOLIC PANEL WITH GFR  PROTIME-INR  URINALYSIS, W/ REFLEX TO CULTURE (INFECTION SUSPECTED)  D-DIMER, QUANTITATIVE     I ordered and reviewed the above labs  they are notable for no leukocytosis, normal pH and no hypercarbia on VBG  EKG  ED ECG REPORT I, Ginnie Shams, the attending physician, personally viewed and interpreted this ECG.   Date: 10/11/2024  EKG Time: 0622  Rate: 101  Rhythm: sinus tachycardia  Axis: nl  Intervals:nl   ST&T Change: no stemi    RADIOLOGY I independently reviewed and interpreted chest x-ray and I see no obvious focality or pneumothorax I also reviewed radiologist's formal read.   PROCEDURES:  Critical Care performed: No  Procedures   MEDICATIONS ORDERED IN ED: Medications  lactated ringers  bolus 1,000 mL (1,000 mLs Intravenous New Bag/Given 10/11/24 0647)  ipratropium-albuterol  (DUONEB) 0.5-2.5 (3) MG/3ML nebulizer solution 6 mL (6 mLs Nebulization Given 10/11/24 0647)   IMPRESSION / MDM / ASSESSMENT AND PLAN / ED COURSE  I reviewed the triage vital signs and the nursing notes.                                Patient's  presentation is most consistent with acute presentation with potential threat to life or bodily function.  Differential diagnosis includes, but is not limited to, respiratory infection, COPD exacerbation, bacterial pneumonia, PE, DVT   The patient is on the cardiac monitor to evaluate for evidence of arrhythmia and/or significant heart rate changes.  MDM:    Most likely respiratory infection and COPD exacerbation in this patient with productive cough nasal congestion over the last couple of days and increased shortness of breath with wheezing on auscultation.  Treated by EMS with IV Solu-Medrol  and a DuoNeb with some improvement of symptoms.  Will give another couple of DuoNebs here.  Check COVID and flu and RSV swabs.  Chest x-ray.    Given tachycardia but otherwise normal vital signs there is some concern for sepsis, will obtain full sepsis labs including blood cultures lactic.  Tachycardia [place] be driven by her albuterol  treatment and route.  She does look slightly dehydrated on clinical exam and with poor p.o. intake and loose stools over the last several days, I think she can use some fluids.  Last EF over 55%, doubt significant CHF exacerbation contributing symptoms and to stay think she can use a liter of fluid.  Will defer the full 30 cc/kg ideal body weight fluid bolus for sepsis given no evidence of hypoperfusion.  I will defer on antibiotics until further evaluation with labs/chest x-ray as a viral illness Mcmann explain her symptoms as well  I considered VTE given her right greater than left leg swelling.  I have lower suspicion for this given her respiratory infectious symptoms and wheezing.  I will start with D-dimer.  If positive can CT angiogram/DVT ultrasound  No significant respiratory distress will defer BiPAP and certainly does not need intubation at this time.  Oxygenation of 90 to 92% expected for this patient with COPD, will continue to monitor and if lowered will start on  oxygen.  I anticipate admission.   -  I spent 5 minutes counseling this patient on smoking cessation.  We spoke about the patient's current tobacco use, impact of smoking, assessed willingness to quit, methods for cessation including medical management and nicotine  replacement therapy (which I prescribed to the patient) and advised follow-up with primary doctor to continue to address smoking cessation.          FINAL CLINICAL IMPRESSION(S) / ED DIAGNOSES   Final diagnoses:  Respiratory infection  Acute exacerbation  of chronic obstructive pulmonary disease (COPD) (HCC)  Leg swelling  Encounter for smoking cessation counseling     Rx / DC Orders   ED Discharge Orders          Ordered    nicotine  (NICODERM CQ  - DOSED IN MG/24 HR) 7 mg/24hr patch  Daily        10/11/24 0634    nicotine  polacrilex (NICOTINE  MINI) 4 MG lozenge  As needed        10/11/24 9365             Note:  This document was prepared using Dragon voice recognition software and Ramaswamy include unintentional dictation errors.    Cyrena Mylar, MD 10/11/24 [phone]

## 2024-10-11 NOTE — H&P (Signed)
 History and Physical    Sherri Peters FMW:[id number] DOB: 12-30-1945 DOA: 10/11/2024  DOS: the patient was seen and examined on 10/11/2024  PCP: Johnie Perkins, PA-C   Patient coming from: Home  I have personally briefly reviewed patient's old medical records in [facility]  Chief Complaint: Shortness of breath  HPI: Sherri Peters is a pleasant 78 y.o. female with medical history significant for COPD not on home oxygen, HTN, HLD, hypothyroidism, heart failure with preserved ejection fraction, CAD and depression who came into ED complaining of productive cough, congestion and worsening shortness of breath over the last week.  She stated that this Monday she started feeling unwell.  She started having cough which was dry in the beginning and then started having some productive sputum, yellow no blood.  She also had congestion and she started getting progressive shortness of breath over that period of time.  She denies any history of leg swelling, chest pain, fever, palpitations, dysuria, hematuria, nausea, vomiting or diarrhea.  ED Course: Upon arrival to the ED, patient is found to be hypertensive at 166/72, tachycardic at 102, respiration of 17, saturating 91% on room air.  Chest x-ray showed no pneumonia.  EKG showed sinus tachycardia at 101 bpm.  She was given multiple dose of nebulizations initially her oxygen improved later again she went down to 87% on required 4 L of nasal cannula oxygen.  Her viral panel was negative.  Urine analysis was positive.  Her bilateral lower extremity ultrasound showed no DVT.  She was given multiple dose of nebulization, IV Solu-Medrol , ceftriaxone  and doxycycline  to cover for pneumonia and UTI.  Hospitalist service was consulted for evaluation for admission for possible COPD exacerbation and pneumonia.  Review of Systems:  ROS  All other systems negative except as noted in the HPI.  Past Medical History:  Diagnosis Date   Aortic atherosclerosis     Arthritis    Bilateral carotid artery disease    Carpal tunnel syndrome of left wrist    CKD (chronic kidney disease), stage II    COPD (chronic obstructive pulmonary disease) (HCC)    Coronary artery disease    Depression    Diastolic dysfunction    a.) TTE 11/15/2018: EF >55%, mild LVH, MAC, BAE, triv PR, mild MR/TR, G1DD; b.) TTE 09/12/2021: EF >55%, mild LVH, MAC, triv TR/PR, mod MR, G1DD   Endophthalmitis, right    Fibromyalgia    GERD (gastroesophageal reflux disease)    History of bilateral cataract extraction 06/2018   Hyperlipidemia    Hypertension    Hypothyroidism    OSA on CPAP    Pre-diabetes    RLS (restless legs syndrome)    Statin intolerance    Umbilical hernia    a.) s/p repair 11/10/2016    Past Surgical History:  Procedure Laterality Date   ABDOMINAL HYSTERECTOMY     APPENDECTOMY N/A    CATARACT EXTRACTION Bilateral 06/2018   CHOLECYSTECTOMY N/A 12/08/2019   COLONOSCOPY     COLONOSCOPY WITH PROPOFOL  N/A 02/05/2024   Procedure: COLONOSCOPY WITH PROPOFOL ;  Surgeon: Toledo, Ladell POUR, MD;  Location: [facility] END[facility]COPY;  Service: Gastroenterology;  Laterality: N/A;  DM   ESOPHAGOGASTRODUODENOSCOPY (EGD) WITH PROPOFOL  N/A 02/05/2024   Procedure: ESOPHAGOGASTRODUODENOSCOPY (EGD) WITH PROPOFOL ;  Surgeon: Toledo, Ladell POUR, MD;  Location: [facility] ENDOSCOPY;  Service: Gastroenterology;  Laterality: N/A;   HEMOSTASIS CLIP PLACEMENT  02/05/2024   Procedure: CONTROL OF HEMORRHAGE, GI TRACT, ENDOSCOPIC, BY CLIPPING OR OVERSEWING;  Surgeon: Aundria, Teodoro K, MD;  Location: [facility] ENDOSCOPY;  Service: Gastroenterology;;   KNEE ARTHROPLASTY Left 01/17/2023   Procedure: COMPUTER ASSISTED TOTAL KNEE ARTHROPLASTY;  Surgeon: Mardee Lynwood SQUIBB, MD;  Location: [facility] ORS;  Service: Orthopedics;  Laterality: Left;   POLYPECTOMY  02/05/2024   Procedure: POLYPECTOMY;  Surgeon: Toledo, Ladell POUR, MD;  Location: [facility] END[facility];  Service: Gastroenterology;;   TONSILLECTOMY Bilateral    TOTAL KNEE  ARTHROPLASTY Bilateral 06/03/2020   ARTHROPLASTY, KNEE, CONDYLE AND PLATEAU; MEDIAL AND LATERAL COMPARTMENTSWITH OR WITHOUT PATELLA RESURFACING (TOTAL KNEE ARTHROPLASTY); Surgeon: Sande Ozell Mt, MD; Location: [facility] OR; Service: Orthopedics; Laterality: Right;   UMBILICAL HERNIA REPAIR  11/10/2016   LAPAROSCOPY, SURGICAL; REPAIR, UMBILICAL HERNIA (INCLUDES MESH INSERTION, WHEN PERFORMED); REDUCIBLE - laparoscopic umbilical hernia repair. GETA; Surgeon: Ladora Hawthorn, MD; Location: [facility] OR; Service: General Surgery; Laterality: N/A     reports that she has been smoking cigarettes. She has never used smokeless tobacco. She reports that she does not drink alcohol and does not use drugs.  Allergies  Allergen Reactions   Duloxetine Diarrhea    N/V/D   Levaquin [Levofloxacin] Other (See Comments)    hallucinations   Fluoxetine Other (See Comments)   Gabapentin  Swelling   Meloxicam  Swelling   Tizanidine Other (See Comments)    Family History  Problem Relation Age of Onset   Heart disease Mother    Hypertension Mother    AAA (abdominal aortic aneurysm) Mother    Stroke Father    Heart disease Father    Hypertension Father    Aneurysm Sister     Prior to Admission medications   Medication Sig Start Date End Date Taking? Authorizing Provider  nicotine  (NICODERM CQ  - DOSED IN MG/24 HR) 7 mg/24hr patch Place 1 patch (7 mg total) onto the skin daily. 10/11/24 10/11/25 Yes Cyrena Mylar, MD  nicotine  polacrilex (NICOTINE  MINI) 4 MG lozenge Take 1 lozenge (4 mg total) by mouth as needed. 10/11/24  Yes Cyrena Mylar, MD  acetaminophen  (TYLENOL ) 650 MG CR tablet Take 650 mg by mouth every 8 (eight) hours as needed for pain.    [provider]  albuterol  (VENTOLIN  HFA) 108 (90 Base) MCG/ACT inhaler Inhale 2 puffs into the lungs every 6 (six) hours as needed for wheezing or shortness of breath. 07/31/21   Floy Roberts, MD  atorvastatin  (LIPITOR) 40 MG  tablet TAKE 1 TABLET BY MOUTH  DAILY Patient taking differently: Take 40 mg by mouth daily. 12/17/17   Montell Oneil LABOR, MD  B Complex-C (B-COMPLEX WITH VITAMIN C) tablet Take 1 tablet by mouth daily.    [provider]  buPROPion  (WELLBUTRIN  XL) 150 MG 24 hr tablet Take 150 mg by mouth daily. 05/31/19   [provider]  busPIRone  (BUSPAR ) 5 MG tablet Take by mouth daily. 04/06/22   [provider]  Cholecalciferol (VITAMIN D3) 50 MCG (2000 UT) capsule Take 2,000 Units by mouth daily.    [provider]  citalopram  (CELEXA ) 20 MG tablet Take 20 mg by mouth daily.    [provider]  hydrochlorothiazide  (HYDRODIURIL ) 25 MG tablet Take 25 mg by mouth daily. Patient not taking: Reported on 07/14/2024 05/21/24   [provider]  levothyroxine  (SYNTHROID , LEVOTHROID) 75 MCG tablet TAKE 1 TABLET BY MOUTH  DAILY Patient taking differently: Take 75 mcg by mouth daily before breakfast. 12/17/17   Montell Oneil LABOR, MD  ondansetron  (ZOFRAN -ODT) 4 MG disintegrating tablet Take 1 tablet (4 mg total) by mouth every 6 (six) hours as needed for nausea  or vomiting. 01/09/24   Arvis Huxley B, PA-C  pantoprazole  (PROTONIX ) 40 MG tablet Take 1 tablet (40 mg total) by mouth 2 (two) times daily for 7 days. 10/09/23 02/05/24  Laurita Pillion, MD  pregabalin  (LYRICA ) 100 MG capsule Take 1 capsule by mouth 3 (three) times daily. 03/02/22   [provider]  telmisartan  (MICARDIS ) 40 MG tablet Take 40 mg by mouth daily. 05/21/24   [provider]  telmisartan -hydrochlorothiazide  (MICARDIS  HCT) 80-12.5 MG tablet TAKE 1 TABLET BY MOUTH  DAILY 12/17/17   Montell Oneil LABOR, MD  triamcinolone cream (KENALOG) 0.1 % Apply 1 Application topically 2 (two) times daily. Patient not taking: Reported on 01/17/2023    [provider]    Physical Exam: Vitals:   10/11/24 1030 10/11/24 1100 10/11/24 1130 10/11/24 1200  BP: (!) 142/99 (!) 163/83 (!) 171/90 (!) 176/86  Pulse:  99 99 96 99  Resp: 13 10 (!) 28 11  Temp:      TempSrc:      SpO2: (!) 85% 91% 92% 91%  Weight:      Height:        Physical Exam   Constitutional: Alert, awake, calm, comfortable HEENT: Neck supple Respiratory: Bilateral extensive wheezes, no rales no rhonchi Cardiovascular: Regular rate and rhythm, no murmurs / rubs / gallops. No extremity edema. 2+ pedal pulses. No carotid bruits.  Abdomen: Soft, no tenderness, Bowel sounds positive.  Musculoskeletal: no clubbing / cyanosis. Good ROM, no contractures. Normal muscle tone.  Skin: no rashes, lesions, ulcers. Neurologic: CN 2-12 grossly intact. Sensation intact, No focal deficit identified Psychiatric: Alert and oriented x 3. Normal mood.    Labs on Admission: I have personally reviewed following labs and imaging studies  CBC: Recent Labs  Lab 10/11/24 0635 10/11/24 0935  WBC 7.7 6.5  NEUTROABS 5.0  --   HGB 12.1 11.6*  HCT 37.6 35.9*  MCV 98.9 98.9  PLT 210 200   Basic Metabolic Panel: Recent Labs  Lab 10/11/24 0635 10/11/24 0935  NA 140  --   K 3.8  --   CL 103  --   CO2 24  --   GLUCOSE 111*  --   BUN 13  --   CREATININE 1.22* 1.17*  CALCIUM  8.9  --    GFR: Estimated Creatinine Clearance: 42 mL/min (A) (by C-G formula based on SCr of 1.17 mg/dL (H)). Liver Function Tests: Recent Labs  Lab 10/11/24 0635  AST 25  ALT 19  ALKPHOS 62  BILITOT 0.2  PROT 6.8  ALBUMIN 4.2   No results for input(s): LIPASE, AMYLASE in the last 168 hours. No results for input(s): AMMONIA in the last 168 hours. Coagulation Profile: Recent Labs  Lab 10/11/24 0635  INR 1.0   Cardiac Enzymes: No results for input(s): CKTOTAL, CKMB, CKMBINDEX, TROPONINI, TROPONINIHS in the last 168 hours. BNP (last 3 results) No results for input(s): BNP in the last 8760 hours. HbA1C: No results for input(s): HGBA1C in the last 72 hours. CBG: No results for input(s): GLUCAP in the last 168 hours. Lipid  Profile: No results for input(s): CHOL, HDL, LDLCALC, TRIG, CHOLHDL, LDLDIRECT in the last 72 hours. Thyroid Function Tests: No results for input(s): TSH, T4TOTAL, FREET4, T3FREE, THYROIDAB in the last 72 hours. Anemia Panel: No results for input(s): VITAMINB12, FOLATE, FERRITIN, TIBC, IRON, RETICCTPCT in the last 72 hours. Urine analysis:    Component Value Date/Time   COLORURINE YELLOW (A) 10/11/2024 0746   APPEARANCEUR HAZY (A) 10/11/2024 9253  APPEARANCEUR Clear 08/24/2016 1409   LABSPEC 1.010 10/11/2024 0746   PHURINE 5.0 10/11/2024 0746   GLUCOSEU 150 (A) 10/11/2024 0746   HGBUR SMALL (A) 10/11/2024 0746   BILIRUBINUR NEGATIVE 10/11/2024 0746   BILIRUBINUR Negative 08/24/2016 1409   KETONESUR NEGATIVE 10/11/2024 0746   PROTEINUR 100 (A) 10/11/2024 0746   NITRITE POSITIVE (A) 10/11/2024 0746   LEUKOCYTESUR TRACE (A) 10/11/2024 0746    Radiological Exams on Admission: I have personally reviewed images CT Angio Chest PE W/Cm &/Or Wo Cm Result Date: 10/11/2024 EXAM: CTA of the Chest with contrast for PE 10/11/2024 08:51:43 AM TECHNIQUE: CTA of the chest was performed after the administration of 75 mL of iohexol  (OMNIPAQUE ) 350 MG/ML injection. Multiplanar reformatted images are provided for review. MIP images are provided for review. Automated exposure control, iterative reconstruction, and/or weight based adjustment of the mA/kV was utilized to reduce the radiation dose to as low as reasonably achievable. COMPARISON: None available. CLINICAL HISTORY: Pulmonary embolism (PE) suspected, low to intermediate prob, positive D-dimer. FINDINGS: PULMONARY ARTERIES: Pulmonary arteries are adequately opacified for evaluation. No pulmonary embolism. Main pulmonary artery is normal in caliber. MEDIASTINUM: Coronary artery calcifications. Extensive mitral valve calcifications. Trace pericardial effusion. Aortic atherosclerosis. There is no acute abnormality of the  thoracic aorta. LYMPH NODES: No mediastinal or axillary lymphadenopathy. Prominent bilateral hilar lymph nodes are identified including a 1.2 cm left hilar lymph node, image 169/6, and a 1.1 cm right hilar lymph node, image 168/6. LUNGS AND PLEURA: The trachea appears patent. Filling defects within the right middle lobe bronchi identified. Asymmetric increased peribronchovascular soft tissue at the origin of the right middle lobe bronchus is identified. The increased soft tissue within this area measures approximately 2.3 x 1.8 cm, image 73/4 and coronal image 74/7. Scattered endoluminal filling defects of the proximal right middle lobe bronchus possibly reflecting aspirated debris or mucous plugging. Partial atelectasis of the right middle lobe is noted. Patchy area of geographic ground glass attenuation is noted within the posterobasal right upper lobe compatible with a nonspecific pneumonitis, axial image 56/5. Scattered areas of tree-in-bud nodularity noted within both lungs compatible with an inflammatory or infectious bronchiolitis. Focal area of subpleural focal scarring or atelectasis within the medial left apex is noted, axial image 20/5. No significant pleural effusion identified. No pneumothorax. UPPER ABDOMEN: Limited images of the upper abdomen demonstrate status post cholecystectomy. Bosniak class 1 cyst off the posterior cortex of the left kidney measures 2.1 cm. SOFT TISSUES AND BONES: No acute soft tissue abnormality. Multilevel degenerative disc disease and mild scoliosis within the thoracic spine. IMPRESSION: 1. No pulmonary embolism. 2. Partial right middle lobe atelectasis with asymmetric peribronchovascular soft tissue and endoluminal filling defects in the proximal right middle lobe bronchus, favored aspirated debris or mucous plugging. Follow-up imaging is advised to ensure resolution of the increased peribronchovascular soft tissue about the proximal right middle lobe bronchus. Evaluation to  exclude underlying malignancy. Specifically, repeat CT of the chest with contrast material in 1 month is advised following resolution of patient's symptoms. 3. Geographic ground-glass attenuation in the posterobasal right upper lobe, compatible with nonspecific pneumonitis. 4. Scattered tree-in-bud nodularity in both lungs, compatible with inflammatory or infectious bronchiolitis. 5. Prominent bilateral hilar lymph nodes. Favor reactive adenopathy in the setting of inflammation or infection. 6. Aortic atherosclerosis with coronary artery calcifications. Electronically signed by: Waddell Calk MD 10/11/2024 09:48 AM EST RP Workstation: [id number]   US  Venous Img Lower Bilateral Result Date: 10/11/2024 EXAM: ULTRASOUND DUPLEX OF THE BILATERAL LOWER  EXTREMITY VEINS TECHNIQUE: Duplex ultrasound using B-mode/gray scaled imaging and Doppler spectral analysis and color flow was obtained of the deep venous structures of the bilateral lower extremity. COMPARISON: None available. CLINICAL HISTORY: Leg swelling Leg swelling FINDINGS: The common femoral vein, femoral vein, popliteal vein, and posterior tibial vein of the _laterality_ lower extremity demonstrate normal compressibility with normal color flow and spectral analysis. IMPRESSION: 1. No evidence of DVT. Electronically signed by: Evalene Coho MD 10/11/2024 07:59 AM EST RP Workstation: [id number]   DG Chest 2 View Result Date: 10/11/2024 EXAM: 2 VIEW(S) XRAY OF THE CHEST 10/11/2024 06:43:30 AM COMPARISON: 10/08/2023 CLINICAL HISTORY: SOB FINDINGS: LUNGS AND PLEURA: Hyperinflation. Chronic interstitial prominence, unchanged. No pleural effusion. No pneumothorax. HEART AND MEDIASTINUM: No acute abnormality of the cardiac and mediastinal silhouettes. BONES AND SOFT TISSUES: No acute osseous abnormality. IMPRESSION: 1. No acute findings. 2. Hyperinflation and chronic interstitial prominence, unchanged. Electronically signed by: Waddell Calk MD 10/11/2024 06:47 AM  EST RP Workstation: [id number]    EKG: My personal interpretation of EKG shows: Sinus tachycardia at 101 bpm, no ST elevation    Assessment/Plan Principal Problem:   COPD exacerbation (HCC) Active Problems:   Hyperlipidemia   Hypertension   Depression   OSA on CPAP   Restless leg syndrome   Gastroesophageal reflux disease without esophagitis   Sepsis secondary to UTI [facility])    Assessment and Plan: 78 year old female with multiple medical problems including but not limited to COPD not on home oxygen, HTN, HLD, obstructive sleep apnea on CPAP, restless leg syndrome, GERD, depression who came into ED complaining of cough and shortness of breath for a week duration.  1.  Acute hypoxemic respiratory failure likely due to COPD exacerbation - She will be admitted to hospital as inpatient. - She was given nebulizations, IV steroid, antibiotics in the ED. - Will continue nebulization, IV steroid, ceftriaxone  and doxycycline . - Will continue to follow the cultures - She will continue oxygen to maintain saturation more than 90%. - She will also be given CPAP if needed but she will get that during the night while sleeping.  2.  Urinary tract infection - She does not meet criteria for sepsis. -She has normal lactate, normal leukocytes - She will be continued on antibiotics ceftriaxone  - Will follow the cultures.  3.  Obstructive sleep apnea - Will continue CPAP  4.  HTN/HLD - Resume home medications - Monitor blood pressure  5.  Restless leg syndrome/depression - Resume her home medications  6. GERD -Protonix  and Maalox  7. Depression: Continue Home Celexa      DVT prophylaxis: Lovenox  Code Status: DNR/DNI Family Communication: None  Disposition Plan: Home  Consults called: None  Admission status: Inpatient, Telemetry bed   Nena Rebel, MD [facility] 10/11/2024, 12:25 PM

## 2024-10-11 NOTE — ED Triage Notes (Signed)
 Patient brought in by EMS from home for SoB with productive cough worsening since Wednesday. L shoulder blade pain since yesterday ~2200, diarrhea since yesterday ~2200. On EMS arrival 90% on RA, pt reports O2 90-91% baseline. EMS gave albuterol  x1, duo neb x1, solumedrol 125mg . Pt AxOx4 in triage. Hx COPD. 6L [place] on arrival.

## 2024-10-11 NOTE — Progress Notes (Signed)
 Pt did not bring CPAP from home. Pt refused ours tonight but if she needs it she will let us  know tonight.

## 2024-10-12 DIAGNOSIS — J441 Chronic obstructive pulmonary disease with (acute) exacerbation: Secondary | ICD-10-CM

## 2024-10-12 LAB — CBC
HCT: 34.3 % — ABNORMAL LOW (ref 36.0–46.0)
Hemoglobin: 11.1 g/dL — ABNORMAL LOW (ref 12.0–15.0)
MCH: 32.1 pg (ref 26.0–34.0)
MCHC: 32.4 g/dL (ref 30.0–36.0)
MCV: 99.1 fL (ref 80.0–100.0)
Platelets: 197 K/uL (ref 150–400)
RBC: 3.46 MIL/uL — ABNORMAL LOW (ref 3.87–5.11)
RDW: 13.8 % (ref 11.5–15.5)
WBC: 5.9 K/uL (ref 4.0–10.5)
nRBC: 0 % (ref 0.0–0.2)

## 2024-10-12 LAB — PROTIME-INR
INR: 1 (ref 0.8–1.2)
Prothrombin Time: 13.7 s (ref 11.4–15.2)

## 2024-10-12 LAB — COMPREHENSIVE METABOLIC PANEL WITH GFR
ALT: 18 U/L (ref 0–44)
AST: 34 U/L (ref 15–41)
Albumin: 3.9 g/dL (ref 3.5–5.0)
Alkaline Phosphatase: 54 U/L (ref 38–126)
Anion gap: 12 (ref 5–15)
BUN: 14 mg/dL (ref 8–23)
CO2: 25 mmol/L (ref 22–32)
Calcium: 9 mg/dL (ref 8.9–10.3)
Chloride: 102 mmol/L (ref 98–111)
Creatinine, Ser: 1.11 mg/dL — ABNORMAL HIGH (ref 0.44–1.00)
GFR, Estimated: 51 mL/min — ABNORMAL LOW (ref >60)
Glucose, Bld: 133 mg/dL — ABNORMAL HIGH (ref 70–99)
Potassium: 4 mmol/L (ref 3.5–5.1)
Sodium: 138 mmol/L (ref 135–145)
Total Bilirubin: 0.2 mg/dL (ref 0.0–1.2)
Total Protein: 6.3 g/dL — ABNORMAL LOW (ref 6.5–8.1)

## 2024-10-12 MED ORDER — METHYLPREDNISOLONE SODIUM SUCC 40 MG IJ SOLR
40.0000 mg | Freq: Two times a day (BID) | INTRAMUSCULAR | Status: DC
  Administered 2024-10-12 – 2024-10-15 (×7): 40 mg via INTRAVENOUS
  Filled 2024-10-12 (×7): qty 1

## 2024-10-12 MED ORDER — GUAIFENESIN ER 600 MG PO TB12
600.0000 mg | ORAL_TABLET | Freq: Two times a day (BID) | ORAL | Status: DC
  Administered 2024-10-12 – 2024-10-13 (×3): 600 mg via ORAL
  Filled 2024-10-12 (×3): qty 1

## 2024-10-12 MED ORDER — DOXYLAMINE SUCCINATE (SLEEP) 25 MG PO TABS
25.0000 mg | ORAL_TABLET | Freq: Every day | ORAL | Status: DC
  Administered 2024-10-12 – 2024-10-14 (×3): 25 mg via ORAL
  Filled 2024-10-12 (×4): qty 1

## 2024-10-12 MED ORDER — PANTOPRAZOLE SODIUM 40 MG PO TBEC
40.0000 mg | DELAYED_RELEASE_TABLET | Freq: Two times a day (BID) | ORAL | Status: DC
  Administered 2024-10-12 – 2024-10-15 (×6): 40 mg via ORAL
  Filled 2024-10-12 (×6): qty 1

## 2024-10-12 NOTE — Plan of Care (Signed)

## 2024-10-12 NOTE — Progress Notes (Signed)
 PROGRESS NOTE    Sherri Peters  FMW:[id number] DOB: Sherri Peters 12, 1947 DOA: 10/11/2024 PCP: Johnie Perkins, PA-C  Assessment & Plan:   Principal Problem:   COPD exacerbation (HCC) Active Problems:   Hyperlipidemia   Hypertension   Depression   OSA on CPAP   Restless leg syndrome   Gastroesophageal reflux disease without esophagitis   Sepsis secondary to UTI (HCC)  Assessment and Plan: Acute hypoxic respiratory failure: likely secondary to COPD exacerbation. Found to be 85% on RA. Continue on supplemental oxygen and wean as tolerated. Continue on IV steroids, IV rocephin , doxy, bronchodilators & encourage incentive spirometry  COPD exacerbation: continue on IV abxs, IV steroids, bronchodilators & encourage incentive spirometry. Influenza, COVID19, RSV are all neg   Possible UTI: no urine cx was done. Continue on IV rocephin  to complete the course. Unlikely urine cx would grow anything now after pt starting abxs   OSA: will use home CPAP  HLD: not taking a statin as per med rec  HTN: continue on irbesartan  while inpatient. Holding home telmisartan -HCTZ  Hypothyroidism: continue on home dose of levothyroxine     RLS: not on any meds for this as per med rec    GERD: continue on PPI    Depression: severity unknown. Continue on home dose of citalopram    Obesity: BMI 32.7. Would benefit from weight loss       DVT prophylaxis: lovenox   Code Status: DNR Family Communication:  Disposition Plan: depends on PT/OT recs  Level of care: Telemetry  Status is: Inpatient Remains inpatient appropriate because: severity of illness    Consultants:    Procedures:   Antimicrobials: rocpehin, doxy    Subjective: Pt c/o shortness of breath & not being able to taste or smell.   Objective: Vitals:   10/11/24 1413 10/11/24 2045 10/12/24 0347 10/12/24 0908  BP: (!) 157/82 (!) 149/71 139/69 (!) 166/82  Pulse: 94 84 80 80  Resp: 16 16 18 18   Temp: 97.8 F (36.6 C) 99.1 F (37.3 C)  98.1 F (36.7 C)   TempSrc: Oral  Oral   SpO2: 94% 96% 94% 93%  Weight:      Height:        Intake/Output Summary (Last 24 hours) at 10/12/2024 0946 Last data filed at 10/12/2024 9361 Gross per 24 hour  Intake 498.24 ml  Output --  Net 498.24 ml   Filed Weights   10/11/24 0623  Weight: 83.9 kg    Examination:  General exam: Appears calm and comfortable  Respiratory system: diminished breath sounds b/l  Cardiovascular system: S1 & S2 +. No rubs, gallops or clicks.  Gastrointestinal system: Abdomen is obese, soft and nontender. Normal bowel sounds heard. Central nervous system: Alert and oriented. Moves all extremities Psychiatry: Judgement and insight appear normal. Mood & affect appropriate.     Data Reviewed: I have personally reviewed following labs and imaging studies  CBC: Recent Labs  Lab 10/11/24 0635 10/11/24 0935 10/12/24 0636  WBC 7.7 6.5 5.9  NEUTROABS 5.0  --   --   HGB 12.1 11.6* 11.1*  HCT 37.6 35.9* 34.3*  MCV 98.9 98.9 99.1  PLT 210 200 197   Basic Metabolic Panel: Recent Labs  Lab 10/11/24 0635 10/11/24 0935 10/12/24 0636  NA 140  --  138  K 3.8  --  4.0  CL 103  --  102  CO2 24  --  25  GLUCOSE 111*  --  133*  BUN 13  --  14  CREATININE 1.22*  1.17* 1.11*  CALCIUM  8.9  --  9.0   GFR: Estimated Creatinine Clearance: 44.2 mL/min (A) (by C-G formula based on SCr of 1.11 mg/dL (H)). Liver Function Tests: Recent Labs  Lab 10/11/24 0635 10/12/24 0636  AST 25 34  ALT 19 18  ALKPHOS 62 54  BILITOT 0.2 0.2  PROT 6.8 6.3*  ALBUMIN 4.2 3.9   No results for input(s): LIPASE, AMYLASE in the last 168 hours. No results for input(s): AMMONIA in the last 168 hours. Coagulation Profile: Recent Labs  Lab 10/11/24 0635 10/12/24 0636  INR 1.0 1.0   Cardiac Enzymes: No results for input(s): CKTOTAL, CKMB, CKMBINDEX, TROPONINI in the last 168 hours. BNP (last 3 results) No results for input(s): PROBNP in the last 8760  hours. HbA1C: No results for input(s): HGBA1C in the last 72 hours. CBG: No results for input(s): GLUCAP in the last 168 hours. Lipid Profile: No results for input(s): CHOL, HDL, LDLCALC, TRIG, CHOLHDL, LDLDIRECT in the last 72 hours. Thyroid Function Tests: Recent Labs    10/11/24 1526  TSH 1.930   Anemia Panel: No results for input(s): VITAMINB12, FOLATE, FERRITIN, TIBC, IRON, RETICCTPCT in the last 72 hours. Sepsis Labs: Recent Labs  Lab 10/11/24 9364  LATICACIDVEN 1.1    Recent Results (from the past 240 hours)  Resp panel by RT-PCR (RSV, Flu A&B, Covid) Anterior Nasal Swab     Status: None   Collection Time: 10/11/24  6:35 AM   Specimen: Anterior Nasal Swab  Result Value Ref Range Status   SARS Coronavirus 2 by RT PCR NEGATIVE NEGATIVE Final    Comment: (NOTE) SARS-CoV-2 target nucleic acids are NOT DETECTED.  The SARS-CoV-2 RNA is generally detectable in upper respiratory specimens during the acute phase of infection. The lowest concentration of SARS-CoV-2 viral copies this assay can detect is 138 copies/mL. A negative result does not preclude SARS-Cov-2 infection and should not be used as the sole basis for treatment or other patient management decisions. A negative result Bink occur with  improper specimen collection/handling, submission of specimen other than nasopharyngeal swab, presence of viral mutation(s) within the areas targeted by this assay, and inadequate number of viral copies(<138 copies/mL). A negative result must be combined with clinical observations, patient history, and epidemiological information. The expected result is Negative.  Fact Sheet for Patients:  bloggercourse.com  Fact Sheet for Healthcare Providers:  seriousbroker.it  This test is no t yet approved or cleared by the [place]  FDA and  has been authorized for detection and/or diagnosis of SARS-CoV-2  by FDA under an Emergency Use Authorization (EUA). This EUA will remain  in effect (meaning this test can be used) for the duration of the COVID-19 declaration under Section 564(b)(1) of the Act, 21 U.S.C.section 360bbb-3(b)(1), unless the authorization is terminated  or revoked sooner.       Influenza A by PCR NEGATIVE NEGATIVE Final   Influenza B by PCR NEGATIVE NEGATIVE Final    Comment: (NOTE) The Xpert Xpress SARS-CoV-2/FLU/RSV plus assay is intended as an aid in the diagnosis of influenza from Nasopharyngeal swab specimens and should not be used as a sole basis for treatment. Nasal washings and aspirates are unacceptable for Xpert Xpress SARS-CoV-2/FLU/RSV testing.  Fact Sheet for Patients: bloggercourse.com  Fact Sheet for Healthcare Providers: seriousbroker.it  This test is not yet approved or cleared by the [place]  FDA and has been authorized for detection and/or diagnosis of SARS-CoV-2 by FDA under an Emergency Use Authorization (EUA). This EUA will remain  in effect (meaning this test can be used) for the duration of the COVID-19 declaration under Section 564(b)(1) of the Act, 21 U.S.C. section 360bbb-3(b)(1), unless the authorization is terminated or revoked.     Resp Syncytial Virus by PCR NEGATIVE NEGATIVE Final    Comment: (NOTE) Fact Sheet for Patients: bloggercourse.com  Fact Sheet for Healthcare Providers: seriousbroker.it  This test is not yet approved or cleared by the [place]  FDA and has been authorized for detection and/or diagnosis of SARS-CoV-2 by FDA under an Emergency Use Authorization (EUA). This EUA will remain in effect (meaning this test can be used) for the duration of the COVID-19 declaration under Section 564(b)(1) of the Act, 21 U.S.C. section 360bbb-3(b)(1), unless the authorization is terminated or revoked.  Performed at  [facility], [address] [place], [place] [address]   Blood Culture (routine x 2)     Status: None (Preliminary result)   Collection Time: 10/11/24  6:35 AM   Specimen: BLOOD  Result Value Ref Range Status   Specimen Description BLOOD BLOOD RIGHT HAND  Final   Special Requests   Final    BOTTLES DRAWN AEROBIC AND ANAEROBIC Blood Culture adequate volume   Culture   Final    NO GROWTH 1 DAY Performed at [facility], [address] [place], [place] [address]    Report Status PENDING  Incomplete  Blood Culture (routine x 2)     Status: None (Preliminary result)   Collection Time: 10/11/24  6:35 AM   Specimen: BLOOD  Result Value Ref Range Status   Specimen Description BLOOD RIGHT ANTECUBITAL  Final   Special Requests   Final    BOTTLES DRAWN AEROBIC AND ANAEROBIC Blood Culture adequate volume   Culture   Final    NO GROWTH 1 DAY Performed at [facility], [address], [place], [place] [address]    Report Status PENDING  Incomplete  Expectorated Sputum Assessment w Gram Stain, Rflx to Resp Cult     Status: None   Collection Time: 10/11/24  9:35 AM   Specimen: Sputum  Result Value Ref Range Status   Specimen Description SPUTUM  Final   Special Requests NONE  Final   Sputum evaluation   Final    THIS SPECIMEN IS ACCEPTABLE FOR SPUTUM CULTURE Performed at [facility], [address], [place], [place] [address]    Report Status 10/11/2024 FINAL  Final  Culture, Respiratory w Gram Stain     Status: None (Preliminary result)   Collection Time: 10/11/24  9:35 AM   Specimen: SPU  Result Value Ref Range Status   Specimen Description   Final    SPUTUM Performed at [facility], [address], [place], [place] [address]    Special Requests   Final    NONE Reflexed from [phone] Performed at [facility], [address] [place], [place] [address]    Gram Stain   Final    NO WBC SEEN FEW GRAM POSITIVE  COCCI Performed at [facility], [address], [place], [place] [address]    Culture PENDING  Incomplete   Report Status PENDING  Incomplete         Radiology Studies: CT Angio Chest PE W/Cm &/Or Wo Cm Result Date: 10/11/2024 EXAM: CTA of the Chest with contrast for PE 10/11/2024 08:51:43 AM TECHNIQUE: CTA of the chest was performed after the administration of 75 mL of iohexol  (OMNIPAQUE ) 350 MG/ML injection. Multiplanar reformatted  images are provided for review. MIP images are provided for review. Automated exposure control, iterative reconstruction, and/or weight based adjustment of the mA/kV was utilized to reduce the radiation dose to as low as reasonably achievable. COMPARISON: None available. CLINICAL HISTORY: Pulmonary embolism (PE) suspected, low to intermediate prob, positive D-dimer. FINDINGS: PULMONARY ARTERIES: Pulmonary arteries are adequately opacified for evaluation. No pulmonary embolism. Main pulmonary artery is normal in caliber. MEDIASTINUM: Coronary artery calcifications. Extensive mitral valve calcifications. Trace pericardial effusion. Aortic atherosclerosis. There is no acute abnormality of the thoracic aorta. LYMPH NODES: No mediastinal or axillary lymphadenopathy. Prominent bilateral hilar lymph nodes are identified including a 1.2 cm left hilar lymph node, image 169/6, and a 1.1 cm right hilar lymph node, image 168/6. LUNGS AND PLEURA: The trachea appears patent. Filling defects within the right middle lobe bronchi identified. Asymmetric increased peribronchovascular soft tissue at the origin of the right middle lobe bronchus is identified. The increased soft tissue within this area measures approximately 2.3 x 1.8 cm, image 73/4 and coronal image 74/7. Scattered endoluminal filling defects of the proximal right middle lobe bronchus possibly reflecting aspirated debris or mucous plugging. Partial atelectasis of the right middle lobe is noted. Patchy area of geographic  ground glass attenuation is noted within the posterobasal right upper lobe compatible with a nonspecific pneumonitis, axial image 56/5. Scattered areas of tree-in-bud nodularity noted within both lungs compatible with an inflammatory or infectious bronchiolitis. Focal area of subpleural focal scarring or atelectasis within the medial left apex is noted, axial image 20/5. No significant pleural effusion identified. No pneumothorax. UPPER ABDOMEN: Limited images of the upper abdomen demonstrate status post cholecystectomy. Bosniak class 1 cyst off the posterior cortex of the left kidney measures 2.1 cm. SOFT TISSUES AND BONES: No acute soft tissue abnormality. Multilevel degenerative disc disease and mild scoliosis within the thoracic spine. IMPRESSION: 1. No pulmonary embolism. 2. Partial right middle lobe atelectasis with asymmetric peribronchovascular soft tissue and endoluminal filling defects in the proximal right middle lobe bronchus, favored aspirated debris or mucous plugging. Follow-up imaging is advised to ensure resolution of the increased peribronchovascular soft tissue about the proximal right middle lobe bronchus. Evaluation to exclude underlying malignancy. Specifically, repeat CT of the chest with contrast material in 1 month is advised following resolution of patient's symptoms. 3. Geographic ground-glass attenuation in the posterobasal right upper lobe, compatible with nonspecific pneumonitis. 4. Scattered tree-in-bud nodularity in both lungs, compatible with inflammatory or infectious bronchiolitis. 5. Prominent bilateral hilar lymph nodes. Favor reactive adenopathy in the setting of inflammation or infection. 6. Aortic atherosclerosis with coronary artery calcifications. Electronically signed by: Waddell Calk MD 10/11/2024 09:48 AM EST RP Workstation: [id number]   US  Venous Img Lower Bilateral Result Date: 10/11/2024 EXAM: ULTRASOUND DUPLEX OF THE BILATERAL LOWER EXTREMITY VEINS TECHNIQUE:  Duplex ultrasound using B-mode/gray scaled imaging and Doppler spectral analysis and color flow was obtained of the deep venous structures of the bilateral lower extremity. COMPARISON: None available. CLINICAL HISTORY: Leg swelling Leg swelling FINDINGS: The common femoral vein, femoral vein, popliteal vein, and posterior tibial vein of the _laterality_ lower extremity demonstrate normal compressibility with normal color flow and spectral analysis. IMPRESSION: 1. No evidence of DVT. Electronically signed by: Evalene Coho MD 10/11/2024 07:59 AM EST RP Workstation: [id number]   DG Chest 2 View Result Date: 10/11/2024 EXAM: 2 VIEW(S) XRAY OF THE CHEST 10/11/2024 06:43:30 AM COMPARISON: 10/08/2023 CLINICAL HISTORY: SOB FINDINGS: LUNGS AND PLEURA: Hyperinflation. Chronic interstitial prominence, unchanged. No pleural effusion. No pneumothorax. HEART AND MEDIASTINUM:  No acute abnormality of the cardiac and mediastinal silhouettes. BONES AND SOFT TISSUES: No acute osseous abnormality. IMPRESSION: 1. No acute findings. 2. Hyperinflation and chronic interstitial prominence, unchanged. Electronically signed by: Waddell Calk MD 10/11/2024 06:47 AM EST RP Workstation: [id number]        Scheduled Meds:  citalopram   20 mg Oral Daily   enoxaparin  (LOVENOX ) injection  40 mg Subcutaneous Q24H   irbesartan   75 mg Oral Daily   levothyroxine   75 mcg Oral Q0600   pantoprazole  (PROTONIX ) IV  40 mg Intravenous Q12H   predniSONE   40 mg Oral Q breakfast   pregabalin   100 mg Oral TID   Continuous Infusions:  cefTRIAXone  (ROCEPHIN )  IV 1 g (10/12/24 0919)   doxycycline  (VIBRAMYCIN ) IV 100 mg (10/11/24 2112)     LOS: 1 day      Anthony CHRISTELLA Pouch, MD [facility] Pager [phone]  If 7PM-7AM, please contact night-coverage [email or web address] 10/12/2024, 9:46 AM

## 2024-10-13 ENCOUNTER — Inpatient Hospital Stay

## 2024-10-13 LAB — CULTURE, RESPIRATORY W GRAM STAIN
Culture: NORMAL
Gram Stain: NONE SEEN

## 2024-10-13 MED ORDER — BUSPIRONE HCL 10 MG PO TABS
5.0000 mg | ORAL_TABLET | Freq: Two times a day (BID) | ORAL | Status: DC
  Administered 2024-10-13 – 2024-10-15 (×5): 5 mg via ORAL
  Filled 2024-10-13 (×4): qty 1

## 2024-10-13 MED ORDER — HYDROXYZINE HCL 25 MG PO TABS
25.0000 mg | ORAL_TABLET | Freq: Three times a day (TID) | ORAL | Status: DC | PRN
  Administered 2024-10-13: 25 mg via ORAL
  Filled 2024-10-13: qty 1

## 2024-10-13 MED ORDER — GUAIFENESIN ER 600 MG PO TB12
1200.0000 mg | ORAL_TABLET | Freq: Two times a day (BID) | ORAL | Status: DC
  Administered 2024-10-13 – 2024-10-15 (×4): 1200 mg via ORAL
  Filled 2024-10-13 (×4): qty 2

## 2024-10-13 NOTE — Plan of Care (Signed)
   Problem: Education: Goal: Knowledge of General Education information will improve Description: Including pain rating scale, medication(s)/side effects and non-pharmacologic comfort measures Outcome: Progressing   Problem: Health Behavior/Discharge Planning: Goal: Ability to manage health-related needs will improve Outcome: Progressing   Problem: Activity: Goal: Risk for activity intolerance will decrease Outcome: Progressing

## 2024-10-13 NOTE — Progress Notes (Signed)
 PROGRESS NOTE    Sherri Peters  FMW:[id number] DOB: April 09, 1946 DOA: 10/11/2024 PCP: Johnie Perkins, PA-C  Assessment & Plan:   Principal Problem:   COPD exacerbation (HCC) Active Problems:   Hyperlipidemia   Hypertension   Depression   OSA on CPAP   Restless leg syndrome   Gastroesophageal reflux disease without esophagitis   Sepsis secondary to UTI (HCC)  Assessment and Plan: Acute hypoxic respiratory failure: likely secondary to COPD exacerbation. Found to be 85% on RA.  Continue on supplemental oxygen and wean as tolerated. Worsening shortness of breath as per pt. CXR ordered. Continue on IV steroids, doxy, rocephin , bronchodilators & encourage incentive spirometry   COPD exacerbation: worsening shortness of breath today. CXR ordered. Continue on IV rocephin , doxy, steroid, bronchodilators & encourage incentive spirometry. Influenza, COVID19, RSV are all neg   Possible UTI: no urine cx was done. Continue on IV rocephin  to complete the course. Unlikely urine cx would grow anything now after pt starting abxs   OSA: CPAP qhs  HLD: not taking a statin as per med rec   HTN: continue on irbesartan . Holding home telmisartan -HCTZ  Hypothyroidism: continue on home dose of levothyroxine      RLS: not on any meds for this as per med rec    GERD: continue on PPI   Depression: severity unknown. Continue on home dose of cialopram   Obesity: BMI 32.7. Would benefit from weight loss       DVT prophylaxis: lovenox   Code Status: DNR Family Communication:  Disposition Plan: likely d/c back home  Level of care: Telemetry  Status is: Inpatient Remains inpatient appropriate because: severity of illness, worsening shortness of breath today     Consultants:    Procedures:   Antimicrobials: rocpehin, doxy    Subjective: Pt c/o worsening shortness of breath   Objective: Vitals:   10/12/24 0908 10/12/24 2034 10/13/24 0341 10/13/24 0754  BP: (!) 166/82 (!) 163/97 (!)  152/78 (!) 155/73  Pulse: 80 79 68 72  Resp: 18 18 16 16   Temp:  98.3 F (36.8 C) 97.6 F (36.4 C) 97.8 F (36.6 C)  TempSrc:  Oral Oral Oral  SpO2: 93% 92% 90% 92%  Weight:      Height:        Intake/Output Summary (Last 24 hours) at 10/13/2024 0950 Last data filed at 10/12/2024 1700 Gross per 24 hour  Intake 590 ml  Output --  Net 590 ml   Filed Weights   10/11/24 0623  Weight: 83.9 kg    Examination:  General exam: Appears uncomfortable  Respiratory system: decreased breath sounds b/l Cardiovascular system: S1/S2+. No rubs or clicks  Gastrointestinal system: Abd is soft, NT, obese & hypoactive bowel sounds  Central nervous system: alert & oriented. Moves all extremities  Psychiatry: judgement and insight appears at baseline. Flat mood and affect    Data Reviewed: I have personally reviewed following labs and imaging studies  CBC: Recent Labs  Lab 10/11/24 0635 10/11/24 0935 10/12/24 0636  WBC 7.7 6.5 5.9  NEUTROABS 5.0  --   --   HGB 12.1 11.6* 11.1*  HCT 37.6 35.9* 34.3*  MCV 98.9 98.9 99.1  PLT 210 200 197   Basic Metabolic Panel: Recent Labs  Lab 10/11/24 0635 10/11/24 0935 10/12/24 0636  NA 140  --  138  K 3.8  --  4.0  CL 103  --  102  CO2 24  --  25  GLUCOSE 111*  --  133*  BUN  13  --  14  CREATININE 1.22* 1.17* 1.11*  CALCIUM  8.9  --  9.0   GFR: Estimated Creatinine Clearance: 44.2 mL/min (A) (by C-G formula based on SCr of 1.11 mg/dL (H)). Liver Function Tests: Recent Labs  Lab 10/11/24 0635 10/12/24 0636  AST 25 34  ALT 19 18  ALKPHOS 62 54  BILITOT 0.2 0.2  PROT 6.8 6.3*  ALBUMIN 4.2 3.9   No results for input(s): LIPASE, AMYLASE in the last 168 hours. No results for input(s): AMMONIA in the last 168 hours. Coagulation Profile: Recent Labs  Lab 10/11/24 0635 10/12/24 0636  INR 1.0 1.0   Cardiac Enzymes: No results for input(s): CKTOTAL, CKMB, CKMBINDEX, TROPONINI in the last 168 hours. BNP (last 3  results) No results for input(s): PROBNP in the last 8760 hours. HbA1C: No results for input(s): HGBA1C in the last 72 hours. CBG: No results for input(s): GLUCAP in the last 168 hours. Lipid Profile: No results for input(s): CHOL, HDL, LDLCALC, TRIG, CHOLHDL, LDLDIRECT in the last 72 hours. Thyroid Function Tests: Recent Labs    10/11/24 1526  TSH 1.930   Anemia Panel: No results for input(s): VITAMINB12, FOLATE, FERRITIN, TIBC, IRON, RETICCTPCT in the last 72 hours. Sepsis Labs: Recent Labs  Lab 10/11/24 9364  LATICACIDVEN 1.1    Recent Results (from the past 240 hours)  Resp panel by RT-PCR (RSV, Flu A&B, Covid) Anterior Nasal Swab     Status: None   Collection Time: 10/11/24  6:35 AM   Specimen: Anterior Nasal Swab  Result Value Ref Range Status   SARS Coronavirus 2 by RT PCR NEGATIVE NEGATIVE Final    Comment: (NOTE) SARS-CoV-2 target nucleic acids are NOT DETECTED.  The SARS-CoV-2 RNA is generally detectable in upper respiratory specimens during the acute phase of infection. The lowest concentration of SARS-CoV-2 viral copies this assay can detect is 138 copies/mL. A negative result does not preclude SARS-Cov-2 infection and should not be used as the sole basis for treatment or other patient management decisions. A negative result Ord occur with  improper specimen collection/handling, submission of specimen other than nasopharyngeal swab, presence of viral mutation(s) within the areas targeted by this assay, and inadequate number of viral copies(<138 copies/mL). A negative result must be combined with clinical observations, patient history, and epidemiological information. The expected result is Negative.  Fact Sheet for Patients:  bloggercourse.com  Fact Sheet for Healthcare Providers:  seriousbroker.it  This test is no t yet approved or cleared by the [place]  FDA and  has  been authorized for detection and/or diagnosis of SARS-CoV-2 by FDA under an Emergency Use Authorization (EUA). This EUA will remain  in effect (meaning this test can be used) for the duration of the COVID-19 declaration under Section 564(b)(1) of the Act, 21 U.S.C.section 360bbb-3(b)(1), unless the authorization is terminated  or revoked sooner.       Influenza A by PCR NEGATIVE NEGATIVE Final   Influenza B by PCR NEGATIVE NEGATIVE Final    Comment: (NOTE) The Xpert Xpress SARS-CoV-2/FLU/RSV plus assay is intended as an aid in the diagnosis of influenza from Nasopharyngeal swab specimens and should not be used as a sole basis for treatment. Nasal washings and aspirates are unacceptable for Xpert Xpress SARS-CoV-2/FLU/RSV testing.  Fact Sheet for Patients: bloggercourse.com  Fact Sheet for Healthcare Providers: seriousbroker.it  This test is not yet approved or cleared by the [place]  FDA and has been authorized for detection and/or diagnosis of SARS-CoV-2 by FDA under an  Emergency Use Authorization (EUA). This EUA will remain in effect (meaning this test can be used) for the duration of the COVID-19 declaration under Section 564(b)(1) of the Act, 21 U.S.C. section 360bbb-3(b)(1), unless the authorization is terminated or revoked.     Resp Syncytial Virus by PCR NEGATIVE NEGATIVE Final    Comment: (NOTE) Fact Sheet for Patients: bloggercourse.com  Fact Sheet for Healthcare Providers: seriousbroker.it  This test is not yet approved or cleared by the [place]  FDA and has been authorized for detection and/or diagnosis of SARS-CoV-2 by FDA under an Emergency Use Authorization (EUA). This EUA will remain in effect (meaning this test can be used) for the duration of the COVID-19 declaration under Section 564(b)(1) of the Act, 21 U.S.C. section 360bbb-3(b)(1), unless the  authorization is terminated or revoked.  Performed at [facility], [address] [place], [place] [address]   Blood Culture (routine x 2)     Status: None (Preliminary result)   Collection Time: 10/11/24  6:35 AM   Specimen: BLOOD  Result Value Ref Range Status   Specimen Description BLOOD BLOOD RIGHT HAND  Final   Special Requests   Final    BOTTLES DRAWN AEROBIC AND ANAEROBIC Blood Culture adequate volume   Culture   Final    NO GROWTH 2 DAYS Performed at [facility], [address] [place], [place] [address]    Report Status PENDING  Incomplete  Blood Culture (routine x 2)     Status: None (Preliminary result)   Collection Time: 10/11/24  6:35 AM   Specimen: BLOOD  Result Value Ref Range Status   Specimen Description BLOOD RIGHT ANTECUBITAL  Final   Special Requests   Final    BOTTLES DRAWN AEROBIC AND ANAEROBIC Blood Culture adequate volume   Culture   Final    NO GROWTH 2 DAYS Performed at [facility], [address] [place], [place] [address]    Report Status PENDING  Incomplete  Expectorated Sputum Assessment w Gram Stain, Rflx to Resp Cult     Status: None   Collection Time: 10/11/24  9:35 AM   Specimen: Sputum  Result Value Ref Range Status   Specimen Description SPUTUM  Final   Special Requests NONE  Final   Sputum evaluation   Final    THIS SPECIMEN IS ACCEPTABLE FOR SPUTUM CULTURE Performed at [facility], [address] [place], [place] [address]    Report Status 10/11/2024 FINAL  Final  Culture, Respiratory w Gram Stain     Status: None (Preliminary result)   Collection Time: 10/11/24  9:35 AM   Specimen: SPU  Result Value Ref Range Status   Specimen Description   Final    SPUTUM Performed at [facility], [address], [place], [place] [address]    Special Requests   Final    NONE Reflexed from [id number] Performed at [facility], [address] [place], [place] [address]    Gram  Stain NO WBC SEEN FEW GRAM POSITIVE COCCI   Final   Culture   Final    CULTURE REINCUBATED FOR BETTER GROWTH Performed at [facility], [address], [place], [place] [address]    Report Status PENDING  Incomplete         Radiology Studies: No results found.       Scheduled Meds:  citalopram   20 mg Oral Daily   doxylamine  (Sleep)  25 mg Oral QHS   enoxaparin  (LOVENOX )  injection  40 mg Subcutaneous Q24H   guaiFENesin   600 mg Oral BID   irbesartan   75 mg Oral Daily   levothyroxine   75 mcg Oral Q0600   methylPREDNISolone  (SOLU-MEDROL ) injection  40 mg Intravenous Q12H   pantoprazole   40 mg Oral BID   pregabalin   100 mg Oral TID   Continuous Infusions:  cefTRIAXone  (ROCEPHIN )  IV 1 g (10/13/24 0746)   doxycycline  (VIBRAMYCIN ) IV 100 mg (10/13/24 0912)     LOS: 2 days      Anthony CHRISTELLA Pouch, MD [facility] Pager [phone]  If 7PM-7AM, please contact night-coverage [email or web address] 10/13/2024, 9:50 AM

## 2024-10-14 LAB — BASIC METABOLIC PANEL WITH GFR
Anion gap: 12 (ref 5–15)
BUN: 31 mg/dL — ABNORMAL HIGH (ref 8–23)
CO2: 26 mmol/L (ref 22–32)
Calcium: 8.8 mg/dL — ABNORMAL LOW (ref 8.9–10.3)
Chloride: 104 mmol/L (ref 98–111)
Creatinine, Ser: 1.27 mg/dL — ABNORMAL HIGH (ref 0.44–1.00)
GFR, Estimated: 44 mL/min — ABNORMAL LOW (ref >60)
Glucose, Bld: 95 mg/dL (ref 70–99)
Potassium: 4 mmol/L (ref 3.5–5.1)
Sodium: 142 mmol/L (ref 135–145)

## 2024-10-14 LAB — PROCALCITONIN: Procalcitonin: <0.1 ng/mL

## 2024-10-14 NOTE — Care Management Important Message (Signed)
 Important Message  Patient Details  Name: Sherri Peters MRN: [id number] Date of Birth: [birth year]   Important Message Given:  Yes - Medicare IM     Sherri Peters 10/14/2024, 5:07 PM

## 2024-10-14 NOTE — Progress Notes (Addendum)
 PROGRESS NOTE    Sherri Peters  FMW:[id number] DOB: August 21, 1946 DOA: 10/11/2024 PCP: Johnie Perkins, PA-C  Assessment & Plan:   Principal Problem:   COPD exacerbation (HCC) Active Problems:   Hyperlipidemia   Hypertension   Depression   OSA on CPAP   Restless leg syndrome   Gastroesophageal reflux disease without esophagitis   Sepsis secondary to UTI (HCC)  Assessment and Plan: Acute hypoxic respiratory failure: likely secondary to COPD exacerbation. Found to be 85% on RA.  Continue on supplemental oxygen and wean as tolerated, likely will have to d/c home w. Oxygen. Continue on IV steroids, doxy, rocephin , bronchodilators & encourage incentive spirometry. Will need repeat CT chest outpatient in 1 month to confirm resolution  COPD exacerbation: shortness with exertion. CXR  show no acute abnormalities. Continue on IV steroids, bronchodilators & encourage incentive spirometry. Influenza, COVID19, RSV are all neg   Possible UTI: no urine cx was done. Continue on IV rocephin  to complete the course. Unlikely urine cx would grow anything now after pt starting abxs   OSA: CPAP at bedtime   HLD: not taking a statin as per med rec   HTN: continue on irbesartan . Holding home telmisartan -HCTZ  Hypothyroidism: continue on home dose of levothyroxine    RLS: not on any meds for this as per med rec    GERD: continue on PPI    Depression: severity unknown. Continue on home dose of citalopram   Obesity: BMI 32.7. Would benefit from weight loss       DVT prophylaxis: lovenox   Code Status: DNR Family Communication:  Disposition Plan: likely d/c back home  Level of care: Telemetry  Status is: Inpatient Remains inpatient appropriate because: severity of illness, worsening shortness of breath again today     Consultants:    Procedures:   Antimicrobials: rocpehin, doxy    Subjective: Pt c/o shortness of breath w/ exertion   Objective: Vitals:   10/13/24 1000 10/13/24 1607  10/13/24 2031 10/14/24 0417  BP: 118/83 (!) 149/76 (!) 151/91 (!) 166/91  Pulse: 80 79 80 71  Resp:  15 16 16   Temp:  99 F (37.2 C) 98.2 F (36.8 C) 98 F (36.7 C)  TempSrc:   Oral   SpO2: 93% 90% 90% 95%  Weight:      Height:        Intake/Output Summary (Last 24 hours) at 10/14/2024 0656 Last data filed at 10/14/2024 0400 Gross per 24 hour  Intake 1317.8 ml  Output --  Net 1317.8 ml   Filed Weights   10/11/24 0623  Weight: 83.9 kg    Examination:  General exam: Appears comfortable Respiratory system: course breath sounds b/l Cardiovascular system: S1/S2+. No rubs or gallops Gastrointestinal system: Abd is soft, NT, obese & hypoactive bowel sounds Central nervous system: alert & oriented. Moves all extremities Psychiatry:judgement and insight appears at baseline. Flat mood and affect    Data Reviewed: I have personally reviewed following labs and imaging studies  CBC: Recent Labs  Lab 10/11/24 0635 10/11/24 0935 10/12/24 0636  WBC 7.7 6.5 5.9  NEUTROABS 5.0  --   --   HGB 12.1 11.6* 11.1*  HCT 37.6 35.9* 34.3*  MCV 98.9 98.9 99.1  PLT 210 200 197   Basic Metabolic Panel: Recent Labs  Lab 10/11/24 0635 10/11/24 0935 10/12/24 0636 10/14/24 0438  NA 140  --  138 142  K 3.8  --  4.0 4.0  CL 103  --  102 104  CO2 24  --  25 26  GLUCOSE 111*  --  133* 95  BUN 13  --  14 31*  CREATININE 1.22* 1.17* 1.11* 1.27*  CALCIUM  8.9  --  9.0 8.8*   GFR: Estimated Creatinine Clearance: 38.7 mL/min (A) (by C-G formula based on SCr of 1.27 mg/dL (H)). Liver Function Tests: Recent Labs  Lab 10/11/24 0635 10/12/24 0636  AST 25 34  ALT 19 18  ALKPHOS 62 54  BILITOT 0.2 0.2  PROT 6.8 6.3*  ALBUMIN 4.2 3.9   No results for input(s): LIPASE, AMYLASE in the last 168 hours. No results for input(s): AMMONIA in the last 168 hours. Coagulation Profile: Recent Labs  Lab 10/11/24 0635 10/12/24 0636  INR 1.0 1.0   Cardiac Enzymes: No results for  input(s): CKTOTAL, CKMB, CKMBINDEX, TROPONINI in the last 168 hours. BNP (last 3 results) No results for input(s): PROBNP in the last 8760 hours. HbA1C: No results for input(s): HGBA1C in the last 72 hours. CBG: No results for input(s): GLUCAP in the last 168 hours. Lipid Profile: No results for input(s): CHOL, HDL, LDLCALC, TRIG, CHOLHDL, LDLDIRECT in the last 72 hours. Thyroid Function Tests: Recent Labs    10/11/24 1526  TSH 1.930   Anemia Panel: No results for input(s): VITAMINB12, FOLATE, FERRITIN, TIBC, IRON, RETICCTPCT in the last 72 hours. Sepsis Labs: Recent Labs  Lab 10/11/24 0635 10/14/24 0438  PROCALCITON  --  <0.10  LATICACIDVEN 1.1  --     Recent Results (from the past 240 hours)  Resp panel by RT-PCR (RSV, Flu A&B, Covid) Anterior Nasal Swab     Status: None   Collection Time: 10/11/24  6:35 AM   Specimen: Anterior Nasal Swab  Result Value Ref Range Status   SARS Coronavirus 2 by RT PCR NEGATIVE NEGATIVE Final    Comment: (NOTE) SARS-CoV-2 target nucleic acids are NOT DETECTED.  The SARS-CoV-2 RNA is generally detectable in upper respiratory specimens during the acute phase of infection. The lowest concentration of SARS-CoV-2 viral copies this assay can detect is 138 copies/mL. A negative result does not preclude SARS-Cov-2 infection and should not be used as the sole basis for treatment or other patient management decisions. A negative result Proano occur with  improper specimen collection/handling, submission of specimen other than nasopharyngeal swab, presence of viral mutation(s) within the areas targeted by this assay, and inadequate number of viral copies(<138 copies/mL). A negative result must be combined with clinical observations, patient history, and epidemiological information. The expected result is Negative.  Fact Sheet for Patients:  bloggercourse.com  Fact Sheet for Healthcare  Providers:  seriousbroker.it  This test is no t yet approved or cleared by the [place]  FDA and  has been authorized for detection and/or diagnosis of SARS-CoV-2 by FDA under an Emergency Use Authorization (EUA). This EUA will remain  in effect (meaning this test can be used) for the duration of the COVID-19 declaration under Section 564(b)(1) of the Act, 21 U.S.C.section 360bbb-3(b)(1), unless the authorization is terminated  or revoked sooner.       Influenza A by PCR NEGATIVE NEGATIVE Final   Influenza B by PCR NEGATIVE NEGATIVE Final    Comment: (NOTE) The Xpert Xpress SARS-CoV-2/FLU/RSV plus assay is intended as an aid in the diagnosis of influenza from Nasopharyngeal swab specimens and should not be used as a sole basis for treatment. Nasal washings and aspirates are unacceptable for Xpert Xpress SARS-CoV-2/FLU/RSV testing.  Fact Sheet for Patients: bloggercourse.com  Fact Sheet for Healthcare Providers: seriousbroker.it  This  test is not yet approved or cleared by the [place]  FDA and has been authorized for detection and/or diagnosis of SARS-CoV-2 by FDA under an Emergency Use Authorization (EUA). This EUA will remain in effect (meaning this test can be used) for the duration of the COVID-19 declaration under Section 564(b)(1) of the Act, 21 U.S.C. section 360bbb-3(b)(1), unless the authorization is terminated or revoked.     Resp Syncytial Virus by PCR NEGATIVE NEGATIVE Final    Comment: (NOTE) Fact Sheet for Patients: bloggercourse.com  Fact Sheet for Healthcare Providers: seriousbroker.it  This test is not yet approved or cleared by the [place]  FDA and has been authorized for detection and/or diagnosis of SARS-CoV-2 by FDA under an Emergency Use Authorization (EUA). This EUA will remain in effect (meaning this test can be  used) for the duration of the COVID-19 declaration under Section 564(b)(1) of the Act, 21 U.S.C. section 360bbb-3(b)(1), unless the authorization is terminated or revoked.  Performed at [facility], [address] [place], [place] [address]   Blood Culture (routine x 2)     Status: None (Preliminary result)   Collection Time: 10/11/24  6:35 AM   Specimen: BLOOD  Result Value Ref Range Status   Specimen Description BLOOD BLOOD RIGHT HAND  Final   Special Requests   Final    BOTTLES DRAWN AEROBIC AND ANAEROBIC Blood Culture adequate volume   Culture   Final    NO GROWTH 2 DAYS Performed at [facility], [address], [place], [place] [address]    Report Status PENDING  Incomplete  Blood Culture (routine x 2)     Status: None (Preliminary result)   Collection Time: 10/11/24  6:35 AM   Specimen: BLOOD  Result Value Ref Range Status   Specimen Description BLOOD RIGHT ANTECUBITAL  Final   Special Requests   Final    BOTTLES DRAWN AEROBIC AND ANAEROBIC Blood Culture adequate volume   Culture   Final    NO GROWTH 2 DAYS Performed at [facility], [address] [place], [place] [address]    Report Status PENDING  Incomplete  Expectorated Sputum Assessment w Gram Stain, Rflx to Resp Cult     Status: None   Collection Time: 10/11/24  9:35 AM   Specimen: Sputum  Result Value Ref Range Status   Specimen Description SPUTUM  Final   Special Requests NONE  Final   Sputum evaluation   Final    THIS SPECIMEN IS ACCEPTABLE FOR SPUTUM CULTURE Performed at [facility], [address] [place], [place] [address]    Report Status 10/11/2024 FINAL  Final  Culture, Respiratory w Gram Stain     Status: None   Collection Time: 10/11/24  9:35 AM   Specimen: SPU  Result Value Ref Range Status   Specimen Description   Final    SPUTUM Performed at [facility], [address], [place], [place] [address]    Special Requests   Final    NONE  Reflexed from [id number] Performed at [facility], [address] [place], [place] [address]    Gram Stain NO WBC SEEN FEW GRAM POSITIVE COCCI   Final   Culture   Final    ABUNDANT Normal respiratory flora-no Staph aureus or Pseudomonas seen Performed at [facility], [address], [place], [place] [address]    Report Status 10/13/2024 FINAL  Final         Radiology Studies: DG Chest  Port 1 View Result Date: 10/13/2024 CLINICAL DATA:  Dyspnea EXAM: PORTABLE CHEST 1 VIEW COMPARISON:  Chest radiograph dated 10/11/2024 FINDINGS: Normal lung volumes. No focal consolidations. No pleural effusion or pneumothorax. Similar enlarged cardiomediastinal silhouette. No acute osseous abnormality. IMPRESSION: No active disease. Electronically Signed   By: Limin  Xu M.D.   On: 10/13/2024 17:26         Scheduled Meds:  busPIRone   5 mg Oral BID   citalopram   20 mg Oral Daily   doxylamine  (Sleep)  25 mg Oral QHS   enoxaparin  (LOVENOX ) injection  40 mg Subcutaneous Q24H   guaiFENesin   1,200 mg Oral BID   irbesartan   75 mg Oral Daily   levothyroxine   75 mcg Oral Q0600   methylPREDNISolone  (SOLU-MEDROL ) injection  40 mg Intravenous Q12H   pantoprazole   40 mg Oral BID   pregabalin   100 mg Oral TID   Continuous Infusions:  cefTRIAXone  (ROCEPHIN )  IV 1 g (10/13/24 0746)   doxycycline  (VIBRAMYCIN ) IV Stopped (10/14/24 0000)     LOS: 3 days      Anthony CHRISTELLA Pouch, MD [facility] Pager [phone]  If 7PM-7AM, please contact night-coverage [email or web address] 10/14/2024, 6:56 AM

## 2024-10-14 NOTE — Evaluation (Signed)
 Physical Therapy Evaluation Patient Details Name: Sherri Peters MRN: [id number] DOB: Oct 28, 1946 Today's Date: 10/14/2024  History of Present Illness  Pt is a 78 y/o F admitted on 10/11/24 after presenting with c/o cough, congestion, worsening SOB. Pt is being treated for COPD exacerbation. PMH: COPD not on home O2, HTN, HLD, hypothyroidism, heart failure with preserved EF, CAD, depression, CKD 2, depression, fibromyalgia, umbilical hernia, L TKA  Clinical Impression  Pt seen for PT evaluation with pt agreeable to tx. Pt reports prior to admission she was living alone in 1 level home with 3-4 steps & B rails to enter, ambulatory with RW & no falls. On this date, pt is able to ambulate around nurses station with RW & supervision without LOB.   Attempted to wean pt to room air but SpO2 as low as 81%, pt endorsing slight SOB, able to recover with rest & max cuing re: pursed lip breathing. Pt placed back on 2L/min at end of session. Pt reports understanding of how to use incentive spirometer. Recommend ongoing PT services to progress mobility as able.      If plan is discharge home, recommend the following: Assist for transportation;Help with stairs or ramp for entrance   Can travel by private vehicle        Equipment Recommendations None recommended by PT  Recommendations for Other Services       Functional Status Assessment Patient has had a recent decline in their functional status and demonstrates the ability to make significant improvements in function in a reasonable and predictable amount of time.     Precautions / Restrictions Precautions Precautions: None Precaution/Restrictions Comments: monitor O2 Restrictions Weight Bearing Restrictions Per Provider Order: No      Mobility  Bed Mobility               General bed mobility comments: not tested, pt received & left sitting EOB    Transfers Overall transfer level: Needs assistance Equipment used: Rolling walker (2  wheels) Transfers: Sit to/from Stand Sit to Stand: Supervision           General transfer comment: sit<>stand from EOB    Ambulation/Gait Ambulation/Gait assistance: Supervision Gait Distance (Feet): 150 Feet Assistive device: Rolling walker (2 wheels) Gait Pattern/deviations: Decreased stride length, Step-through pattern Gait velocity: decreased     General Gait Details: 1 standing break for increased O2 2/2 PT instruction  Stairs            Wheelchair Mobility     Tilt Bed    Modified Rankin (Stroke Patients Only)       Balance Overall balance assessment: Needs assistance Sitting-balance support: Feet supported Sitting balance-Leahy Scale: Good     Standing balance support: During functional activity, Bilateral upper extremity supported, Reliant on assistive device for balance Standing balance-Leahy Scale: Good                               Pertinent Vitals/Pain Pain Assessment Pain Assessment: No/denies pain    Home Living Family/patient expects to be discharged to:: Private residence Living Arrangements: Alone Available Help at Discharge: Family;Available PRN/intermittently Type of Home: Mobile home Home Access: Stairs to enter Entrance Stairs-Rails: Left;Right;Can reach both Entrance Stairs-Number of Steps: 3=4   Home Layout: One level Home Equipment: Agricultural [employer]ant (2 wheels)      Prior Function               Mobility Comments: Ambulatory with  RW 2/2 back issues, denies falls ADLs Comments: Uses grocery delivery     Extremity/Trunk Assessment   Upper Extremity Assessment Upper Extremity Assessment: Overall WFL for tasks assessed    Lower Extremity Assessment Lower Extremity Assessment: Generalized weakness;Overall WFL for tasks assessed       Communication   Communication Communication: No apparent difficulties    Cognition Arousal: Alert Behavior During Therapy: WFL for tasks assessed/performed   PT -  Cognitive impairments: No apparent impairments                         Following commands: Intact       Cueing       General Comments      Exercises     Assessment/Plan    PT Assessment Patient needs continued PT services  PT Problem List Cardiopulmonary status limiting activity;Decreased activity tolerance;Decreased mobility       PT Treatment Interventions Balance training;DME instruction;Gait training;Neuromuscular re-education;Stair training;Functional mobility training;Patient/family education;Therapeutic activities;Therapeutic exercise;Manual techniques    PT Goals (Current goals can be found in the Care Plan section)  Acute Rehab PT Goals Patient Stated Goal: get better PT Goal Formulation: With patient Time For Goal Achievement: 10/28/24 Potential to Achieve Goals: Good    Frequency Min 1X/week     Co-evaluation               AM-PAC PT 6 Clicks Mobility  Outcome Measure Help needed turning from your back to your side while in a flat bed without using bedrails?: None Help needed moving from lying on your back to sitting on the side of a flat bed without using bedrails?: None Help needed moving to and from a bed to a chair (including a wheelchair)?: A Little Help needed standing up from a chair using your arms (e.g., wheelchair or bedside chair)?: None Help needed to walk in hospital room?: A Little Help needed climbing 3-5 steps with a railing? : A Little 6 Click Score: 21    End of Session Equipment Utilized During Treatment: Oxygen Activity Tolerance: Patient tolerated treatment well Patient left: in bed;with call bell/phone within reach   PT Visit Diagnosis: Muscle weakness (generalized) (M62.81)    Time: 8499-8487 PT Time Calculation (min) (ACUTE ONLY): 12 min   Charges:   PT Evaluation $PT Eval Low Complexity: 1 Low   PT General Charges $$ ACUTE PT VISIT: 1 Visit         Richerd Pinal, PT, DPT 10/14/24, 3:28  PM   Richerd CHRISTELLA Pinal 10/14/2024, 3:27 PM

## 2024-10-14 NOTE — Plan of Care (Signed)
   Problem: Health Behavior/Discharge Planning: Goal: Ability to manage health-related needs will improve Outcome: Progressing

## 2024-10-14 NOTE — Plan of Care (Signed)

## 2024-10-15 DIAGNOSIS — J441 Chronic obstructive pulmonary disease with (acute) exacerbation: Secondary | ICD-10-CM | POA: Diagnosis not present

## 2024-10-15 MED ORDER — PREGABALIN 100 MG PO CAPS: 100.0000 mg | ORAL_CAPSULE | Freq: Three times a day (TID) | ORAL | 0 refills | Status: AC

## 2024-10-15 MED ORDER — SODIUM CHLORIDE 0.9 % IV SOLN
100.0000 mg | Freq: Two times a day (BID) | INTRAVENOUS | Status: DC
  Filled 2024-10-15: qty 100

## 2024-10-15 MED ORDER — ALBUTEROL SULFATE HFA 108 (90 BASE) MCG/ACT IN AERS: 2.0000 | INHALATION_SPRAY | Freq: Four times a day (QID) | RESPIRATORY_TRACT | 0 refills | Status: AC | PRN

## 2024-10-15 MED ORDER — DOXYCYCLINE HYCLATE 100 MG PO CAPS: 100.0000 mg | ORAL_CAPSULE | Freq: Every day | ORAL | 0 refills | Status: AC

## 2024-10-15 MED ORDER — PREDNISONE 10 MG PO TABS: ORAL_TABLET | ORAL | 0 refills | Status: AC

## 2024-10-15 NOTE — Progress Notes (Signed)
 No follow up appointments ordered. Patient advised to follow up with PCP in 2-3 weeks

## 2024-10-15 NOTE — Plan of Care (Signed)

## 2024-10-15 NOTE — Progress Notes (Signed)
 Mobility Specialist - Progress Note  Pre-mobility: HR 73 During mobility: HR 93 Post-mobility: HR 72   10/15/24 1121  Mobility  Activity Ambulated with assistance  Level of Assistance Standby assist, set-up cues, supervision of patient - no hands on  Assistive Device Front wheel walker  Distance Ambulated (ft) 160 ft  Activity Response Tolerated well  Mobility visit 1 Mobility  Mobility Specialist Start Time (ACUTE ONLY) 1055  Mobility Specialist Stop Time (ACUTE ONLY) 1115  Mobility Specialist Time Calculation (min) (ACUTE ONLY) 20 min   Nurse requested Mobility Specialist to perform oxygen saturation test with pt which includes removing pt from oxygen both at rest and while ambulating.  Below are the results from that testing.     Patient Saturations on Room Air at Rest = spO2 94%  Patient Saturations on Room Air while Ambulating = sp02 88% .  Rested and performed pursed lip breathing for 1 minute with sp02 at 87%.  Patient Saturations on 2 Liters of oxygen while Ambulating = sp02 92%  At end of testing pt left in room on 2 Liters of oxygen.  Reported results to nurse.   Pt amb one lap around the NS w/ sup, O2 desat ~40 ft into amb requiring 2L to recover---- min SOB during amb. Pt returned to the room, left supine with alarm set and needs within reach.  Sherri Peters Mobility Specialist 10/15/24 11:30 AM

## 2024-10-15 NOTE — Discharge Summary (Signed)
 Physician Discharge Summary  Sherri Peters FMW:[id number] DOB: 1946-07-02 DOA: 10/11/2024  PCP: Johnie Perkins, PA-C  Admit date: 10/11/2024 Discharge date: 10/15/2024  Admitted From: home Disposition:  home  Recommendations for Outpatient Follow-up:  Follow up with PCP in 1-2 weeks   Home Health: no  Equipment/Devices: 2L [place]  Discharge Condition: stable  CODE STATUS: DNR Diet recommendation: Heart Healthy  Brief/Interim Summary: HPI was taken from Dr. Roann:  Sherri Peters is a pleasant 78 y.o. female with medical history significant for COPD not on home oxygen, HTN, HLD, hypothyroidism, heart failure with preserved ejection fraction, CAD and depression who came into ED complaining of productive cough, congestion and worsening shortness of breath over the last week.  She stated that this Monday she started feeling unwell.  She started having cough which was dry in the beginning and then started having some productive sputum, yellow no blood.  She also had congestion and she started getting progressive shortness of breath over that period of time.  She denies any history of leg swelling, chest pain, fever, palpitations, dysuria, hematuria, nausea, vomiting or diarrhea.   ED Course: Upon arrival to the ED, patient is found to be hypertensive at 166/72, tachycardic at 102, respiration of 17, saturating 91% on room air.  Chest x-ray showed no pneumonia.  EKG showed sinus tachycardia at 101 bpm.  She was given multiple dose of nebulizations initially her oxygen improved later again she went down to 87% on required 4 L of nasal cannula oxygen.  Her viral panel was negative.  Urine analysis was positive.  Her bilateral lower extremity ultrasound showed no DVT.  She was given multiple dose of nebulization, IV Solu-Medrol , ceftriaxone  and doxycycline  to cover for pneumonia and UTI.  Hospitalist service was consulted for evaluation for admission for possible COPD exacerbation and pneumonia.  Discharge  Diagnoses:  Principal Problem:   COPD exacerbation (HCC) Active Problems:   Hyperlipidemia   Hypertension   Depression   OSA on CPAP   Restless leg syndrome   Gastroesophageal reflux disease without esophagitis   Sepsis secondary to UTI (HCC)  Acute hypoxic respiratory failure: likely secondary to COPD exacerbation. Found to be 85% on RA.  Continue on supplemental oxygen and unable to wean from oxygen so d/c home w/ oxygen.  Continue on steroids, bronchodilators & encourage incentive spirometry. Will complete abx course today. Will need repeat CT chest outpatient in 1 month to confirm resolution  COPD exacerbation: shortness with exertion. CXR  show no acute abnormalities. Continue on steroids, bronchodilators & encourage incentive spirometry. Influenza, COVID19, RSV are all neg   Possible UTI: no urine cx was done. Completed abx course   OSA: CPAP at bedtime   HLD: not taking a statin as per med rec   HTN: restart home dose of telmisartan -HCTZ  Hypothyroidism: continue on home dose of levothyroxine    RLS: not on any meds for this as per med rec    GERD: continue on PPI    Depression: severity unknown. Continue on home dose of citalopram   Obesity: BMI 32.7. Would benefit from weight loss   Discharge Instructions  Discharge Instructions     Diet - low sodium heart healthy   Complete by: As directed    Discharge instructions   Complete by: As directed    F/u w/ PCP in 1-2 weeks. PCP can do another oxygen desaturation test outpatient to see if you supplemental oxygen chronically or not.   Increase activity slowly   Complete by: As directed  Allergies as of 10/15/2024       Reactions   Duloxetine Diarrhea   N/V/D   Levaquin [levofloxacin] Other (See Comments)   hallucinations   Fluoxetine Other (See Comments)   Gabapentin  Swelling   Meloxicam  Swelling   Tizanidine Other (See Comments)        Medication List     TAKE these medications     acetaminophen  650 MG CR tablet Commonly known as: TYLENOL  Take 650 mg by mouth every 8 (eight) hours as needed for pain.   albuterol  108 (90 Base) MCG/ACT inhaler Commonly known as: VENTOLIN  HFA Inhale 2 puffs into the lungs every 6 (six) hours as needed for wheezing or shortness of breath.   B-complex with vitamin C tablet Take 1 tablet by mouth daily.   busPIRone  5 MG tablet Commonly known as: BUSPAR  Take 5 mg by mouth 2 (two) times daily as needed.   citalopram  20 MG tablet Commonly known as: CELEXA  Take 20 mg by mouth daily.   doxycycline  100 MG capsule Commonly known as: VIBRAMYCIN  Take 1 capsule (100 mg total) by mouth daily for 1 day.   empagliflozin 10 MG Tabs tablet Commonly known as: JARDIANCE Take 10 mg by mouth daily.   ezetimibe 10 MG tablet Commonly known as: ZETIA Take 10 mg by mouth daily.   levothyroxine  75 MCG tablet Commonly known as: SYNTHROID  Take 75 mcg by mouth daily before breakfast.   nicotine  7 mg/24hr patch Commonly known as: NICODERM CQ  - dosed in mg/24 hr Place 1 patch (7 mg total) onto the skin daily.   nicotine  polacrilex 4 MG lozenge Commonly known as: Nicotine  Mini Take 1 lozenge (4 mg total) by mouth as needed.   pantoprazole  40 MG tablet Commonly known as: PROTONIX  Take 1 tablet (40 mg total) by mouth 2 (two) times daily for 7 days.   predniSONE  10 MG tablet Commonly known as: DELTASONE  50mg  daily x 2 days, 40mg  daily x 2 days, 30mg  daily x 2 days, 20mg  daily x 2 days, 10mg  daily x 2 days then stop   pregabalin  100 MG capsule Commonly known as: LYRICA  Take 1 capsule (100 mg total) by mouth 3 (three) times daily.   telmisartan -hydrochlorothiazide  80-12.5 MG tablet Commonly known as: MICARDIS  HCT TAKE 1 TABLET BY MOUTH  DAILY   Vitamin D3 50 MCG (2000 UT) capsule Take 2,000 Units by mouth daily.               Durable Medical Equipment  (From admission, onward)           Start     Ordered   10/15/24 1223   For home use only DME oxygen  Once       Question Answer Comment  Length of Need 12 Months   Mode or (Route) Nasal cannula   Liters per Minute 2   Frequency Continuous (stationary and portable oxygen unit needed)   Oxygen conserving device Yes   Oxygen delivery system: Gas      10/15/24 1222            Allergies  Allergen Reactions   Duloxetine Diarrhea    N/V/D   Levaquin [Levofloxacin] Other (See Comments)    hallucinations   Fluoxetine Other (See Comments)   Gabapentin  Swelling   Meloxicam  Swelling   Tizanidine Other (See Comments)    Consultations:   Procedures/Studies: DG Chest Port 1 View Result Date: 10/13/2024 CLINICAL DATA:  Dyspnea EXAM: PORTABLE CHEST 1 VIEW COMPARISON:  Chest radiograph dated  10/11/2024 FINDINGS: Normal lung volumes. No focal consolidations. No pleural effusion or pneumothorax. Similar enlarged cardiomediastinal silhouette. No acute osseous abnormality. IMPRESSION: No active disease. Electronically Signed   By: Limin  Xu M.D.   On: 10/13/2024 17:26   CT Angio Chest PE W/Cm &/Or Wo Cm Result Date: 10/11/2024 EXAM: CTA of the Chest with contrast for PE 10/11/2024 08:51:43 AM TECHNIQUE: CTA of the chest was performed after the administration of 75 mL of iohexol  (OMNIPAQUE ) 350 MG/ML injection. Multiplanar reformatted images are provided for review. MIP images are provided for review. Automated exposure control, iterative reconstruction, and/or weight based adjustment of the mA/kV was utilized to reduce the radiation dose to as low as reasonably achievable. COMPARISON: None available. CLINICAL HISTORY: Pulmonary embolism (PE) suspected, low to intermediate prob, positive D-dimer. FINDINGS: PULMONARY ARTERIES: Pulmonary arteries are adequately opacified for evaluation. No pulmonary embolism. Main pulmonary artery is normal in caliber. MEDIASTINUM: Coronary artery calcifications. Extensive mitral valve calcifications. Trace pericardial effusion. Aortic  atherosclerosis. There is no acute abnormality of the thoracic aorta. LYMPH NODES: No mediastinal or axillary lymphadenopathy. Prominent bilateral hilar lymph nodes are identified including a 1.2 cm left hilar lymph node, image 169/6, and a 1.1 cm right hilar lymph node, image 168/6. LUNGS AND PLEURA: The trachea appears patent. Filling defects within the right middle lobe bronchi identified. Asymmetric increased peribronchovascular soft tissue at the origin of the right middle lobe bronchus is identified. The increased soft tissue within this area measures approximately 2.3 x 1.8 cm, image 73/4 and coronal image 74/7. Scattered endoluminal filling defects of the proximal right middle lobe bronchus possibly reflecting aspirated debris or mucous plugging. Partial atelectasis of the right middle lobe is noted. Patchy area of geographic ground glass attenuation is noted within the posterobasal right upper lobe compatible with a nonspecific pneumonitis, axial image 56/5. Scattered areas of tree-in-bud nodularity noted within both lungs compatible with an inflammatory or infectious bronchiolitis. Focal area of subpleural focal scarring or atelectasis within the medial left apex is noted, axial image 20/5. No significant pleural effusion identified. No pneumothorax. UPPER ABDOMEN: Limited images of the upper abdomen demonstrate status post cholecystectomy. Bosniak class 1 cyst off the posterior cortex of the left kidney measures 2.1 cm. SOFT TISSUES AND BONES: No acute soft tissue abnormality. Multilevel degenerative disc disease and mild scoliosis within the thoracic spine. IMPRESSION: 1. No pulmonary embolism. 2. Partial right middle lobe atelectasis with asymmetric peribronchovascular soft tissue and endoluminal filling defects in the proximal right middle lobe bronchus, favored aspirated debris or mucous plugging. Follow-up imaging is advised to ensure resolution of the increased peribronchovascular soft tissue about  the proximal right middle lobe bronchus. Evaluation to exclude underlying malignancy. Specifically, repeat CT of the chest with contrast material in 1 month is advised following resolution of patient's symptoms. 3. Geographic ground-glass attenuation in the posterobasal right upper lobe, compatible with nonspecific pneumonitis. 4. Scattered tree-in-bud nodularity in both lungs, compatible with inflammatory or infectious bronchiolitis. 5. Prominent bilateral hilar lymph nodes. Favor reactive adenopathy in the setting of inflammation or infection. 6. Aortic atherosclerosis with coronary artery calcifications. Electronically signed by: Waddell Calk MD 10/11/2024 09:48 AM EST RP Workstation: [id number]   US  Venous Img Lower Bilateral Result Date: 10/11/2024 EXAM: ULTRASOUND DUPLEX OF THE BILATERAL LOWER EXTREMITY VEINS TECHNIQUE: Duplex ultrasound using B-mode/gray scaled imaging and Doppler spectral analysis and color flow was obtained of the deep venous structures of the bilateral lower extremity. COMPARISON: None available. CLINICAL HISTORY: Leg swelling Leg swelling FINDINGS: The common  femoral vein, femoral vein, popliteal vein, and posterior tibial vein of the _laterality_ lower extremity demonstrate normal compressibility with normal color flow and spectral analysis. IMPRESSION: 1. No evidence of DVT. Electronically signed by: Evalene Coho MD 10/11/2024 07:59 AM EST RP Workstation: [id number]   DG Chest 2 View Result Date: 10/11/2024 EXAM: 2 VIEW(S) XRAY OF THE CHEST 10/11/2024 06:43:30 AM COMPARISON: 10/08/2023 CLINICAL HISTORY: SOB FINDINGS: LUNGS AND PLEURA: Hyperinflation. Chronic interstitial prominence, unchanged. No pleural effusion. No pneumothorax. HEART AND MEDIASTINUM: No acute abnormality of the cardiac and mediastinal silhouettes. BONES AND SOFT TISSUES: No acute osseous abnormality. IMPRESSION: 1. No acute findings. 2. Hyperinflation and chronic interstitial prominence, unchanged.  Electronically signed by: Waddell Calk MD 10/11/2024 06:47 AM EST RP Workstation: [id number]   (Echo, Carotid, EGD, Colonoscopy, ERCP)    Subjective: Pt c/o fatigue    Discharge Exam: Vitals:   10/15/24 0500 10/15/24 0831  BP: (!) 151/73 (!) 135/95  Pulse: 63 70  Resp: 17 17  Temp: 98.4 F (36.9 C) 98.4 F (36.9 C)  SpO2: 91% 92%   Vitals:   10/14/24 2115 10/15/24 0000 10/15/24 0500 10/15/24 0831  BP: (!) 173/82 137/76 (!) 151/73 (!) 135/95  Pulse: 74 62 63 70  Resp: 18 17 17 17   Temp: 98.6 F (37 C)  98.4 F (36.9 C) 98.4 F (36.9 C)  TempSrc: Oral  Oral   SpO2: 96% 94% 91% 92%  Weight:      Height:        General: Pt is alert, awake, not in acute distress Cardiovascular:  S1/S2 +, no rubs, no gallops Respiratory: decreased breath sounds b/l bases  Abdominal: Soft, NT, obese, bowel sounds + Extremities: no cyanosis    The results of significant diagnostics from this hospitalization (including imaging, microbiology, ancillary and laboratory) are listed below for reference.     Microbiology: Recent Results (from the past 240 hours)  Resp panel by RT-PCR (RSV, Flu A&B, Covid) Anterior Nasal Swab     Status: None   Collection Time: 10/11/24  6:35 AM   Specimen: Anterior Nasal Swab  Result Value Ref Range Status   SARS Coronavirus 2 by RT PCR NEGATIVE NEGATIVE Final    Comment: (NOTE) SARS-CoV-2 target nucleic acids are NOT DETECTED.  The SARS-CoV-2 RNA is generally detectable in upper respiratory specimens during the acute phase of infection. The lowest concentration of SARS-CoV-2 viral copies this assay can detect is 138 copies/mL. A negative result does not preclude SARS-Cov-2 infection and should not be used as the sole basis for treatment or other patient management decisions. A negative result Foglio occur with  improper specimen collection/handling, submission of specimen other than nasopharyngeal swab, presence of viral mutation(s) within the areas  targeted by this assay, and inadequate number of viral copies(<138 copies/mL). A negative result must be combined with clinical observations, patient history, and epidemiological information. The expected result is Negative.  Fact Sheet for Patients:  bloggercourse.com  Fact Sheet for Healthcare Providers:  seriousbroker.it  This test is no t yet approved or cleared by the [place]  FDA and  has been authorized for detection and/or diagnosis of SARS-CoV-2 by FDA under an Emergency Use Authorization (EUA). This EUA will remain  in effect (meaning this test can be used) for the duration of the COVID-19 declaration under Section 564(b)(1) of the Act, 21 U.S.C.section 360bbb-3(b)(1), unless the authorization is terminated  or revoked sooner.       Influenza A by PCR NEGATIVE NEGATIVE Final   Influenza  B by PCR NEGATIVE NEGATIVE Final    Comment: (NOTE) The Xpert Xpress SARS-CoV-2/FLU/RSV plus assay is intended as an aid in the diagnosis of influenza from Nasopharyngeal swab specimens and should not be used as a sole basis for treatment. Nasal washings and aspirates are unacceptable for Xpert Xpress SARS-CoV-2/FLU/RSV testing.  Fact Sheet for Patients: bloggercourse.com  Fact Sheet for Healthcare Providers: seriousbroker.it  This test is not yet approved or cleared by the [place]  FDA and has been authorized for detection and/or diagnosis of SARS-CoV-2 by FDA under an Emergency Use Authorization (EUA). This EUA will remain in effect (meaning this test can be used) for the duration of the COVID-19 declaration under Section 564(b)(1) of the Act, 21 U.S.C. section 360bbb-3(b)(1), unless the authorization is terminated or revoked.     Resp Syncytial Virus by PCR NEGATIVE NEGATIVE Final    Comment: (NOTE) Fact Sheet for  Patients: bloggercourse.com  Fact Sheet for Healthcare Providers: seriousbroker.it  This test is not yet approved or cleared by the [place]  FDA and has been authorized for detection and/or diagnosis of SARS-CoV-2 by FDA under an Emergency Use Authorization (EUA). This EUA will remain in effect (meaning this test can be used) for the duration of the COVID-19 declaration under Section 564(b)(1) of the Act, 21 U.S.C. section 360bbb-3(b)(1), unless the authorization is terminated or revoked.  Performed at [facility], [address] [place], [place] [address]   Blood Culture (routine x 2)     Status: None (Preliminary result)   Collection Time: 10/11/24  6:35 AM   Specimen: BLOOD  Result Value Ref Range Status   Specimen Description BLOOD BLOOD RIGHT HAND  Final   Special Requests   Final    BOTTLES DRAWN AEROBIC AND ANAEROBIC Blood Culture adequate volume   Culture   Final    NO GROWTH 4 DAYS Performed at [facility], [address], [place], [place] [address]    Report Status PENDING  Incomplete  Blood Culture (routine x 2)     Status: None (Preliminary result)   Collection Time: 10/11/24  6:35 AM   Specimen: BLOOD  Result Value Ref Range Status   Specimen Description BLOOD RIGHT ANTECUBITAL  Final   Special Requests   Final    BOTTLES DRAWN AEROBIC AND ANAEROBIC Blood Culture adequate volume   Culture   Final    NO GROWTH 4 DAYS Performed at [facility], [address], [place], [place] [address]    Report Status PENDING  Incomplete  Expectorated Sputum Assessment w Gram Stain, Rflx to Resp Cult     Status: None   Collection Time: 10/11/24  9:35 AM   Specimen: Sputum  Result Value Ref Range Status   Specimen Description SPUTUM  Final   Special Requests NONE  Final   Sputum evaluation   Final    THIS SPECIMEN IS ACCEPTABLE FOR SPUTUM CULTURE Performed at [facility], [address], [place], [place] [address]    Report Status 10/11/2024 FINAL  Final  Culture, Respiratory w Gram Stain     Status: None   Collection Time: 10/11/24  9:35 AM   Specimen: SPU  Result Value Ref Range Status   Specimen Description   Final    SPUTUM Performed at [facility], [address] [place], [place] [address]    Special Requests   Final    NONE Reflexed from [id number] Performed at [facility], [address] [place], [place]  [address]    Gram Stain NO WBC SEEN FEW GRAM POSITIVE COCCI   Final   Culture   Final    ABUNDANT Normal respiratory flora-no Staph aureus or Pseudomonas seen Performed at [facility], [address], [place], [place] [address]    Report Status 10/13/2024 FINAL  Final     Labs: BNP (last 3 results) No results for input(s): BNP in the last 8760 hours. Basic Metabolic Panel: Recent Labs  Lab 10/11/24 0635 10/11/24 0935 10/12/24 0636 10/14/24 0438  NA 140  --  138 142  K 3.8  --  4.0 4.0  CL 103  --  102 104  CO2 24  --  25 26  GLUCOSE 111*  --  133* 95  BUN 13  --  14 31*  CREATININE 1.22* 1.17* 1.11* 1.27*  CALCIUM  8.9  --  9.0 8.8*   Liver Function Tests: Recent Labs  Lab 10/11/24 0635 10/12/24 0636  AST 25 34  ALT 19 18  ALKPHOS 62 54  BILITOT 0.2 0.2  PROT 6.8 6.3*  ALBUMIN 4.2 3.9   No results for input(s): LIPASE, AMYLASE in the last 168 hours. No results for input(s): AMMONIA in the last 168 hours. CBC: Recent Labs  Lab 10/11/24 0635 10/11/24 0935 10/12/24 0636  WBC 7.7 6.5 5.9  NEUTROABS 5.0  --   --   HGB 12.1 11.6* 11.1*  HCT 37.6 35.9* 34.3*  MCV 98.9 98.9 99.1  PLT 210 200 197   Cardiac Enzymes: No results for input(s): CKTOTAL, CKMB, CKMBINDEX, TROPONINI in the last 168 hours. BNP: Invalid input(s): POCBNP CBG: No results for input(s): GLUCAP in the last 168 hours. D-Dimer No results for input(s): DDIMER in the last 72 hours. Hgb A1c No  results for input(s): HGBA1C in the last 72 hours. Lipid Profile No results for input(s): CHOL, HDL, LDLCALC, TRIG, CHOLHDL, LDLDIRECT in the last 72 hours. Thyroid function studies No results for input(s): TSH, T4TOTAL, T3FREE, THYROIDAB in the last 72 hours.  Invalid input(s): FREET3 Anemia work up No results for input(s): VITAMINB12, FOLATE, FERRITIN, TIBC, IRON, RETICCTPCT in the last 72 hours. Urinalysis    Component Value Date/Time   COLORURINE YELLOW (A) 10/11/2024 0746   APPEARANCEUR HAZY (A) 10/11/2024 0746   APPEARANCEUR Clear 08/24/2016 1409   LABSPEC 1.010 10/11/2024 0746   PHURINE 5.0 10/11/2024 0746   GLUCOSEU 150 (A) 10/11/2024 0746   HGBUR SMALL (A) 10/11/2024 0746   BILIRUBINUR NEGATIVE 10/11/2024 0746   BILIRUBINUR Negative 08/24/2016 1409   KETONESUR NEGATIVE 10/11/2024 0746   PROTEINUR 100 (A) 10/11/2024 0746   NITRITE POSITIVE (A) 10/11/2024 0746   LEUKOCYTESUR TRACE (A) 10/11/2024 0746   Sepsis Labs Recent Labs  Lab 10/11/24 0635 10/11/24 0935 10/12/24 0636  WBC 7.7 6.5 5.9   Microbiology Recent Results (from the past 240 hours)  Resp panel by RT-PCR (RSV, Flu A&B, Covid) Anterior Nasal Swab     Status: None   Collection Time: 10/11/24  6:35 AM   Specimen: Anterior Nasal Swab  Result Value Ref Range Status   SARS Coronavirus 2 by RT PCR NEGATIVE NEGATIVE Final    Comment: (NOTE) SARS-CoV-2 target nucleic acids are NOT DETECTED.  The SARS-CoV-2 RNA is generally detectable in upper respiratory specimens during the acute phase of infection. The lowest concentration of SARS-CoV-2 viral copies this assay can detect is 138 copies/mL. A negative result does not preclude SARS-Cov-2 infection and should not be used as the sole basis for treatment  or other patient management decisions. A negative result Herbel occur with  improper specimen collection/handling, submission of specimen other than nasopharyngeal swab,  presence of viral mutation(s) within the areas targeted by this assay, and inadequate number of viral copies(<138 copies/mL). A negative result must be combined with clinical observations, patient history, and epidemiological information. The expected result is Negative.  Fact Sheet for Patients:  bloggercourse.com  Fact Sheet for Healthcare Providers:  seriousbroker.it  This test is no t yet approved or cleared by the [place]  FDA and  has been authorized for detection and/or diagnosis of SARS-CoV-2 by FDA under an Emergency Use Authorization (EUA). This EUA will remain  in effect (meaning this test can be used) for the duration of the COVID-19 declaration under Section 564(b)(1) of the Act, 21 U.S.C.section 360bbb-3(b)(1), unless the authorization is terminated  or revoked sooner.       Influenza A by PCR NEGATIVE NEGATIVE Final   Influenza B by PCR NEGATIVE NEGATIVE Final    Comment: (NOTE) The Xpert Xpress SARS-CoV-2/FLU/RSV plus assay is intended as an aid in the diagnosis of influenza from Nasopharyngeal swab specimens and should not be used as a sole basis for treatment. Nasal washings and aspirates are unacceptable for Xpert Xpress SARS-CoV-2/FLU/RSV testing.  Fact Sheet for Patients: bloggercourse.com  Fact Sheet for Healthcare Providers: seriousbroker.it  This test is not yet approved or cleared by the [place]  FDA and has been authorized for detection and/or diagnosis of SARS-CoV-2 by FDA under an Emergency Use Authorization (EUA). This EUA will remain in effect (meaning this test can be used) for the duration of the COVID-19 declaration under Section 564(b)(1) of the Act, 21 U.S.C. section 360bbb-3(b)(1), unless the authorization is terminated or revoked.     Resp Syncytial Virus by PCR NEGATIVE NEGATIVE Final    Comment: (NOTE) Fact Sheet for  Patients: bloggercourse.com  Fact Sheet for Healthcare Providers: seriousbroker.it  This test is not yet approved or cleared by the [place]  FDA and has been authorized for detection and/or diagnosis of SARS-CoV-2 by FDA under an Emergency Use Authorization (EUA). This EUA will remain in effect (meaning this test can be used) for the duration of the COVID-19 declaration under Section 564(b)(1) of the Act, 21 U.S.C. section 360bbb-3(b)(1), unless the authorization is terminated or revoked.  Performed at [facility], [address] [place], [place] [address]   Blood Culture (routine x 2)     Status: None (Preliminary result)   Collection Time: 10/11/24  6:35 AM   Specimen: BLOOD  Result Value Ref Range Status   Specimen Description BLOOD BLOOD RIGHT HAND  Final   Special Requests   Final    BOTTLES DRAWN AEROBIC AND ANAEROBIC Blood Culture adequate volume   Culture   Final    NO GROWTH 4 DAYS Performed at [facility], [address], [place], [place] [address]    Report Status PENDING  Incomplete  Blood Culture (routine x 2)     Status: None (Preliminary result)   Collection Time: 10/11/24  6:35 AM   Specimen: BLOOD  Result Value Ref Range Status   Specimen Description BLOOD RIGHT ANTECUBITAL  Final   Special Requests   Final    BOTTLES DRAWN AEROBIC AND ANAEROBIC Blood Culture adequate volume   Culture   Final    NO GROWTH 4 DAYS Performed at [facility], [address] [place], [place] [address]    Report Status PENDING  Incomplete  Expectorated Sputum Assessment  w Gram Stain, Rflx to Resp Cult     Status: None   Collection Time: 10/11/24  9:35 AM   Specimen: Sputum  Result Value Ref Range Status   Specimen Description SPUTUM  Final   Special Requests NONE  Final   Sputum evaluation   Final    THIS SPECIMEN IS ACCEPTABLE FOR SPUTUM CULTURE Performed at [facility], [address], [place], [place] [address]    Report Status 10/11/2024 FINAL  Final  Culture, Respiratory w Gram Stain     Status: None   Collection Time: 10/11/24  9:35 AM   Specimen: SPU  Result Value Ref Range Status   Specimen Description   Final    SPUTUM Performed at [facility], [address], [place], [place] [address]    Special Requests   Final    NONE Reflexed from [id number] Performed at [facility], [address] [place], [place] [address]    Gram Stain NO WBC SEEN FEW GRAM POSITIVE COCCI   Final   Culture   Final    ABUNDANT Normal respiratory flora-no Staph aureus or Pseudomonas seen Performed at [facility], [address], [place], [place] [address]    Report Status 10/13/2024 FINAL  Final     Time coordinating discharge: 35 minutes  SIGNED:   Anthony CHRISTELLA Pouch, MD  [facility] 10/15/2024, 1:46 PM Pager   If 7PM-7AM, please contact night-coverage [email or web address]

## 2024-10-15 NOTE — TOC Transition Note (Signed)
 Transition of Care [facility]) - Discharge Note   Patient Details  Name: Sherri Peters Weight MRN: [id number] Date of Birth: [birth year]  [facility]) CM/SW Contact:  Alfonso Rummer, LCSW Phone Number: 10/15/2024, 2:52 PM   Clinical Narrative:     Pt will discharge home with oxygen provided by [employer]. Pt reports sister will transport her home and was already contacted.   Final next level of care: Home/Self Care Barriers to Discharge: Continued Medical Work up   Patient Goals and CMS Choice     Choice offered to / list presented to : Patient      Discharge Placement                  Name of family member notified: Pt contacted sister Patient and family notified of of transfer: 10/15/24  Discharge Plan and Services Additional resources added to the After Visit Summary for                  DME Arranged: Oxygen DME Agency: [employer] Date DME Agency Contacted: 10/15/24   Representative spoke with at DME Agency: Thomasina [place]            Social Drivers of Health (SDOH) Interventions SDOH Screenings   Food Insecurity: Patient Declined (10/11/2024)  Housing: Low Risk  (10/11/2024)  Transportation Needs: No Transportation Needs (10/11/2024)  Utilities: Not At Risk (10/11/2024)  Financial Resource Strain: Medium Risk (04/13/2024)   Received from [facility]  Social Connections: Unknown (10/11/2024)  Tobacco Use: High Risk (10/11/2024)     Readmission Risk Interventions     No data to display

## 2024-10-16 LAB — CULTURE, BLOOD (ROUTINE X 2)
Culture: NO GROWTH
Culture: NO GROWTH
Special Requests: ADEQUATE
Special Requests: ADEQUATE

## 2024-11-07 ENCOUNTER — Other Ambulatory Visit: Payer: Self-pay | Admitting: Family Medicine

## 2024-11-07 DIAGNOSIS — J438 Other emphysema: Secondary | ICD-10-CM

## 2024-11-11 ENCOUNTER — Ambulatory Visit
Admission: RE | Admit: 2024-11-11 | Discharge: 2024-11-11 | Disposition: A | Discharge status: Home / Self Care | Source: Ambulatory Visit | Attending: Family Medicine | Admitting: Family Medicine

## 2024-11-11 DIAGNOSIS — J438 Other emphysema: Secondary | ICD-10-CM | POA: Diagnosis present

## 2024-11-15 NOTE — Progress Notes (Unsigned)
 [facility] [address] [place], [place] [address]  Pulmonary Sleep Medicine   Office Visit Note  Patient Name: Sherri Peters DOB: [birth year] MRN [id number]    Chief Complaint: Obstructive Sleep Apnea visit  Brief History:  Sherri Peters is seen today for a 4 month follow up on APAP @ 12-20 cmH2O. The patient has a 10 year history of sleep apnea. Patient is using PAP nightly.  The patient feels *** after sleeping with PAP.  The patient reports *** from PAP use. Reported sleepiness is  *** and the Epworth Sleepiness Score is *** out of 24. The patient *** take naps. The patient complains of the following: ***  The compliance download shows 96% compliance with an average use time of 8 hours 28 minutes. The AHI is 15.0  The patient *** of limb movements disrupting sleep.  ROS  General: (-) fever, (-) chills, (-) night sweat Nose and Sinuses: (-) nasal stuffiness or itchiness, (-) postnasal drip, (-) nosebleeds, (-) sinus trouble. Mouth and Throat: (-) sore throat, (-) hoarseness. Neck: (-) swollen glands, (-) enlarged thyroid, (-) neck pain. Respiratory: *** cough, *** shortness of breath, *** wheezing. Neurologic: *** numbness, *** tingling. Psychiatric: *** anxiety, *** depression   Current Medication: Outpatient Encounter Medications as of 11/17/2024  Medication Sig Note   acetaminophen  (TYLENOL ) 650 MG CR tablet Take 650 mg by mouth every 8 (eight) hours as needed for pain.    albuterol  (VENTOLIN  HFA) 108 (90 Base) MCG/ACT inhaler Inhale 2 puffs into the lungs every 6 (six) hours as needed for wheezing or shortness of breath.    B Complex-C (B-COMPLEX WITH VITAMIN C) tablet Take 1 tablet by mouth daily. (Patient not taking: Reported on 10/11/2024)    busPIRone  (BUSPAR ) 5 MG tablet Take 5 mg by mouth 2 (two) times daily as needed.    Cholecalciferol (VITAMIN D3) 50 MCG (2000 UT) capsule Take 2,000 Units by mouth daily.    citalopram  (CELEXA ) 20 MG tablet Take 20 mg by mouth  daily.    empagliflozin (JARDIANCE) 10 MG TABS tablet Take 10 mg by mouth daily. 10/11/2024: New medication not started PTA   ezetimibe (ZETIA) 10 MG tablet Take 10 mg by mouth daily. 10/11/2024: New medication not started PTA   levothyroxine  (SYNTHROID ) 75 MCG tablet Take 75 mcg by mouth daily before breakfast.    nicotine  (NICODERM CQ  - DOSED IN MG/24 HR) 7 mg/24hr patch Place 1 patch (7 mg total) onto the skin daily.    nicotine  polacrilex (NICOTINE  MINI) 4 MG lozenge Take 1 lozenge (4 mg total) by mouth as needed.    pantoprazole  (PROTONIX ) 40 MG tablet Take 1 tablet (40 mg total) by mouth 2 (two) times daily for 7 days.    predniSONE  (DELTASONE ) 10 MG tablet 50mg  daily x 2 days, 40mg  daily x 2 days, 30mg  daily x 2 days, 20mg  daily x 2 days, 10mg  daily x 2 days then stop    pregabalin  (LYRICA ) 100 MG capsule Take 1 capsule (100 mg total) by mouth 3 (three) times daily.    telmisartan -hydrochlorothiazide  (MICARDIS  HCT) 80-12.5 MG tablet TAKE 1 TABLET BY MOUTH  DAILY    No facility-administered encounter medications on file as of 11/17/2024.    Surgical History: Past Surgical History:  Procedure Laterality Date   ABDOMINAL HYSTERECTOMY     APPENDECTOMY N/A    CATARACT EXTRACTION Bilateral 06/2018   CHOLECYSTECTOMY N/A 12/08/2019   COLONOSCOPY     COLONOSCOPY WITH PROPOFOL  N/A 02/05/2024   Procedure:  COLONOSCOPY WITH PROPOFOL ;  Surgeon: Toledo, Ladell POUR, MD;  Location: [facility] ENDOSCOPY;  Service: Gastroenterology;  Laterality: N/A;  DM   ESOPHAGOGASTRODUODENOSCOPY (EGD) WITH PROPOFOL  N/A 02/05/2024   Procedure: ESOPHAGOGASTRODUODENOSCOPY (EGD) WITH PROPOFOL ;  Surgeon: Toledo, Ladell POUR, MD;  Location: [facility] ENDOSCOPY;  Service: Gastroenterology;  Laterality: N/A;   HEMOSTASIS CLIP PLACEMENT  02/05/2024   Procedure: CONTROL OF HEMORRHAGE, GI TRACT, ENDOSCOPIC, BY CLIPPING OR OVERSEWING;  Surgeon: Aundria, Ladell POUR, MD;  Location: [facility] ENDOSCOPY;  Service: Gastroenterology;;   KNEE ARTHROPLASTY Left  01/17/2023   Procedure: COMPUTER ASSISTED TOTAL KNEE ARTHROPLASTY;  Surgeon: Mardee Lynwood SQUIBB, MD;  Location: [facility] ORS;  Service: Orthopedics;  Laterality: Left;   POLYPECTOMY  02/05/2024   Procedure: POLYPECTOMY;  Surgeon: Toledo, Ladell POUR, MD;  Location: [facility] ENDOSCOPY;  Service: Gastroenterology;;   TONSILLECTOMY Bilateral    TOTAL KNEE ARTHROPLASTY Bilateral 06/03/2020   ARTHROPLASTY, KNEE, CONDYLE AND PLATEAU; MEDIAL AND LATERAL COMPARTMENTSWITH OR WITHOUT PATELLA RESURFACING (TOTAL KNEE ARTHROPLASTY); Surgeon: Sande Ozell Mt, MD; Location: [facility] OR; Service: Orthopedics; Laterality: Right;   UMBILICAL HERNIA REPAIR  11/10/2016   LAPAROSCOPY, SURGICAL; REPAIR, UMBILICAL HERNIA (INCLUDES MESH INSERTION, WHEN PERFORMED); REDUCIBLE - laparoscopic umbilical hernia repair. GETA; Surgeon: Ladora Hawthorn, MD; Location: [facility] OR; Service: General Surgery; Laterality: N/A    Medical History: Past Medical History:  Diagnosis Date   Aortic atherosclerosis    Arthritis    Bilateral carotid artery disease    Carpal tunnel syndrome of left wrist    CKD (chronic kidney disease), stage II    COPD (chronic obstructive pulmonary disease) (HCC)    Coronary artery disease    Depression    Diastolic dysfunction    a.) TTE 11/15/2018: EF >55%, mild LVH, MAC, BAE, triv PR, mild MR/TR, G1DD; b.) TTE 09/12/2021: EF >55%, mild LVH, MAC, triv TR/PR, mod MR, G1DD   Endophthalmitis, right    Fibromyalgia    GERD (gastroesophageal reflux disease)    History of bilateral cataract extraction 06/2018   Hyperlipidemia    Hypertension    Hypothyroidism    OSA on CPAP    Pre-diabetes    RLS (restless legs syndrome)    Statin intolerance    Umbilical hernia    a.) s/p repair 11/10/2016    Family History: Non contributory to the present illness  Social History: Social History   Socioeconomic History   Marital status: Divorced    Spouse name: Not on file   Number of  children: Not on file   Years of education: Not on file   Highest education level: Not on file  Occupational History   Not on file  Tobacco Use   Smoking status: Every Day    Current packs/day: 0.00    Average packs/day: 1.0 packs/day    Types: Cigarettes    Last attempt to quit: 07/20/2016    Years since quitting: 8.3   Smokeless tobacco: Never  Vaping Use   Vaping status: Never Used  Substance and Sexual Activity   Alcohol use: No   Drug use: No   Sexual activity: Yes  Other Topics Concern   Not on file  Social History Narrative   Not on file   Social Drivers of Health   Tobacco Use: High Risk (10/11/2024)   Patient History    Smoking Tobacco Use: Every Day    Smokeless Tobacco Use: Never    Passive Exposure: Not on file  Financial Resource Strain: Medium Risk (04/13/2024)   Received from [facility]   Overall Financial Resource Strain (CARDIA)    Difficulty of Paying Living Expenses: Somewhat hard  Food Insecurity: Patient Declined (10/11/2024)   Epic    Worried About Programme Researcher, Broadcasting/film/video in the Last Year: Patient declined    Barista in the Last Year: Patient declined  Transportation Needs: No Transportation Needs (10/11/2024)   Epic    Lack of Transportation (Medical): No    Lack of Transportation (Non-Medical): No  Physical Activity: Not on file  Stress: Not on file  Social Connections: Unknown (10/11/2024)   Social Connection and Isolation Panel    Frequency of Communication with Friends and Family: Three times a week    Frequency of Social Gatherings with Friends and Family: Twice a week    Attends Religious Services: Patient declined    Database Administrator or Organizations: Patient declined    Attends Banker Meetings: Patient declined    Marital Status: Patient declined  Intimate Partner Violence: Not At Risk (10/11/2024)   Epic    Fear of Current or Ex-Partner: No    Emotionally Abused: No    Physically Abused: No     Sexually Abused: No  Depression (PHQ2-9): Not on file  Alcohol Screen: Not on file  Housing: Low Risk (10/11/2024)   Epic    Unable to Pay for Housing in the Last Year: No    Number of Times Moved in the Last Year: 0    Homeless in the Last Year: No  Utilities: Not At Risk (10/11/2024)   Epic    Threatened with loss of utilities: No  Health Literacy: Not on file    Vital Signs: There were no vitals taken for this visit. There is no height or weight on file to calculate BMI.    Examination: General Appearance: The patient is well-developed, well-nourished, and in no distress. Neck Circumference: *** Skin: Gross inspection of skin unremarkable. Head: normocephalic, no gross deformities. Eyes: no gross deformities noted. ENT: ears appear grossly normal Neurologic: Alert and oriented. No involuntary movements.  STOP BANG RISK ASSESSMENT S (snore) Have you been told that you snore?     YES/NO   T (tired) Are you often tired, fatigued, or sleepy during the day?   YES/NO  O (obstruction) Do you stop breathing, choke, or gasp during sleep? YES/NO   P (pressure) Do you have or are you being treated for high blood pressure? YES/NO   B (BMI) Is your body index greater than 35 kg/m? YES/NO   A (age) Are you 32 years old or older? YES   N (neck) Do you have a neck circumference greater than 16 inches?   YES/NO   G (gender) Are you a female? NO   TOTAL STOP/BANG YES ANSWERS        A STOP-Bang score of 2 or less is considered low risk, and a score of 5 or more is high risk for having either moderate or severe OSA. For people who score 3 or 4, doctors Ekholm need to perform further assessment to determine how likely they are to have OSA.         EPWORTH SLEEPINESS SCALE:  Scale:  (0)= no chance of dozing; (1)= slight chance of dozing; (2)= moderate chance of dozing; (3)= high chance of dozing  Chance  Situtation    Sitting and reading: ***    Watching TV: ***    Sitting  Inactive in public: ***  As a passenger in car: ***      Lying down to rest: ***    Sitting and talking: ***    Sitting quielty after lunch: ***    In a car, stopped in traffic: ***   TOTAL SCORE:   *** out of 24    SLEEP STUDIES:  PSG (02/25/2015) AHI 14.6/hr, REM AHI 30.4/hr, min Sp02 84% Titration (03/04/2015) CPAP @ 7 cmH2O Titration (04/2023) BIPAP @ 20/16 cmH2O   CPAP COMPLIANCE DATA:  Date Range: 08/03/2024 - 11/13/2024  Average Daily Use: 8 hours 33 minutes  Median Use: 8 hours 44 minutes  Compliance for > 4 Hours: 96% days  AHI: 15.0 respiratory events per hour  Days Used: 102/103  Mask Leak: 73.5  95th Percentile Pressure: 16.3 cmH2O         LABS: Recent Results (from the past 2160 hours)  Blood gas, venous     Status: Abnormal   Collection Time: 10/11/24  6:32 AM  Result Value Ref Range   pH, Ven 7.41 7.25 - 7.43   pCO2, Ven 41 (L) 44 - 60 mmHg   pO2, Ven 57 (H) 32 - 45 mmHg   Bicarbonate 26.0 20.0 - 28.0 mmol/L   Acid-Base Excess 1.2 0.0 - 2.0 mmol/L   O2 Saturation 91.4 %   Patient temperature 37.0    Collection site VEIN     Comment: Performed at [facility], [address] [place], [place] [address]  Resp panel by RT-PCR (RSV, Flu A&B, Covid) Anterior Nasal Swab     Status: None   Collection Time: 10/11/24  6:35 AM   Specimen: Anterior Nasal Swab  Result Value Ref Range   SARS Coronavirus 2 by RT PCR NEGATIVE NEGATIVE    Comment: (NOTE) SARS-CoV-2 target nucleic acids are NOT DETECTED.  The SARS-CoV-2 RNA is generally detectable in upper respiratory specimens during the acute phase of infection. The lowest concentration of SARS-CoV-2 viral copies this assay can detect is 138 copies/mL. A negative result does not preclude SARS-Cov-2 infection and should not be used as the sole basis for treatment or other patient management decisions. A negative result [place] occur with  improper specimen collection/handling,  submission of specimen other than nasopharyngeal swab, presence of viral mutation(s) within the areas targeted by this assay, and inadequate number of viral copies(<138 copies/mL). A negative result must be combined with clinical observations, patient history, and epidemiological information. The expected result is Negative.  Fact Sheet for Patients:  bloggercourse.com  Fact Sheet for Healthcare Providers:  seriousbroker.it  This test is no t yet approved or cleared by the [place]  FDA and  has been authorized for detection and/or diagnosis of SARS-CoV-2 by FDA under an Emergency Use Authorization (EUA). This EUA will remain  in effect (meaning this test can be used) for the duration of the COVID-19 declaration under Section 564(b)(1) of the Act, 21 U.S.C.section 360bbb-3(b)(1), unless the authorization is terminated  or revoked sooner.       Influenza A by PCR NEGATIVE NEGATIVE   Influenza B by PCR NEGATIVE NEGATIVE    Comment: (NOTE) The Xpert Xpress SARS-CoV-2/FLU/RSV plus assay is intended as an aid in the diagnosis of influenza from Nasopharyngeal swab specimens and should not be used as a sole basis for treatment. Nasal washings and aspirates are unacceptable for Xpert Xpress SARS-CoV-2/FLU/RSV testing.  Fact Sheet for Patients: bloggercourse.com  Fact Sheet for Healthcare Providers: seriousbroker.it  This test is not yet approved or cleared by the [place]  [place] FDA and has been authorized for detection and/or diagnosis of SARS-CoV-2 by FDA under an Emergency Use Authorization (EUA). This EUA will remain in effect (meaning this test can be used) for the duration of the COVID-19 declaration under Section 564(b)(1) of the Act, 21 U.S.C. section 360bbb-3(b)(1), unless the authorization is terminated or revoked.     Resp Syncytial Virus by PCR NEGATIVE NEGATIVE     Comment: (NOTE) Fact Sheet for Patients: bloggercourse.com  Fact Sheet for Healthcare Providers: seriousbroker.it  This test is not yet approved or cleared by the [place]  FDA and has been authorized for detection and/or diagnosis of SARS-CoV-2 by FDA under an Emergency Use Authorization (EUA). This EUA will remain in effect (meaning this test can be used) for the duration of the COVID-19 declaration under Section 564(b)(1) of the Act, 21 U.S.C. section 360bbb-3(b)(1), unless the authorization is terminated or revoked.  Performed at [facility], [address] [place], [place] [address]   Lactic acid, plasma     Status: None   Collection Time: 10/11/24  6:35 AM  Result Value Ref Range   Lactic Acid, Venous 1.1 0.5 - 1.9 mmol/L    Comment: Performed at [facility], [address] [place], [place] [address]  Comprehensive metabolic panel     Status: Abnormal   Collection Time: 10/11/24  6:35 AM  Result Value Ref Range   Sodium 140 135 - 145 mmol/L   Potassium 3.8 3.5 - 5.1 mmol/L   Chloride 103 98 - 111 mmol/L   CO2 24 22 - 32 mmol/L   Glucose, Bld 111 (H) 70 - 99 mg/dL    Comment: Glucose reference range applies only to samples taken after fasting for at least 8 hours.   BUN 13 8 - 23 mg/dL   Creatinine, Ser 8.77 (H) 0.44 - 1.00 mg/dL   Calcium  8.9 8.9 - 10.3 mg/dL   Total Protein 6.8 6.5 - 8.1 g/dL   Albumin 4.2 3.5 - 5.0 g/dL   AST 25 15 - 41 U/L   ALT 19 0 - 44 U/L   Alkaline Phosphatase 62 38 - 126 U/L   Total Bilirubin 0.2 0.0 - 1.2 mg/dL   GFR, Estimated 46 (L) >60 mL/min    Comment: (NOTE) Calculated using the CKD-EPI Creatinine Equation (2021)    Anion gap 13 5 - 15    Comment: Performed at [facility], [address] [place], [place] [address]  CBC with Differential     Status: Abnormal   Collection Time: 10/11/24  6:35 AM  Result Value Ref Range   WBC 7.7 4.0 - 10.5  K/uL   RBC 3.80 (L) 3.87 - 5.11 MIL/uL   Hemoglobin 12.1 12.0 - 15.0 g/dL   HCT 62.3 63.9 - 53.9 %   MCV 98.9 80.0 - 100.0 fL   MCH 31.8 26.0 - 34.0 pg   MCHC 32.2 30.0 - 36.0 g/dL   RDW 85.9 88.4 - 84.4 %   Platelets 210 150 - 400 K/uL   nRBC 0.0 0.0 - 0.2 %   Neutrophils Relative % 66 %   Neutro Abs 5.0 1.7 - 7.7 K/uL   Lymphocytes Relative 21 %   Lymphs Abs 1.6 0.7 - 4.0 K/uL   Monocytes Relative 12 %   Monocytes Absolute 0.9 0.1 - 1.0 K/uL   Eosinophils Relative 0 %   Eosinophils Absolute 0.0 0.0 - 0.5 K/uL   Basophils Relative 0 %   Basophils Absolute 0.0 0.0 -  0.1 K/uL   Immature Granulocytes 1 %   Abs Immature Granulocytes 0.04 0.00 - 0.07 K/uL    Comment: Performed at [facility], [address] [place], [place] [address]  Protime-INR     Status: None   Collection Time: 10/11/24  6:35 AM  Result Value Ref Range   Prothrombin Time 13.3 11.4 - 15.2 seconds   INR 1.0 0.8 - 1.2    Comment: (NOTE) INR goal varies based on device and disease states. Performed at [facility], [address] [place], [place] [address]   Blood Culture (routine x 2)     Status: None   Collection Time: 10/11/24  6:35 AM   Specimen: BLOOD  Result Value Ref Range   Specimen Description BLOOD BLOOD RIGHT HAND    Special Requests      BOTTLES DRAWN AEROBIC AND ANAEROBIC Blood Culture adequate volume   Culture      NO GROWTH 5 DAYS Performed at [facility], [address] [place], [place] [address]    Report Status 10/16/2024 FINAL   Blood Culture (routine x 2)     Status: None   Collection Time: 10/11/24  6:35 AM   Specimen: BLOOD  Result Value Ref Range   Specimen Description BLOOD RIGHT ANTECUBITAL    Special Requests      BOTTLES DRAWN AEROBIC AND ANAEROBIC Blood Culture adequate volume   Culture      NO GROWTH 5 DAYS Performed at [facility], [address] [place], [place] [address]    Report Status 10/16/2024 FINAL   D-dimer,  quantitative     Status: Abnormal   Collection Time: 10/11/24  6:35 AM  Result Value Ref Range   D-Dimer, Quant 0.84 (H) 0.00 - 0.50 ug/mL-FEU    Comment: (NOTE) At the manufacturer cut-off value of 0.5 g/mL FEU, this assay has a negative predictive value of 95-100%.This assay is intended for use in conjunction with a clinical pretest probability (PTP) assessment model to exclude pulmonary embolism (PE) and deep venous thrombosis (DVT) in outpatients suspected of PE or DVT. Results should be correlated with clinical presentation. Performed at [facility], [address] [place], [place] [address]   Urinalysis, w/ Reflex to Culture (Infection Suspected) -Urine, Clean Catch     Status: Abnormal   Collection Time: 10/11/24  7:46 AM  Result Value Ref Range   Specimen Source URINE, CLEAN CATCH    Color, Urine YELLOW (A) YELLOW   APPearance HAZY (A) CLEAR   Specific Gravity, Urine 1.010 1.005 - 1.030   pH 5.0 5.0 - 8.0   Glucose, UA 150 (A) NEGATIVE mg/dL   Hgb urine dipstick SMALL (A) NEGATIVE   Bilirubin Urine NEGATIVE NEGATIVE   Ketones, ur NEGATIVE NEGATIVE mg/dL   Protein, ur 899 (A) NEGATIVE mg/dL   Nitrite POSITIVE (A) NEGATIVE   Leukocytes,Ua TRACE (A) NEGATIVE   RBC / HPF 0-5 0 - 5 RBC/hpf   WBC, UA 21-50 0 - 5 WBC/hpf    Comment:        Reflex urine culture not performed if WBC <=10, OR if Squamous epithelial cells >5. If Squamous epithelial cells >5 suggest recollection.    Bacteria, UA RARE (A) NONE SEEN   Squamous Epithelial / HPF 6-10 0 - 5 /HPF   WBC Clumps PRESENT    Mucus PRESENT    Non Squamous Epithelial PRESENT (A) NONE SEEN    Comment: Performed at [facility], [address] [place], [place] [address]  Expectorated Sputum Assessment w Gram Stain, Rflx to Resp Cult     Status: None   Collection Time: 10/11/24  9:35 AM   Specimen: Sputum  Result Value Ref Range   Specimen Description SPUTUM    Special Requests NONE    Sputum  evaluation      THIS SPECIMEN IS ACCEPTABLE FOR SPUTUM CULTURE Performed at [facility], [address] [place], [place] [address]    Report Status 10/11/2024 FINAL   CBC     Status: Abnormal   Collection Time: 10/11/24  9:35 AM  Result Value Ref Range   WBC 6.5 4.0 - 10.5 K/uL   RBC 3.63 (L) 3.87 - 5.11 MIL/uL   Hemoglobin 11.6 (L) 12.0 - 15.0 g/dL   HCT 64.0 (L) 63.9 - 53.9 %   MCV 98.9 80.0 - 100.0 fL   MCH 32.0 26.0 - 34.0 pg   MCHC 32.3 30.0 - 36.0 g/dL   RDW 85.9 88.4 - 84.4 %   Platelets 200 150 - 400 K/uL   nRBC 0.0 0.0 - 0.2 %    Comment: Performed at [facility], [address] [place], [place] [address]  Creatinine, serum     Status: Abnormal   Collection Time: 10/11/24  9:35 AM  Result Value Ref Range   Creatinine, Ser 1.17 (H) 0.44 - 1.00 mg/dL   GFR, Estimated 48 (L) >60 mL/min    Comment: (NOTE) Calculated using the CKD-EPI Creatinine Equation (2021) Performed at [facility], [address] [place], [place] [address]   Culture, Respiratory w Gram Stain     Status: None   Collection Time: 10/11/24  9:35 AM   Specimen: SPU  Result Value Ref Range   Specimen Description      SPUTUM Performed at [facility], [address], [place], [place] [address]    Special Requests      NONE Reflexed from [id number] Performed at [facility], [address] [place], [place] [address]    Gram Stain NO WBC SEEN FEW GRAM POSITIVE COCCI     Culture      ABUNDANT Normal respiratory flora-no Staph aureus or Pseudomonas seen Performed at [facility], [address], [place], [place] [address]    Report Status 10/13/2024 FINAL   TSH     Status: None   Collection Time: 10/11/24  3:26 PM  Result Value Ref Range   TSH 1.930 0.350 - 4.500 uIU/mL    Comment: Performed at [facility], [address] [place], [place] [address]  Comprehensive metabolic panel     Status: Abnormal   Collection Time: 10/12/24   6:36 AM  Result Value Ref Range   Sodium 138 135 - 145 mmol/L   Potassium 4.0 3.5 - 5.1 mmol/L   Chloride 102 98 - 111 mmol/L   CO2 25 22 - 32 mmol/L   Glucose, Bld 133 (H) 70 - 99 mg/dL    Comment: Glucose reference range applies only to samples taken after fasting for at least 8 hours.   BUN 14 8 - 23 mg/dL   Creatinine, Ser 8.88 (H) 0.44 - 1.00 mg/dL   Calcium  9.0 8.9 - 10.3 mg/dL   Total Protein 6.3 (L) 6.5 - 8.1 g/dL   Albumin 3.9 3.5 - 5.0 g/dL   AST 34 15 - 41 U/L   ALT 18 0 - 44 U/L   Alkaline Phosphatase 54 38 - 126 U/L  Total Bilirubin 0.2 0.0 - 1.2 mg/dL   GFR, Estimated 51 (L) >60 mL/min    Comment: (NOTE) Calculated using the CKD-EPI Creatinine Equation (2021)    Anion gap 12 5 - 15    Comment: Performed at [facility], [address] [place], [place] [address]  CBC     Status: Abnormal   Collection Time: 10/12/24  6:36 AM  Result Value Ref Range   WBC 5.9 4.0 - 10.5 K/uL   RBC 3.46 (L) 3.87 - 5.11 MIL/uL   Hemoglobin 11.1 (L) 12.0 - 15.0 g/dL   HCT 65.6 (L) 63.9 - 53.9 %   MCV 99.1 80.0 - 100.0 fL   MCH 32.1 26.0 - 34.0 pg   MCHC 32.4 30.0 - 36.0 g/dL   RDW 86.1 88.4 - 84.4 %   Platelets 197 150 - 400 K/uL   nRBC 0.0 0.0 - 0.2 %    Comment: Performed at [facility], [address] [place], [place] [address]  Protime-INR     Status: None   Collection Time: 10/12/24  6:36 AM  Result Value Ref Range   Prothrombin Time 13.7 11.4 - 15.2 seconds   INR 1.0 0.8 - 1.2    Comment: (NOTE) INR goal varies based on device and disease states. Performed at [facility], [address] [place], [place] [address]   Procalcitonin     Status: None   Collection Time: 10/14/24  4:38 AM  Result Value Ref Range   Procalcitonin <0.10 ng/mL    Comment: (NOTE)   Sepsis PCT Algorithm          Lower Respiratory Tract Infection                                         PCT  Algorithm -----------------------------------------------------------------  <0.5 ng/mL                    <0.10 ng/mL  Associated with low           Antibiotic therapy strongly   risk for progression          discouraged. Indicates absence   to severe sepsis              of bacteria infection  and/or septic shock             --------------------------------------------------------------  0.5-2.0 ng/mL                 0.10-0.25 ng/mL  Recommended to retest         Antibiotic therapy discouraged.  PCT within 6-24 hours         Bacterial infection unlikely  ------------------------------------------------------------  >2 ng/mL                      0.26-0.50 ng/mL  Associated with high risk     Antibiotic therapy encouraged.  for progression to severe     Bacterial infection possible  sepsis/and or septic shock    ------------------------------                                 >0.50 ng/mL  Antibiotic therapy strongly                                 encouraged.                                Suggestive of presence of                                 bacterial infection.                                 -------------------------------------------------------------------  < or = 0.50 ng/mL OR          < or = 0.25 OR 80% decrease in PCT  80% decrease in PCT           Antibiotic therapy   Antibiotic therapy [place]        Babilonia be discontinued  be discontinued                                 Performed at [facility], [address] [place], [place] [address]   Basic metabolic panel     Status: Abnormal   Collection Time: 10/14/24  4:38 AM  Result Value Ref Range   Sodium 142 135 - 145 mmol/L   Potassium 4.0 3.5 - 5.1 mmol/L   Chloride 104 98 - 111 mmol/L   CO2 26 22 - 32 mmol/L   Glucose, Bld 95 70 - 99 mg/dL    Comment: Glucose reference range applies only to samples taken after fasting for at least 8 hours.   BUN 31 (H) 8 - 23 mg/dL   Creatinine,  Ser 8.72 (H) 0.44 - 1.00 mg/dL   Calcium  8.8 (L) 8.9 - 10.3 mg/dL   GFR, Estimated 44 (L) >60 mL/min    Comment: (NOTE) Calculated using the CKD-EPI Creatinine Equation (2021)    Anion gap 12 5 - 15    Comment: Performed at [facility], [address] [place], [place] [address]    Radiology: CT CHEST WO CONTRAST Result Date: 11/14/2024 EXAM: CT CHEST WITHOUT CONTRAST 11/11/2024 01:23:54 PM TECHNIQUE: CT of the chest was performed without the administration of intravenous contrast. Multiplanar reformatted images are provided for review. Automated exposure control, iterative reconstruction, and/or weight based adjustment of the mA/kV was utilized to reduce the radiation dose to as low as reasonably achievable. COMPARISON: CTA chest dated 10/11/2024. CLINICAL HISTORY: SOB, follow-up CT chest FINDINGS: MEDIASTINUM: Heart and pericardium: Heart is unremarkable. Mitral valve annular calcifications. Moderate 3-vessel coronary atherosclerosis. Central airways: The central airways are clear. Vessels: Thoracic aortic atherosclerosis. LYMPH NODES: Small mediastinal nodes, including a dominant 8 mm subcarinal node, likely reactive. No hilar or axillary lymphadenopathy. LUNGS AND PLEURA: Prior peribronchovascular nodularity most prominent in the posterior upper lobe has resolved. There is new multifocal patchy opacities in the medial right middle lobe, right infrahilar right lower lobe, and lateral right lung base (image 112) suggesting multifocal pneumonia, possibly on the basis of aspiration. No pleural effusion or pneumothorax. SOFT TISSUES/BONES: Bones: Degenerative changes of the visualized thoracolumbar spine. Soft tissues: No acute abnormality of the soft tissues. UPPER ABDOMEN: Limited images of the upper  abdomen demonstrates no acute abnormality. IMPRESSION: 1. New multifocal patchy right lung opacities, suggesting multifocal pneumonia, possibly on the basis of aspiration. Electronically signed  by: Pinkie Pebbles MD MD 11/14/2024 08:44 PM EST RP Workstation: [id number]    CT CHEST WO CONTRAST Result Date: 11/14/2024 EXAM: CT CHEST WITHOUT CONTRAST 11/11/2024 01:23:54 PM TECHNIQUE: CT of the chest was performed without the administration of intravenous contrast. Multiplanar reformatted images are provided for review. Automated exposure control, iterative reconstruction, and/or weight based adjustment of the mA/kV was utilized to reduce the radiation dose to as low as reasonably achievable. COMPARISON: CTA chest dated 10/11/2024. CLINICAL HISTORY: SOB, follow-up CT chest FINDINGS: MEDIASTINUM: Heart and pericardium: Heart is unremarkable. Mitral valve annular calcifications. Moderate 3-vessel coronary atherosclerosis. Central airways: The central airways are clear. Vessels: Thoracic aortic atherosclerosis. LYMPH NODES: Small mediastinal nodes, including a dominant 8 mm subcarinal node, likely reactive. No hilar or axillary lymphadenopathy. LUNGS AND PLEURA: Prior peribronchovascular nodularity most prominent in the posterior upper lobe has resolved. There is new multifocal patchy opacities in the medial right middle lobe, right infrahilar right lower lobe, and lateral right lung base (image 112) suggesting multifocal pneumonia, possibly on the basis of aspiration. No pleural effusion or pneumothorax. SOFT TISSUES/BONES: Bones: Degenerative changes of the visualized thoracolumbar spine. Soft tissues: No acute abnormality of the soft tissues. UPPER ABDOMEN: Limited images of the upper abdomen demonstrates no acute abnormality. IMPRESSION: 1. New multifocal patchy right lung opacities, suggesting multifocal pneumonia, possibly on the basis of aspiration. Electronically signed by: Pinkie Pebbles MD MD 11/14/2024 08:44 PM EST RP Workstation: [id number]    CT CHEST WO CONTRAST Result Date: 11/14/2024 EXAM: CT CHEST WITHOUT CONTRAST 11/11/2024 01:23:54 PM TECHNIQUE: CT of the chest was performed without  the administration of intravenous contrast. Multiplanar reformatted images are provided for review. Automated exposure control, iterative reconstruction, and/or weight based adjustment of the mA/kV was utilized to reduce the radiation dose to as low as reasonably achievable. COMPARISON: CTA chest dated 10/11/2024. CLINICAL HISTORY: SOB, follow-up CT chest FINDINGS: MEDIASTINUM: Heart and pericardium: Heart is unremarkable. Mitral valve annular calcifications. Moderate 3-vessel coronary atherosclerosis. Central airways: The central airways are clear. Vessels: Thoracic aortic atherosclerosis. LYMPH NODES: Small mediastinal nodes, including a dominant 8 mm subcarinal node, likely reactive. No hilar or axillary lymphadenopathy. LUNGS AND PLEURA: Prior peribronchovascular nodularity most prominent in the posterior upper lobe has resolved. There is new multifocal patchy opacities in the medial right middle lobe, right infrahilar right lower lobe, and lateral right lung base (image 112) suggesting multifocal pneumonia, possibly on the basis of aspiration. No pleural effusion or pneumothorax. SOFT TISSUES/BONES: Bones: Degenerative changes of the visualized thoracolumbar spine. Soft tissues: No acute abnormality of the soft tissues. UPPER ABDOMEN: Limited images of the upper abdomen demonstrates no acute abnormality. IMPRESSION: 1. New multifocal patchy right lung opacities, suggesting multifocal pneumonia, possibly on the basis of aspiration. Electronically signed by: Pinkie Pebbles MD MD 11/14/2024 08:44 PM EST RP Workstation: [id number]      Assessment and Plan: Patient Active Problem List   Diagnosis Date Noted   COPD exacerbation (HCC) 10/11/2024   Sepsis secondary to UTI (HCC) 10/11/2024   Hyponatremia 10/09/2023   Metabolic acidosis 10/09/2023   Gastroenteritis 10/08/2023   Total knee replacement status 01/17/2023   Degeneration of lumbar intervertebral disc 12/16/2022   Scoliosis of lumbar spine  12/16/2022   Primary osteoarthritis of left knee 12/02/2022   Severe obesity (BMI 35.0-39.9) with comorbidity (HCC) 12/02/2022   Arthritis of right hip 07/04/2022  Low back pain 07/04/2022   Sacroiliac joint pain 07/04/2022   Arthropathy of lumbar facet joint 07/04/2022   Lumbar radiculopathy 07/04/2022   Lumbar spondylosis 07/04/2022   Spondylolisthesis, lumbar region 07/04/2022   Prediabetes 05/26/2022   Primary hypertension 05/15/2022   Coronary artery disease involving native coronary artery of native heart 08/18/2021   Fibromyalgia 05/05/2021   CPAP use counseling 03/14/2021   Secondary hyperparathyroidism 02/15/2021   Tobacco use 02/15/2021   Primary osteoarthritis of both first carpometacarpal joints 11/09/2019   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/18/2019   Intractable nausea and vomiting 06/02/2019   Bilateral carotid artery stenosis 11/22/2018   SOBOE (shortness of breath on exertion) 11/22/2018   Endophthalmitis, right eye 07/09/2018   Pseudophakia of right eye 07/09/2018   Current moderate episode of major depressive disorder without prior episode (HCC) 01/02/2018   Primary osteoarthritis of right knee 12/07/2017   Chronic kidney disease, stage 3a (HCC) 11/22/2017   Gastroesophageal reflux disease without esophagitis 11/22/2017   Obesity (BMI 35.0-39.9 without comorbidity) 11/22/2017   Other emphysema (HCC) 11/22/2017   Age-related osteoporosis without current pathological fracture 10/18/2016   Bilateral chronic knee pain 10/18/2016   Chronic thumb pain, bilateral 10/18/2016   Carpal tunnel syndrome of left wrist 06/07/2016   Restless leg syndrome 12/14/2015   Arthritis of knee 12/14/2015   Shingles 08/23/2015   OSA on CPAP 05/18/2015   Hyperlipidemia    Hypothyroid    Hypertension    Depression       The patient *** tolerate PAP and reports *** benefit from PAP use. The patient was reminded how to *** and advised to ***. The patient was also  counselled on ***. The compliance is ***. The AHI is ***.   ***  General Counseling: I have discussed the findings of the evaluation and examination with Sherri Peters.  I have also discussed any further diagnostic evaluation thatmay be needed or ordered today. Sherri Peters verbalizes understanding of the findings of todays visit. We also reviewed her medications today and discussed drug interactions and side effects including but not limited excessive drowsiness and altered mental states. We also discussed that there is always a risk not just to her but also people around her. she has been encouraged to call the office with any questions or concerns that should arise related to todays visit.  No orders of the defined types were placed in this encounter.       I have personally obtained a history, examined the patient, evaluated laboratory and imaging results, formulated the assessment and plan and placed orders.  Sherri DELENA Bathe, MD [facility] Diplomate ABMS Pulmonary Critical Care Medicine and Sleep Medicine

## 2024-11-17 ENCOUNTER — Ambulatory Visit
